# Patient Record
Sex: Male | Born: 1960 | Race: Black or African American | Hispanic: No | Marital: Single | Smoking: Never smoker
Health system: Southern US, Community
[De-identification: ages and names within clinical notes are randomized; demographics above are authoritative.]

## PROBLEM LIST (undated history)

## (undated) DIAGNOSIS — M86159 Other acute osteomyelitis, unspecified femur: Secondary | ICD-10-CM

## (undated) DIAGNOSIS — I38 Endocarditis, valve unspecified: Secondary | ICD-10-CM

## (undated) DIAGNOSIS — E119 Type 2 diabetes mellitus without complications: Secondary | ICD-10-CM

## (undated) DIAGNOSIS — I639 Cerebral infarction, unspecified: Secondary | ICD-10-CM

## (undated) DIAGNOSIS — I10 Essential (primary) hypertension: Secondary | ICD-10-CM

## (undated) DIAGNOSIS — T8459XA Infection and inflammatory reaction due to other internal joint prosthesis, initial encounter: Secondary | ICD-10-CM

## (undated) DIAGNOSIS — Z96649 Presence of unspecified artificial hip joint: Secondary | ICD-10-CM

## (undated) HISTORY — DX: Other acute osteomyelitis, unspecified femur: M86.159

## (undated) HISTORY — DX: Presence of unspecified artificial hip joint: Z96.649

## (undated) HISTORY — PX: NO PAST SURGERIES: SHX2092

## (undated) HISTORY — DX: Cerebral infarction, unspecified: I63.9

## (undated) HISTORY — DX: Infection and inflammatory reaction due to other internal joint prosthesis, initial encounter: T84.59XA

---

## 1998-04-02 ENCOUNTER — Emergency Department (HOSPITAL_COMMUNITY): Admission: EM | Admit: 1998-04-02 | Discharge: 1998-04-02 | Payer: Self-pay | Admitting: Emergency Medicine

## 1999-10-20 ENCOUNTER — Encounter: Payer: Self-pay | Admitting: Emergency Medicine

## 1999-10-20 ENCOUNTER — Emergency Department (HOSPITAL_COMMUNITY): Admission: EM | Admit: 1999-10-20 | Discharge: 1999-10-20 | Payer: Self-pay | Admitting: Emergency Medicine

## 2004-08-31 ENCOUNTER — Emergency Department (HOSPITAL_COMMUNITY): Admission: EM | Admit: 2004-08-31 | Discharge: 2004-08-31 | Payer: Self-pay | Admitting: Emergency Medicine

## 2005-09-20 ENCOUNTER — Encounter: Payer: Self-pay | Admitting: Emergency Medicine

## 2011-01-22 ENCOUNTER — Emergency Department (HOSPITAL_COMMUNITY)
Admission: EM | Admit: 2011-01-22 | Discharge: 2011-01-22 | Discharge status: Against Medical Advice | Payer: Self-pay | Attending: Emergency Medicine | Admitting: Emergency Medicine

## 2011-01-23 ENCOUNTER — Emergency Department (HOSPITAL_COMMUNITY)
Admission: EM | Admit: 2011-01-23 | Discharge: 2011-01-23 | Disposition: A | Discharge status: Home / Self Care | Payer: Self-pay | Attending: Emergency Medicine | Admitting: Emergency Medicine

## 2011-01-23 DIAGNOSIS — N471 Phimosis: Secondary | ICD-10-CM | POA: Insufficient documentation

## 2011-01-23 DIAGNOSIS — N509 Disorder of male genital organs, unspecified: Secondary | ICD-10-CM | POA: Insufficient documentation

## 2011-01-23 DIAGNOSIS — L293 Anogenital pruritus, unspecified: Secondary | ICD-10-CM | POA: Insufficient documentation

## 2013-04-07 ENCOUNTER — Emergency Department (HOSPITAL_COMMUNITY)
Admission: EM | Admit: 2013-04-07 | Discharge: 2013-04-08 | Discharge status: Against Medical Advice | Payer: Self-pay | Attending: Emergency Medicine | Admitting: Emergency Medicine

## 2013-04-07 DIAGNOSIS — Z049 Encounter for examination and observation for unspecified reason: Secondary | ICD-10-CM | POA: Insufficient documentation

## 2013-04-07 NOTE — ED Notes (Signed)
Called x 2 for triage, no answer 

## 2013-04-07 NOTE — ED Notes (Signed)
Called x1 for triage, no answer.

## 2014-02-22 ENCOUNTER — Encounter (HOSPITAL_COMMUNITY): Payer: Self-pay | Admitting: Emergency Medicine

## 2014-02-22 DIAGNOSIS — E119 Type 2 diabetes mellitus without complications: Secondary | ICD-10-CM | POA: Insufficient documentation

## 2014-02-22 DIAGNOSIS — Z79899 Other long term (current) drug therapy: Secondary | ICD-10-CM | POA: Insufficient documentation

## 2014-02-22 DIAGNOSIS — I10 Essential (primary) hypertension: Secondary | ICD-10-CM | POA: Insufficient documentation

## 2014-02-22 LAB — CBG MONITORING, ED: Glucose-Capillary: 175 mg/dL — ABNORMAL HIGH (ref 70–99)

## 2014-02-22 NOTE — ED Notes (Signed)
Pt reports he has been out of medications for 2 weeks. Pt c/o headache and sts he thinks his blood sugar is elevated.

## 2014-02-23 ENCOUNTER — Emergency Department (HOSPITAL_COMMUNITY)
Admission: EM | Admit: 2014-02-23 | Discharge: 2014-02-23 | Disposition: A | Discharge status: Home / Self Care | Payer: Self-pay | Attending: Emergency Medicine | Admitting: Emergency Medicine

## 2014-02-23 DIAGNOSIS — I10 Essential (primary) hypertension: Secondary | ICD-10-CM

## 2014-02-23 DIAGNOSIS — E119 Type 2 diabetes mellitus without complications: Secondary | ICD-10-CM

## 2014-02-23 HISTORY — DX: Type 2 diabetes mellitus without complications: E11.9

## 2014-02-23 HISTORY — DX: Essential (primary) hypertension: I10

## 2014-02-23 LAB — BASIC METABOLIC PANEL
Anion gap: 13 (ref 5–15)
BUN: 23 mg/dL (ref 6–23)
CO2: 25 mEq/L (ref 19–32)
Calcium: 8.7 mg/dL (ref 8.4–10.5)
Chloride: 101 mEq/L (ref 96–112)
Creatinine, Ser: 1.02 mg/dL (ref 0.50–1.35)
GFR calc Af Amer: >90 mL/min (ref >90)
GFR calc non Af Amer: 83 mL/min — ABNORMAL LOW (ref >90)
Glucose, Bld: 232 mg/dL — ABNORMAL HIGH (ref 70–99)
Potassium: 3.8 mEq/L (ref 3.7–5.3)
Sodium: 139 mEq/L (ref 137–147)

## 2014-02-23 LAB — CBC WITH DIFFERENTIAL/PLATELET
Basophils Absolute: 0 10*3/uL (ref 0.0–0.1)
Basophils Relative: 1 % (ref 0–1)
Eosinophils Absolute: 0.1 10*3/uL (ref 0.0–0.7)
Eosinophils Relative: 1 % (ref 0–5)
HCT: 38.5 % — ABNORMAL LOW (ref 39.0–52.0)
Hemoglobin: 12.6 g/dL — ABNORMAL LOW (ref 13.0–17.0)
Lymphocytes Relative: 20 % (ref 12–46)
Lymphs Abs: 0.9 10*3/uL (ref 0.7–4.0)
MCH: 27 pg (ref 26.0–34.0)
MCHC: 32.7 g/dL (ref 30.0–36.0)
MCV: 82.6 fL (ref 78.0–100.0)
Monocytes Absolute: 0.9 10*3/uL (ref 0.1–1.0)
Monocytes Relative: 19 % — ABNORMAL HIGH (ref 3–12)
Neutro Abs: 2.7 10*3/uL (ref 1.7–7.7)
Neutrophils Relative %: 59 % (ref 43–77)
Platelets: 179 10*3/uL (ref 150–400)
RBC: 4.66 MIL/uL (ref 4.22–5.81)
RDW: 13.8 % (ref 11.5–15.5)
WBC: 4.6 10*3/uL (ref 4.0–10.5)

## 2014-02-23 MED ORDER — METFORMIN HCL 500 MG PO TABS: 500.0000 mg | ORAL_TABLET | Freq: Two times a day (BID) | ORAL | Status: DC

## 2014-02-23 MED ORDER — LISINOPRIL 5 MG PO TABS: 20.0000 mg | ORAL_TABLET | Freq: Every day | ORAL | Status: DC

## 2014-02-23 MED ORDER — HYDROCHLOROTHIAZIDE 25 MG PO TABS: 25.0000 mg | ORAL_TABLET | Freq: Every day | ORAL | Status: DC

## 2014-02-23 NOTE — Discharge Instructions (Signed)
°Emergency Department Resource Guide °1) Find a Doctor and Pay Out of Pocket °Although you won't have to find out who is covered by your insurance plan, it is a good idea to ask around and get recommendations. You will then need to call the office and see if the doctor you have chosen will accept you as a new patient and what types of options they offer for patients who are self-pay. Some doctors offer discounts or will set up payment plans for their patients who do not have insurance, but you will need to ask so you aren't surprised when you get to your appointment. ° °2) Contact Your Local Health Department °Not all health departments have doctors that can see patients for sick visits, but many do, so it is worth a call to see if yours does. If you don't know where your local health department is, you can check in your phone book. The CDC also has a tool to help you locate your state's health department, and many state websites also have listings of all of their local health departments. ° °3) Find a Walk-in Clinic °If your illness is not likely to be very severe or complicated, you may want to try a walk in clinic. These are popping up all over the country in pharmacies, drugstores, and shopping centers. They're usually staffed by nurse practitioners or physician assistants that have been trained to treat common illnesses and complaints. They're usually fairly quick and inexpensive. However, if you have serious medical issues or chronic medical problems, these are probably not your best option. ° °No Primary Care Doctor: °- Call [facility] at  832-8000 - they can help you locate a primary care doctor that  accepts your insurance, provides certain services, etc. °- Physician Referral Service- 1-800-533-3463 ° °Chronic Pain Problems: °Organization         Address  Phone   Notes  °[facility]  (336) 297-2271 Patients need to be referred by their primary care doctor.  ° °Medication  Assistance: °Organization         Address  Phone   Notes  °[place] [facility] [address] °[place], [place] [address] (336) 641-8030 --Must be a resident of [place] °-- Must have NO insurance coverage whatsoever (no Medicaid/ Medicare, etc.) °-- The pt. MUST have a primary care doctor that directs their care regularly and follows them in the community °  °[facility]  (866) 331-1348   °[employer]  (888) 892-1162   ° °Agencies that provide inexpensive medical care: °Organization         Address  Phone   Notes  °[facility]  (336) 832-8035   °[facility]    (336) 832-7272   °[facility] [address] °[place], [place] [address] (336) 832-4777   °[facility] [address], °[place] (336) 271-4999   °[facility]    (336) 373-0678   °[facility]    (336) 272-1050   °[facility] ° [address], [place] Phone:  (336) 832-4444, Fax:  (336) 832-4440 Hours of Operation:  9 am - 6 pm, M-F.  Also accepts Medicaid/Medicare and self-pay.  °[facility] ° [address], [place] Phone: (336) 832-3150, Fax: (336) 832-3151. Hours of Operation:  8:30 am - 5:30 pm, M-F.  Also accepts Medicaid and self-pay.  °[facility] [place] [address], [place] Phone: (336) 878-6027   °[facility] [address], [place], [place] (336)723-1848, Ext. 123 Mondays & Thursdays: 7-9 AM.  First 15 patients are seen on a first come, first serve basis. °  ° °Medicaid-accepting [place] Providers: ° °Organization         Address  Phone   Notes  °[facility] [address], [place] (336) 641-2100 Also accepts self-pay patients.  °[facility] [address], [place] ° (336) 856-9996   °[facility] [address], [place]  (336) 288-8857   °[facility] [address] [place] [address], [place] (336) 299-7000   °Veita Bland [address], [place]  ° (336) 373-1557 Only accepts [place] Access Medicaid patients after they have their name applied to their card.  ° °Self-Pay (no insurance) in [place]: ° °Organization         Address  Phone   Notes  °Sickle Cell Patients, [facility] [address], [place] (336) 832-1970   °[facility] [address], [place] (336) 832-4400   °[facility] ° [address], [place] (336) 992-4800   °[facility]/Dr. Osei-Bonsu ° [address] [place] [address], [place] or [address], [place] (336) 841-8500 Phone number for both [place] and [place] locations is the same.  °[facility] [address], [place] (336) 299-0000   °[facility] [address] [place] [address], [place] or [address] (336) 852-7530 °(336) 878-2260   °[facility] [address], [place] (336) 350-1642, phone; (336) 294-5005, fax Sees patients 1st and 3rd Saturday of every month.  Must not qualify for public or private insurance (i.e. Medicaid, Medicare, [place] Health Choice, Veterans' Benefits) • Household income should be no more than 200% of the poverty level •The clinic cannot treat you if you are pregnant or think you are pregnant • Sexually transmitted diseases are not treated at the clinic.  ° ° °Dental Care: °Organization         Address  Phone  Notes  °[place] [facility] [address], [place] (336) 641-6152 Accepts children up to age 21 who are enrolled in Medicaid or [place] Health Choice; pregnant women with a Medicaid card; and children who have applied for Medicaid or [place] Health Choice, but were declined, whose parents can pay a reduced fee at time of service.  °[place]  [facility] [place]  [address], [place] (336) 641-7733 Accepts children up to age 21 who are enrolled in Medicaid or [place] Health Choice; pregnant women with a Medicaid card; and children who have applied for Medicaid or [place] Health Choice, but were declined, whose parents can pay a reduced fee at time of service.  °[facility] ° [address], [place] (336) 641-4533 Patients are seen by appointment only. Walk-ins are not accepted. [facility] will see patients 18 years of age and older. °Monday - Tuesday (8am-5pm) °Most Wednesdays (8:30-5pm) °$30 per visit, cash only  °[facility] ° [address], [place] (336) 641-4533 Patients are seen by appointment only. Walk-ins are not accepted. [facility] will see patients 18 years of age and older. °One   Wednesday Evening (Monthly: Volunteer Based).  $30 per visit, cash only  °[facility]  (919) 537-3737 for adults; Children under age 4, call Graduate Pediatric Dentistry at ([phone]) [phone]. Children aged 4-14, please call ([phone]) [phone] to request a pediatric application. ° Dental services are provided in all areas of dental care including fillings, crowns and bridges, complete and partial dentures, implants, gum treatment, root canals, and extractions. Preventive care is also provided. Treatment is provided to both adults and children. °Patients are selected via a lottery and there is often a waiting list. °  °[facility] [address], °[place] ° (336) 763-8833 [email or web address] °  °[facility] [address], [place], [place] (336)723-1848, Ext. 123 Second and Fourth Thursday of each month, opens at 6:30 AM; Clinic ends at 9 AM.  Patients are seen on a first-come first-served basis, and a limited number are seen during each clinic.  ° °[facility] ° [address], [place], [place] (336) 723-7904    Eligibility Requirements °You must have lived in [place], [place], or [place] counties for at least the last three months. °  You cannot be eligible for state or federal sponsored healthcare insurance, including Veterans Administration, Medicaid, or Medicare. °  You generally cannot be eligible for healthcare insurance through your employer.  °  How to apply: °Eligibility screenings are held every Tuesday and Wednesday afternoon from 1:00 pm until 4:00 pm. You do not need an appointment for the interview!  °[facility] [address], [place], [place] 336-631-2330   °[facility]  336-342-8273   °[facility]  336-703-3100   °[facility]  336-570-6415   ° °Behavioral Health Resources in the Community: °Intensive Outpatient Programs °Organization         Address  Phone  Notes  °[facility] [address], [place], [place] 336-878-6098   °[facility] [address], [place], [place] 336-832-9800   °[facility] [address], [place], [place] ° 336-882-2125   °[facility] [address],  °[place], [place] 1-800-853-5163 or 336-641-4981   °Substance Abuse Resources °Organization         Address  Phone  Notes  °[facility]  336-882-2125   °[facility]  336-784-9470   °[facility]  336-285-9073   °[facility]  336-845-3988   °Residential & Outpatient Substance Abuse Program  1-800-659-3381   °Psychological Services °Organization         Address  Phone  Notes  °[facility]  336- 832-9600   °[facility]  336- 378-7881   °[facility] [address], [place] 1-800-853-5163 or 336-641-4981   ° °Mobile Crisis Teams °Organization         Address  Phone  Notes  °[facility], Mobile Crisis Care Unit  1-877-626-1772   °Assertive °[facility] ° [address]  [place], [place] 336-834-9664   °Sharon DeEsch [address] °[place] 336-554-5454   ° °Self-Help/Support Groups °Organization         Address  Phone             Notes  °[facility] of  - variety of support groups  336- 373-1402 Call for more information  °Narcotics Anonymous (NA), [facility] [address], °[place]  2 meetings at this location  ° °  Residential Treatment Programs Organization         Address  Phone  Notes  [facility] [address],    [place]  [phone]   [facility]  [address], [place] [id number], [place], [place] [phone]   [facility] [address], [place] [phone] Admissions: 8am-3pm M-F  [facility] [address]    [place], [place] [phone]   [facility] [address], [place], [place] [phone]   [facility] [address]  [place], [place] [phone]   [facility] Intensive Outpatient [address], [place], [place] [phone]   [facility] ([facility] [address]  [place], [place] [phone] or [phone]   [facility] [address] [place], [place] [phone] Accepts Medicaid  [facility] [address]  [place] [phone]) [phone] Substance Abuse/Addiction Treatment   [facility] Organization         Address  Phone  Notes  [facility]  [phone](888) 862-320-1889   Angie FavaJulie Brannon, PhD [address], [address] [place], [place]   ([phone](336) [phone] or [phone](336) ([phone]   [facility]   [address] [place], [place] [phone](336) [phone]   [facility] [address], [place], [place] (718)[phone](336) [phone] Insurance/Medicaid/sponsorship through [facility] [address]                                    [place], [place] [phone](336) [phone] Therapy/tele-psych/case    [facility] [address]   [place], [place] [phone](336) [phone]    Dr. Lolly MustacheArfeen  [phone]) [phone]   [facility] [place] [facility]  [employer] [facility] 1) [address], [place] 2) [address], [place] 3)  [address], [place] [phone](336) ([phone](336) ([phone]  [phone](336) [phone]   [facility] Child Abuse Hotline [phone](336) [phone] or [phone]) [phone] (After Hours)       DASH Eating Plan DASH stands for "Dietary Approaches to Stop Hypertension." The DASH eating plan is a healthy eating plan that has been shown to reduce high blood pressure (hypertension). Additional health benefits may include reducing the risk of type 2 diabetes mellitus, heart disease, and stroke. The DASH eating plan may also help with weight loss. WHAT DO I NEED TO KNOW ABOUT THE DASH EATING PLAN? For the DASH eating plan, you will follow these general guidelines:  Choose foods with a percent daily value for sodium of less than 5% (as listed on the food label).  Use salt-free seasonings or herbs instead of table salt or sea salt.  Check with your health care provider or pharmacist before using salt substitutes.  Eat lower-sodium products, often labeled as "lower sodium" or "no salt added."  Eat fresh foods.  Eat more vegetables, fruits, and low-fat dairy products.  Choose whole grains. Look for the word "whole" as the first word in the ingredient list.  Choose fish and skinless chicken or [place] more often than red meat. Limit fish, poultry, and meat to 6 oz (170 g) each day.  Limit sweets, desserts, sugars, and sugary drinks.  Choose heart-healthy fats.  Limit cheese to 1 oz (28 g) per day.  Eat more home-cooked food and less restaurant, buffet, and fast food.  Limit fried foods.  Cook foods using methods other than frying.  Limit canned vegetables. If you do use them, rinse them well to decrease the sodium.  When eating at a restaurant, ask that your food be prepared with  less salt, or no salt if possible. WHAT FOODS CAN I EAT? Seek help from a dietitian for individual calorie needs. Grains Whole grain or whole wheat bread. Brown rice. Whole grain or whole wheat pasta. Quinoa, bulgur, and whole grain cereals. Low-sodium cereals. Corn or whole wheat flour tortillas. Whole grain cornbread. Whole grain crackers. Low-sodium crackers. Vegetables Fresh or frozen vegetables (raw, steamed, roasted, or grilled). Low-sodium or reduced-sodium tomato and vegetable juices. Low-sodium or reduced-sodium tomato sauce and paste. Low-sodium or reduced-sodium canned vegetables.  Fruits All fresh, canned (in natural juice), or frozen fruits. Meat and Other Protein Products Ground beef (85% or leaner), grass-fed beef, or beef trimmed of fat. Skinless chicken or [place]. Ground chicken or [place]. Pork trimmed of fat. All fish and seafood. Eggs. Dried beans, peas, or lentils. Unsalted nuts and seeds. Unsalted canned beans. Dairy Low-fat dairy products, such as skim or 1% milk, 2% or reduced-fat cheeses, low-fat ricotta or cottage cheese, or plain low-fat yogurt. Low-sodium or reduced-sodium cheeses. Fats and Oils Tub margarines without trans fats. Light or reduced-fat mayonnaise and salad dressings (reduced sodium). Avocado. Safflower, olive, or canola oils. Natural peanut or almond butter. Other Unsalted popcorn and pretzels. The items listed above may not be a complete list of recommended foods or beverages. Contact your dietitian for more options. WHAT FOODS ARE NOT RECOMMENDED? Grains White bread. White pasta. White rice. Refined cornbread. Bagels and croissants. Crackers that contain trans fat. Vegetables Creamed or fried vegetables. Vegetables in a cheese sauce. Regular canned vegetables. Regular canned tomato sauce and paste. Regular tomato and vegetable juices. Fruits Dried fruits. Canned fruit in light or heavy syrup. Fruit juice. Meat and Other Protein Products Fatty cuts  of meat. Ribs, chicken wings, bacon, sausage, bologna, salami, chitterlings, fatback, hot dogs, bratwurst, and packaged luncheon meats. Salted nuts and seeds. Canned beans with salt. Dairy Whole or 2% milk, cream, half-and-half, and cream cheese. Whole-fat or sweetened yogurt. Full-fat cheeses or blue cheese. Nondairy creamers and whipped toppings. Processed cheese, cheese spreads, or cheese curds. Condiments Onion and garlic salt, seasoned salt, table salt, and sea salt. Canned and packaged gravies. Worcestershire sauce. Tartar sauce. Barbecue sauce. Teriyaki sauce. Soy sauce, including reduced sodium. Steak sauce. Fish sauce. Oyster sauce. Cocktail sauce. Horseradish. Ketchup and mustard. Meat flavorings and tenderizers. Bouillon cubes. Hot sauce. Tabasco sauce. Marinades. Taco seasonings. Relishes. Fats and Oils Butter, stick margarine, lard, shortening, ghee, and bacon fat. Coconut, palm kernel, or palm oils. Regular salad dressings. Other Pickles and olives. Salted popcorn and pretzels. The items listed above may not be a complete list of foods and beverages to avoid. Contact your dietitian for more information. WHERE CAN I FIND MORE INFORMATION? National Heart, Lung, and Blood Institute: [employer] Document Released: 04/27/2011 Document Revised: 09/22/2013 Document Reviewed: 03/12/2013 [facility] Patient Information 2015 [place], [place]. This information is not intended to replace advice given to you by your health care provider. Make sure you discuss any questions you have with your health care provider.

## 2014-02-23 NOTE — ED Notes (Signed)
Pt states that he is unable to urinate.   

## 2014-02-23 NOTE — ED Notes (Signed)
The pt has had a headache for one week.  He has  Nor had any bp or diabetic meds for one month

## 2014-03-04 NOTE — ED Provider Notes (Signed)
CSN: [id number]     Arrival date & time 02/22/14  2316 History   First MD Initiated Contact with Patient 02/23/14 0044     Chief Complaint  Patient presents with  . Headache     (Consider location/radiation/quality/duration/timing/severity/associated sxs/prior Treatment) Patient is a 53 y.o. male presenting with headaches. The history is provided by the patient.  Headache Pain location:  Generalized Quality:  Dull Radiates to:  Does not radiate Onset quality:  Unable to specify Duration:  2 weeks Timing:  Intermittent Progression:  Waxing and waning Context: not activity, not exposure to bright light, not coughing and not loud noise   Associated symptoms: no blurred vision, no dizziness, no pain, no fever, no myalgias, no nausea, no neck pain, no neck stiffness, no numbness, no paresthesias, no photophobia, no seizures, no tingling and no vomiting   Risk factors: no anger     53yM with Ha. Gradual onset 2 weeks ago. No neuro complaints otherwise. Feels may be related to being out of bp and dm meds fro several weeks.   Past Medical History  Diagnosis Date  . Diabetes mellitus without complication   . Hypertension    History reviewed. No pertinent past surgical history. No family history on file. History  Substance Use Topics  . Smoking status: Former Games developermoker  . Smokeless tobacco: Not on file  . Alcohol Use: Yes    Review of Systems  Constitutional: Negative for fever.  Eyes: Negative for blurred vision, photophobia and pain.  Gastrointestinal: Negative for nausea and vomiting.  Musculoskeletal: Negative for myalgias, neck pain and neck stiffness.  Neurological: Positive for headaches. Negative for dizziness, seizures, numbness and paresthesias.    All systems reviewed and negative, other than as noted in HPI.   Allergies  Review of patient's allergies indicates no known allergies.  Home Medications   Prior to Admission medications   Medication Sig Start Date End  Date Taking? Authorizing Provider  hydrochlorothiazide (HYDRODIURIL) 25 MG tablet Take 1 tablet (25 mg total) by mouth daily. 02/23/14   Raeford RazorStephen Novie Maggio, MD  lisinopril (PRINIVIL,ZESTRIL) 5 MG tablet Take 4 tablets (20 mg total) by mouth daily. 02/23/14   Raeford RazorStephen Paitynn Mikus, MD  metFORMIN (GLUCOPHAGE) 500 MG tablet Take 1 tablet (500 mg total) by mouth 2 (two) times daily with a meal. 02/23/14   Raeford RazorStephen Joevanni Roddey, MD   BP 146/80  Pulse 97  Temp(Src) 98.3 F (36.8 C) (Oral)  Resp 20  SpO2 99% Physical Exam  Nursing note and vitals reviewed. Constitutional: He is oriented to person, place, and time. He appears well-developed and well-nourished. No distress.  HENT:  Head: Normocephalic and atraumatic.  Eyes: Conjunctivae are normal. Right eye exhibits no discharge. Left eye exhibits no discharge.  Neck: Neck supple.  No nuchal rigidty  Cardiovascular: Normal rate, regular rhythm and normal heart sounds.  Exam reveals no gallop and no friction rub.   No murmur heard. Pulmonary/Chest: Effort normal and breath sounds normal. No respiratory distress.  Abdominal: Soft. He exhibits no distension. There is no tenderness.  Musculoskeletal: He exhibits no edema and no tenderness.  Neurological: He is alert and oriented to person, place, and time. No cranial nerve deficit. He exhibits normal muscle tone. Coordination normal.  normal appearing gait  Skin: Skin is warm and dry.  Psychiatric: He has a normal mood and affect. His behavior is normal. Thought content normal.    ED Course  Procedures (including critical care time) Labs Review Labs Reviewed  BASIC METABOLIC PANEL -  Abnormal; Notable for the following:    Glucose, Bld 232 (*)    GFR calc non Af Amer 83 (*)    All other components within normal limits  CBC WITH DIFFERENTIAL - Abnormal; Notable for the following:    Hemoglobin 12.6 (*)    HCT 38.5 (*)    Monocytes Relative 19 (*)    All other components within normal limits  CBG MONITORING, ED  - Abnormal; Notable for the following:    Glucose-Capillary 175 (*)    All other components within normal limits    Imaging Review No results found.   EKG Interpretation   Date/Time:  Monday February 23 2014 02:15:31 EDT Ventricular Rate:  109 PR Interval:  150 QRS Duration: 78 QT Interval:  314 QTC Calculation: 423 R Axis:   82 Text Interpretation:  Sinus tachycardia Baseline wander in lead(s) V3 ED  PHYSICIAN INTERPRETATION AVAILABLE IN [facility] Confirmed by TEST,  Record ([id number]) on 02/25/2014 6:54:17 AM      MDM   Final diagnoses:  Essential hypertension  Type 2 diabetes mellitus without complication    53yM with mild HA. Suspect primary HA. Consider emergent secondary causes such as bleed, infectious or mass but doubt. There is no history of trauma. Pt has a nonfocal neurological exam. Afebrile and neck supple. No use of blood thinning medication. Consider ocular etiology such as acute angle closure glaucoma but doubt. Pt denies acute change in visual acuity and eye exam unremarkable. Doubt temporal arteritis given age, no temporal tenderness and temporal artery pulsations palpable. Doubt CO poisoning. No contacts with similar symptoms. Doubt venous thrombosis. Doubt carotid or vertebral arteries dissection. Feel that can be safely discharged, but strict return precautions discussed as well as stressing importance of continued routine medical care. Prescribed BP meds.  Outpt fu.     Raeford RazorStephen Reco Shonk, MD 03/04/14 [phone]

## 2014-03-05 ENCOUNTER — Encounter (HOSPITAL_COMMUNITY): Payer: Self-pay | Admitting: Emergency Medicine

## 2014-03-05 DIAGNOSIS — R000 Tachycardia, unspecified: Secondary | ICD-10-CM | POA: Insufficient documentation

## 2014-03-05 DIAGNOSIS — R569 Unspecified convulsions: Secondary | ICD-10-CM | POA: Insufficient documentation

## 2014-03-05 DIAGNOSIS — I10 Essential (primary) hypertension: Secondary | ICD-10-CM | POA: Insufficient documentation

## 2014-03-05 DIAGNOSIS — Z87891 Personal history of nicotine dependence: Secondary | ICD-10-CM | POA: Insufficient documentation

## 2014-03-05 DIAGNOSIS — I639 Cerebral infarction, unspecified: Secondary | ICD-10-CM | POA: Insufficient documentation

## 2014-03-05 DIAGNOSIS — E119 Type 2 diabetes mellitus without complications: Secondary | ICD-10-CM | POA: Insufficient documentation

## 2014-03-05 NOTE — ED Notes (Signed)
Pt. reports headache with blurred vision and mild dizziness onset today . Alert and oriented/ respirations unlabored /ambulatory.

## 2014-03-06 ENCOUNTER — Emergency Department (HOSPITAL_COMMUNITY): Payer: Self-pay

## 2014-03-06 ENCOUNTER — Encounter (HOSPITAL_COMMUNITY): Payer: Self-pay | Admitting: Radiology

## 2014-03-06 ENCOUNTER — Emergency Department (HOSPITAL_COMMUNITY)
Admission: EM | Admit: 2014-03-06 | Discharge: 2014-03-06 | Discharge status: Against Medical Advice | Payer: Self-pay | Attending: Emergency Medicine | Admitting: Emergency Medicine

## 2014-03-06 DIAGNOSIS — R253 Fasciculation: Secondary | ICD-10-CM

## 2014-03-06 DIAGNOSIS — H538 Other visual disturbances: Secondary | ICD-10-CM

## 2014-03-06 DIAGNOSIS — I639 Cerebral infarction, unspecified: Secondary | ICD-10-CM

## 2014-03-06 DIAGNOSIS — R569 Unspecified convulsions: Secondary | ICD-10-CM

## 2014-03-06 DIAGNOSIS — R51 Headache: Secondary | ICD-10-CM

## 2014-03-06 DIAGNOSIS — I10 Essential (primary) hypertension: Secondary | ICD-10-CM

## 2014-03-06 LAB — RAPID URINE DRUG SCREEN, HOSP PERFORMED
Amphetamines: NOT DETECTED
Barbiturates: NOT DETECTED
Benzodiazepines: NOT DETECTED
Cocaine: NOT DETECTED
Opiates: NOT DETECTED
TETRAHYDROCANNABINOL: NOT DETECTED

## 2014-03-06 LAB — BASIC METABOLIC PANEL
ANION GAP: 11 (ref 5–15)
BUN: 21 mg/dL (ref 6–23)
CHLORIDE: 99 meq/L (ref 96–112)
CO2: 28 meq/L (ref 19–32)
Calcium: 9.4 mg/dL (ref 8.4–10.5)
Creatinine, Ser: 1.1 mg/dL (ref 0.50–1.35)
GFR calc Af Amer: 88 mL/min — ABNORMAL LOW (ref >90)
GFR calc non Af Amer: 76 mL/min — ABNORMAL LOW (ref >90)
Glucose, Bld: 227 mg/dL — ABNORMAL HIGH (ref 70–99)
Potassium: 3.9 mEq/L (ref 3.7–5.3)
Sodium: 138 mEq/L (ref 137–147)

## 2014-03-06 LAB — CBC WITH DIFFERENTIAL/PLATELET
Basophils Absolute: 0.2 10*3/uL — ABNORMAL HIGH (ref 0.0–0.1)
Basophils Relative: 2 % — ABNORMAL HIGH (ref 0–1)
EOS PCT: 2 % (ref 0–5)
Eosinophils Absolute: 0.2 10*3/uL (ref 0.0–0.7)
HCT: 35.8 % — ABNORMAL LOW (ref 39.0–52.0)
HEMOGLOBIN: 11.8 g/dL — AB (ref 13.0–17.0)
Lymphocytes Relative: 23 % (ref 12–46)
Lymphs Abs: 2.6 10*3/uL (ref 0.7–4.0)
MCH: 27.8 pg (ref 26.0–34.0)
MCHC: 33 g/dL (ref 30.0–36.0)
MCV: 84.2 fL (ref 78.0–100.0)
MONO ABS: 1.3 10*3/uL — AB (ref 0.1–1.0)
Monocytes Relative: 12 % (ref 3–12)
NEUTROS PCT: 61 % (ref 43–77)
Neutro Abs: 6.9 10*3/uL (ref 1.7–7.7)
Platelets: 296 10*3/uL (ref 150–400)
RBC: 4.25 MIL/uL (ref 4.22–5.81)
RDW: 13.9 % (ref 11.5–15.5)
WBC: 11.2 10*3/uL — ABNORMAL HIGH (ref 4.0–10.5)

## 2014-03-06 LAB — URINALYSIS, ROUTINE W REFLEX MICROSCOPIC
Bilirubin Urine: NEGATIVE
Glucose, UA: >1000 mg/dL — AB
Ketones, ur: NEGATIVE mg/dL
LEUKOCYTES UA: NEGATIVE
NITRITE: NEGATIVE
PH: 5.5 (ref 5.0–8.0)
Protein, ur: NEGATIVE mg/dL
SPECIFIC GRAVITY, URINE: 1.027 (ref 1.005–1.030)
Urobilinogen, UA: 1 mg/dL (ref 0.0–1.0)

## 2014-03-06 LAB — URINE MICROSCOPIC-ADD ON

## 2014-03-06 LAB — ETHANOL: Alcohol, Ethyl (B): <11 mg/dL (ref 0–11)

## 2014-03-06 MED ORDER — ASPIRIN 81 MG PO CHEW: 324.0000 mg | CHEWABLE_TABLET | Freq: Every day | ORAL | Status: DC

## 2014-03-06 MED ORDER — LORAZEPAM 2 MG/ML IJ SOLN
INTRAMUSCULAR | Status: DC
Start: 2014-03-06 — End: 2014-03-06
  Filled 2014-03-06: qty 1

## 2014-03-06 MED ORDER — SODIUM CHLORIDE 0.9 % IV BOLUS (SEPSIS)
1000.0000 mL | Freq: Once | INTRAVENOUS | Status: AC
  Administered 2014-03-06: 1000 mL via INTRAVENOUS

## 2014-03-06 MED ORDER — LEVETIRACETAM 500 MG PO TABS: 500.0000 mg | ORAL_TABLET | Freq: Two times a day (BID) | ORAL | Status: DC

## 2014-03-06 MED ORDER — LABETALOL HCL 5 MG/ML IV SOLN
10.0000 mg | Freq: Once | INTRAVENOUS | Status: AC
  Administered 2014-03-06: 10 mg via INTRAVENOUS
  Filled 2014-03-06: qty 4

## 2014-03-06 MED ORDER — LEVETIRACETAM IN NACL 1000 MG/100ML IV SOLN
1000.0000 mg | Freq: Once | INTRAVENOUS | Status: AC
  Administered 2014-03-06: 1000 mg via INTRAVENOUS
  Filled 2014-03-06: qty 100

## 2014-03-06 MED ORDER — GADOBENATE DIMEGLUMINE 529 MG/ML IV SOLN
20.0000 mL | Freq: Once | INTRAVENOUS | Status: AC
  Administered 2014-03-06: 18 mL via INTRAVENOUS

## 2014-03-06 MED ORDER — LORAZEPAM 2 MG/ML IJ SOLN
1.0000 mg | Freq: Once | INTRAMUSCULAR | Status: AC
  Administered 2014-03-06: 1 mg via INTRAVENOUS

## 2014-03-06 NOTE — ED Notes (Signed)
MD at bedside. 

## 2014-03-06 NOTE — Discharge Instructions (Signed)
Take your BP meds as prescribed.   Take ASA 325 mg daily.   Take keppra as prescribed.   [facility].   Return to ER if you have seizures, worse headaches, weakness.

## 2014-03-06 NOTE — Progress Notes (Signed)
  CARE MANAGEMENT ED NOTE 03/06/2014  Patient:  Marc [name],Marc [name]   Account Number:  [id number]  Date Initiated:  03/06/2014  Documentation initiated by:  Ferdinand CavaSCHETTINO,Aleka Twitty  Subjective/Objective Assessment:   53 yo male presenting to the ED with c/o headache     Subjective/Objective Assessment Detail:     Action/Plan:   Patient [facility] and prescription refills if unable to access the system today he will contacte this CM [facility]   Action/Plan Detail:   Anticipated DC Date:       Status Recommendation to Physician:   Result of Recommendation:  Agreed    DC Planning Services  CM consult  Medication Assistance  PCP issues    Choice offered to / List presented to:            Status of service:  Completed, signed off  ED Comments:   ED Comments Detail:  CM consulted to assist with medications. This CM spoke with the patient regarding ED visit. He stated he was having headaches and then while he was in the ED they informed him that he had a seizure and are going to start him on medications. The patient stated he was released from prison in July and has not had medical care since. The patient confirmed his homeless status, no income, and no current medical provider. The patient is [facility]. This CM spoke with the patient [facility] and discussed receiving medical care there and then [facility]. This CM also [facility] with the patient. The patient stated that when he gets discharged he will [facility] to bee seen by the NP and get his medications filled for free. This CM provided contact information that if the patient [facility] today he is to call and then he will [facility] to [facility] his initial medications and he states that he has the $9 copay. The patient discussed his alcohol use with this CM  and we discussed treatment options and this CM provided the patient with a resource guide for treatment centers. The patient verbalized understanding and had no other questions or concerns and appreciative of assistance. ED staff RN and EDP aware of plan and information provided.

## 2014-03-06 NOTE — ED Provider Notes (Signed)
CSN: [id number]     Arrival date & time 03/05/14  2314 History   First MD Initiated Contact with Patient 03/06/14 0002     Chief Complaint  Patient presents with  . Headache     (Consider location/radiation/quality/duration/timing/severity/associated sxs/prior Treatment) Patient is a 53 y.o. male presenting with headaches.  Headache Pain location:  Generalized Quality:  Dull Radiates to:  Does not radiate Onset quality:  Gradual Duration:  1 day Timing:  Constant Progression:  Unchanged Chronicity:  Recurrent Similar to prior headaches: yes   Context comment:  With L eye blurring Relieved by:  Nothing Worsened by:  Nothing tried Ineffective treatments:  None tried Associated symptoms: no abdominal pain, no loss of balance, no numbness, no sinus pressure and no weakness     Past Medical History  Diagnosis Date  . Diabetes mellitus without complication   . Hypertension    History reviewed. No pertinent past surgical history. No family history on file. History  Substance Use Topics  . Smoking status: Former Games developermoker  . Smokeless tobacco: Not on file  . Alcohol Use: Yes    Review of Systems  HENT: Negative for sinus pressure.   Gastrointestinal: Negative for abdominal pain.  Neurological: Positive for headaches. Negative for numbness and loss of balance.  All other systems reviewed and are negative.     Allergies  Review of patient's allergies indicates no known allergies.  Home Medications   Prior to Admission medications   Medication Sig Start Date End Date Taking? Authorizing Provider  hydrochlorothiazide (HYDRODIURIL) 25 MG tablet Take 1 tablet (25 mg total) by mouth daily. 02/23/14   Raeford RazorStephen Kohut, MD  lisinopril (PRINIVIL,ZESTRIL) 5 MG tablet Take 4 tablets (20 mg total) by mouth daily. 02/23/14   Raeford RazorStephen Kohut, MD  metFORMIN (GLUCOPHAGE) 500 MG tablet Take 1 tablet (500 mg total) by mouth 2 (two) times daily with a meal. 02/23/14   Raeford RazorStephen Kohut, MD   BP  104/60  Pulse 97  Temp(Src) 99.6 F (37.6 C) (Oral)  Resp 14  Ht 5\' 10"  (1.778 m)  Wt 190 lb (86.183 kg)  BMI 27.26 kg/m2  SpO2 85% Physical Exam  Vitals reviewed. Constitutional: He is oriented to person, place, and time. He appears well-developed and well-nourished.  HENT:  Head: Normocephalic and atraumatic.  Eyes: Conjunctivae and EOM are normal.  Neck: Normal range of motion. Neck supple.  Cardiovascular: Regular rhythm and normal heart sounds.  Tachycardia present.   Pulmonary/Chest: Effort normal and breath sounds normal. No respiratory distress.  Abdominal: He exhibits no distension. There is no tenderness. There is no rebound and no guarding.  Musculoskeletal: Normal range of motion.  Neurological: He is alert and oriented to person, place, and time. He has normal strength and normal reflexes. No cranial nerve deficit or sensory deficit. Gait normal. GCS eye subscore is 4. GCS verbal subscore is 5. GCS motor subscore is 6.  Skin: Skin is warm and dry.    ED Course  Procedures (including critical care time) Labs Review Labs Reviewed  CBC WITH DIFFERENTIAL - Abnormal; Notable for the following:    WBC 11.2 (*)    Hemoglobin 11.8 (*)    HCT 35.8 (*)    Basophils Relative 2 (*)    Monocytes Absolute 1.3 (*)    Basophils Absolute 0.2 (*)    All other components within normal limits  BASIC METABOLIC PANEL - Abnormal; Notable for the following:    Glucose, Bld 227 (*)    GFR calc  non Af Amer 76 (*)    GFR calc Af Amer 88 (*)    All other components within normal limits  URINALYSIS, ROUTINE W REFLEX MICROSCOPIC - Abnormal; Notable for the following:    Glucose, UA >1000 (*)    Hgb urine dipstick MODERATE (*)    All other components within normal limits  URINE MICROSCOPIC-ADD ON  ETHANOL  URINE RAPID DRUG SCREEN (HOSP PERFORMED)    Imaging Review Dg Chest 2 View  03/06/2014   CLINICAL DATA:  Chest pain and elevated blood pressure.  EXAM: CHEST  2 VIEW  COMPARISON:   None.  FINDINGS: Normal heart size and mediastinal contours. No acute infiltrate or edema. No effusion or pneumothorax. No acute osseous findings.  IMPRESSION: No active cardiopulmonary disease.   Electronically Signed   By: Tiburcio PeaJonathan  Watts M.D.   On: 03/06/2014 01:43   Ct Head Wo Contrast  03/06/2014   CLINICAL DATA:  Elevated blood pressure with headache. Initial encounter.  EXAM: CT HEAD WITHOUT CONTRAST  TECHNIQUE: Contiguous axial images were obtained from the base of the skull through the vertex without intravenous contrast.  COMPARISON:  None.  FINDINGS: The examination is degraded secondary to patient motion necessitating the acquisition of additional images.  Bilateral basal ganglial calcifications. Gray-white differentiation is otherwise well maintained given limitations of the examination. No CT evidence of acute large territory infarct. No intraparenchymal or extra-axial mass or hemorrhage. Normal size and configuration of the ventricles and basilar cisterns. No midline shift.  Mucosal thickening involving the bilateral frontal, anterior and posterior ethmoidal air cells. Polypoid mucosal thickening of the right maxillary. Normal aeration of the bilateral mastoid air cells. No air-fluid levels. No definite displaced calvarial fracture. Regional soft tissues appear normal.  IMPRESSION: 1. Degraded examination without definite acute intracranial process. 2. Sinus disease as above.  No air-fluid levels.   Electronically Signed   By: Simonne ComeJohn  Watts M.D.   On: 03/06/2014 02:01     EKG Interpretation   Date/Time:  Friday March 06 2014 00:40:45 EDT Ventricular Rate:  109 PR Interval:  155 QRS Duration: 81 QT Interval:  322 QTC Calculation: 434 R Axis:   56 Text Interpretation:  Sinus tachycardia No significant change was found  Confirmed by Mirian MoGentry, Matthew [phone]([id number]) on 03/06/2014 1:10:50 AM      MDM   Final diagnoses:  None    53 y.o. male with pertinent PMH of DM, HTN presents with  recurrent ha and new onset L eye blurring earlier this morning lasting approximately 30 minutes which is now resolved.  He presents with a complaint of request for blood pressure medications, despite having a prescription for same.  On arrival vital signs and physical exam as above, significant for hypertension.  No focal neurodeficits on initial exam.  Pt asymptomatic at time of exam.    Pt went to CT scan, there had a brief (<1 min) episode of unresponsiveness and L sided shaking.  L sided tremor persisted after resolution of alertness and throughout the rest of his visit.  He also had L sided weakness.  Consulted neurology and pt was to be admitted, however he refused.  He was to obtain MRI per neurology recs.  Keppra 1 gm given with plan to dc home with 500mg  keppra bid.  Pt care to Dr. Silverio LayYao pending MRI  No diagnosis found.      Mirian MoMatthew Gentry, MD 03/06/14 [phone]

## 2014-03-06 NOTE — ED Provider Notes (Signed)
  Physical Exam  BP 143/91  Pulse 97  Temp(Src) 99.6 F (37.6 C) (Oral)  Resp 14  Ht 5\' 10"  (1.778 m)  Wt 190 lb (86.183 kg)  BMI 27.26 kg/m2  SpO2 100%  Physical Exam  ED Course  Procedures  Care assumed at sign out from Dr. Littie DeedsGentry. Patient has been having headache and blurry vision and possible seizure activity. Loaded with keppra. Neuro was consulted, wanted MRI. MRI showed possible acute stroke. Dr. Amada JupiterKirkpatrick feels that its likely to be post seizure changes but still wants to start ASA. He also wants to admit for seizure and stroke workup. However, patient adamant that he wants to go home. He understands the risks including disability and death. Will AMA patient. Will start keppra, ASA, BP meds.       Richardean Canalavid H Debbrah Sampedro, MD 03/06/14 [phone]

## 2014-03-06 NOTE — Progress Notes (Signed)
This CM received phone message form the patient that [facility] medication assistance after the ED discharge. He stated that they were able to fill both of the prescriptions for him for $4.00. The patient does not have a return contact number so unable for further follow up. Ferdinand CavaAndrea Schettino, RN, BSN, Case Manager [phone] 03/06/2014 1:49 PM

## 2014-03-06 NOTE — Consult Note (Signed)
Consult Reason for Consult: concern for new onset seizure Referring Physician: Dr Littie Deeds  CC: Left sided twitching  HPI: Marc [name] is an 53 y.o. male presenting for evaluation of recurrent headache with transient ( ) episode of blurring vision in his left eye. Headache described as frontal pressure type pain. No associated nausea or emesis. No recent fevers or illnesses. No neck stiffness. Symptoms had resolved and he was on his way to CT when he became unresponsive for around 10 seconds and had around 1 minute of left sided twitching. Slightly confused post event. Upon arrival the ED physician noted mild left hand weakness and intermittent twitching of his LUE. Patient given 1mg  ativan and symptoms resolved.   No prior seizure history. No prior CVA or TIA. + EtOH usage, reports having 2 beers yesterday, will occasionally drink a 6 pack. Denies drug use.    Past Medical History  Diagnosis Date  . Diabetes mellitus without complication   . Hypertension     History reviewed. No pertinent past surgical history.  No family history on file.  Social History:  reports that he has quit smoking. He does not have any smokeless tobacco history on file. He reports that he drinks alcohol. He reports that he does not use illicit drugs.  No Known Allergies  Medications: Prior to Admission:  (Not in a hospital admission)  ROS: Out of a complete 14 system review, the patient complains of only the following symptoms, and all other reviewed systems are negative.  Physical Examination: Blood pressure 158/82, pulse 108, temperature 99.6 F (37.6 C), temperature source Oral, resp. rate 22, height 5\' 10"  (1.778 m), weight 86.183 kg (190 lb), SpO2 95.00%.  Neurologic Examination Mental Status: Alert, oriented, thought content appropriate.  Speech fluent without evidence of aphasia.  Able to follow 3 step commands without difficulty. Cranial Nerves: II: funduscopic exam wnl bilaterally,  visual fields grossly normal, pupils equal, round, reactive to light and accommodation III,IV, VI: ptosis not present, extra-ocular motions intact bilaterally V,VII: smile symmetric, facial light touch sensation normal bilaterally VIII: hearing normal bilaterally IX,X: gag reflex present XI: trapezius strength/neck flexion strength normal bilaterally XII: tongue strength normal  Motor: Right : Upper extremity    Left:     Upper extremity 5/5 deltoid       5/5 deltoid 5/5 biceps      5/5 biceps  5/5 triceps      5/5 triceps 5/5wrist flexion     5/5 wrist flexion 5/5 wrist extension     5/5 wrist extension 5/5 hand grip      5/5 hand grip  Lower extremity     Lower extremity 5/5 hip flexor      5-/5 hip flexor 5/5 quadricep      5-/5 quadriceps  5/5 hamstrings     5/5 hamstrings 5/5 plantar flexion       5/5 plantar flexion 5/5 plantar extension     5/5 plantar extension *brief intermittent twitching episodes of LUE, lasting few seconds with no alteration or loss of consciousness Tone and bulk:normal tone throughout; no atrophy noted Sensory: Pinprick and light touch intact throughout, bilaterally Deep Tendon Reflexes: 2+ and symmetric throughout Plantars: Right: downgoing   Left: downgoing Cerebellar: normal finger-to-nose, normal rapid alternating movements and normal heel-to-shin test Gait: normal gait and station  Laboratory Studies:   Basic Metabolic Panel:  Recent Labs Lab 03/06/14 0028  NA 138  K 3.9  CL 99  CO2 28  GLUCOSE 227*  BUN  21  CREATININE 1.10  CALCIUM 9.4    Liver Function Tests: No results found for this basename: AST, ALT, ALKPHOS, BILITOT, PROT, ALBUMIN,  in the last 168 hours No results found for this basename: LIPASE, AMYLASE,  in the last 168 hours No results found for this basename: AMMONIA,  in the last 168 hours  CBC:  Recent Labs Lab 03/06/14 0028  WBC 11.2*  NEUTROABS 6.9  HGB 11.8*  HCT 35.8*  MCV 84.2  PLT 296    Cardiac  Enzymes: No results found for this basename: CKTOTAL, CKMB, CKMBINDEX, TROPONINI,  in the last 168 hours  BNP: No components found with this basename: POCBNP,   CBG: No results found for this basename: GLUCAP,  in the last 168 hours  Microbiology: No results found for this or any previous visit.  Coagulation Studies: No results found for this basename: LABPROT, INR,  in the last 72 hours  Urinalysis: No results found for this basename: COLORURINE, APPERANCEUR, LABSPEC, PHURINE, GLUCOSEU, HGBUR, BILIRUBINUR, KETONESUR, PROTEINUR, UROBILINOGEN, NITRITE, LEUKOCYTESUR,  in the last 168 hours  Lipid Panel:  No results found for this basename: chol, trig, hdl, cholhdl, vldl, ldlcalc    HgbA1C:  No results found for this basename: HGBA1C    Urine Drug Screen:   No results found for this basename: labopia, cocainscrnur, labbenz, amphetmu, thcu, labbarb    Alcohol Level: No results found for this basename: ETH,  in the last 168 hours  Other results:  Imaging: Dg Chest 2 View  03/06/2014   CLINICAL DATA:  Chest pain and elevated blood pressure.  EXAM: CHEST  2 VIEW  COMPARISON:  None.  FINDINGS: Normal heart size and mediastinal contours. No acute infiltrate or edema. No effusion or pneumothorax. No acute osseous findings.  IMPRESSION: No active cardiopulmonary disease.   Electronically Signed   By: Tiburcio PeaJonathan  Watts M.D.   On: 03/06/2014 01:43   Ct Head Wo Contrast  03/06/2014   CLINICAL DATA:  Elevated blood pressure with headache. Initial encounter.  EXAM: CT HEAD WITHOUT CONTRAST  TECHNIQUE: Contiguous axial images were obtained from the base of the skull through the vertex without intravenous contrast.  COMPARISON:  None.  FINDINGS: The examination is degraded secondary to patient motion necessitating the acquisition of additional images.  Bilateral basal ganglial calcifications. Gray-white differentiation is otherwise well maintained given limitations of the examination. No CT  evidence of acute large territory infarct. No intraparenchymal or extra-axial mass or hemorrhage. Normal size and configuration of the ventricles and basilar cisterns. No midline shift.  Mucosal thickening involving the bilateral frontal, anterior and posterior ethmoidal air cells. Polypoid mucosal thickening of the right maxillary. Normal aeration of the bilateral mastoid air cells. No air-fluid levels. No definite displaced calvarial fracture. Regional soft tissues appear normal.  IMPRESSION: 1. Degraded examination without definite acute intracranial process. 2. Sinus disease as above.  No air-fluid levels.   Electronically Signed   By: Simonne ComeJohn  Watts M.D.   On: 03/06/2014 02:01     Assessment/Plan:  53y/o gentleman presenting for evaluation of headache with transient blurred vision in his left eye. Symptoms resolved, while on way to CT had LOC with left sided twitching for <1 minute. Had further brief episodes of left sided twitching with no LOC. Noted to have mild left sided weakness after event which slowly resolved.   1)Load with keppra 1000mg  for continued twitching episodes 2)Check MRI brain with and without contrast  3)EEG 4)Check U tox and EtOH level 5)Seizure precautions  *  discussed plan with patient who reports he is not willing to be admitted. Counseled patient on importance of further evaluation but he does not wish to be admitted. Discussed with ED physician. Will have MRI brain completed in ED. Due to continued episodes will send out on keppra 500mg  BID. Follow up with outpatient neurology.   Elspeth Choeter Milly Goggins, DO [facility] [phone]  If 7pm- 7am, please page neurology on call as listed in AMION. 03/06/2014, 3:07 AM

## 2014-03-06 NOTE — ED Notes (Signed)
Patient transported to X-ray 

## 2014-03-06 NOTE — ED Notes (Signed)
Dr. Yao at the bedside. 

## 2014-03-06 NOTE — ED Notes (Signed)
Received call from the radiologist technician and she reported that the patient lost consciousness for a few seconds followed by left arm and leg twitching that lasted approximately one minute. Upon this RNs arrival pt was talking and could tell me todays date with the wrong year. Pt alert to self, situation and place. Pt had mild left hand grip weakness. See complete NIH. MD made aware and instructed to give 1mg  of ativan.

## 2014-03-22 ENCOUNTER — Emergency Department (HOSPITAL_COMMUNITY)
Admission: EM | Admit: 2014-03-22 | Discharge: 2014-03-22 | Discharge status: Against Medical Advice | Payer: Self-pay | Attending: Emergency Medicine | Admitting: Emergency Medicine

## 2014-03-22 DIAGNOSIS — Z5329 Procedure and treatment not carried out because of patient's decision for other reasons: Secondary | ICD-10-CM | POA: Insufficient documentation

## 2014-03-22 DIAGNOSIS — E119 Type 2 diabetes mellitus without complications: Secondary | ICD-10-CM | POA: Insufficient documentation

## 2014-03-22 DIAGNOSIS — Z87891 Personal history of nicotine dependence: Secondary | ICD-10-CM | POA: Insufficient documentation

## 2014-03-22 DIAGNOSIS — I10 Essential (primary) hypertension: Secondary | ICD-10-CM | POA: Insufficient documentation

## 2014-04-09 ENCOUNTER — Encounter (HOSPITAL_COMMUNITY): Payer: Self-pay | Admitting: Emergency Medicine

## 2014-04-09 ENCOUNTER — Emergency Department (HOSPITAL_COMMUNITY)
Admission: EM | Admit: 2014-04-09 | Discharge: 2014-04-09 | Discharge status: Against Medical Advice | Payer: Self-pay | Attending: Emergency Medicine | Admitting: Emergency Medicine

## 2014-04-09 DIAGNOSIS — I10 Essential (primary) hypertension: Secondary | ICD-10-CM | POA: Insufficient documentation

## 2014-04-09 DIAGNOSIS — E119 Type 2 diabetes mellitus without complications: Secondary | ICD-10-CM | POA: Insufficient documentation

## 2014-04-09 NOTE — ED Notes (Signed)
Pt. reports elevated blood pressure for several days .

## 2014-10-05 ENCOUNTER — Emergency Department (HOSPITAL_COMMUNITY)
Admission: EM | Admit: 2014-10-05 | Discharge: 2014-10-05 | Disposition: A | Discharge status: Home / Self Care | Payer: Self-pay | Attending: Emergency Medicine | Admitting: Emergency Medicine

## 2014-10-05 ENCOUNTER — Encounter (HOSPITAL_COMMUNITY): Payer: Self-pay | Admitting: Nurse Practitioner

## 2014-10-05 DIAGNOSIS — E119 Type 2 diabetes mellitus without complications: Secondary | ICD-10-CM | POA: Insufficient documentation

## 2014-10-05 DIAGNOSIS — Y998 Other external cause status: Secondary | ICD-10-CM | POA: Insufficient documentation

## 2014-10-05 DIAGNOSIS — Z87891 Personal history of nicotine dependence: Secondary | ICD-10-CM | POA: Insufficient documentation

## 2014-10-05 DIAGNOSIS — Y9289 Other specified places as the place of occurrence of the external cause: Secondary | ICD-10-CM | POA: Insufficient documentation

## 2014-10-05 DIAGNOSIS — I10 Essential (primary) hypertension: Secondary | ICD-10-CM | POA: Insufficient documentation

## 2014-10-05 DIAGNOSIS — Z79899 Other long term (current) drug therapy: Secondary | ICD-10-CM | POA: Insufficient documentation

## 2014-10-05 DIAGNOSIS — Z7982 Long term (current) use of aspirin: Secondary | ICD-10-CM | POA: Insufficient documentation

## 2014-10-05 DIAGNOSIS — Y9389 Activity, other specified: Secondary | ICD-10-CM | POA: Insufficient documentation

## 2014-10-05 DIAGNOSIS — S60551A Superficial foreign body of right hand, initial encounter: Secondary | ICD-10-CM | POA: Insufficient documentation

## 2014-10-05 DIAGNOSIS — L089 Local infection of the skin and subcutaneous tissue, unspecified: Secondary | ICD-10-CM | POA: Insufficient documentation

## 2014-10-05 DIAGNOSIS — X58XXXA Exposure to other specified factors, initial encounter: Secondary | ICD-10-CM | POA: Insufficient documentation

## 2014-10-05 MED ORDER — LIDOCAINE HCL (PF) 1 % IJ SOLN
5.0000 mL | Freq: Once | INTRAMUSCULAR | Status: AC
  Administered 2014-10-05: 5 mL via INTRADERMAL
  Filled 2014-10-05: qty 5

## 2014-10-05 MED ORDER — CEPHALEXIN 500 MG PO CAPS: 500.0000 mg | ORAL_CAPSULE | Freq: Four times a day (QID) | ORAL | Status: DC

## 2014-10-05 MED ORDER — TRAMADOL HCL 50 MG PO TABS: 50.0000 mg | ORAL_TABLET | Freq: Four times a day (QID) | ORAL | Status: DC | PRN

## 2014-10-05 MED ORDER — CEPHALEXIN 250 MG PO CAPS
500.0000 mg | ORAL_CAPSULE | Freq: Once | ORAL | Status: AC
  Administered 2014-10-05: 500 mg via ORAL
  Filled 2014-10-05: qty 2

## 2014-10-05 NOTE — ED Notes (Signed)
Declined W/C at D/C and was escorted to lobby by RN. 

## 2014-10-05 NOTE — Discharge Instructions (Signed)
Take the prescribed medication as directed.  Make sure to keep hand clean and dry. Follow-up with Dr. Janee Mornhompson. Return to the ED for new or worsening symptoms-- increased swelling, redness, drainage, high fever, etc.

## 2014-10-05 NOTE — ED Provider Notes (Signed)
CSN: [id number]     Arrival date & time 10/05/14  1544 History  This chart was scribed for non-physician provider Sharilyn SitesLisa Sanders, PA-C, working with Mancel BaleElliott Wentz, MD by Phillis HaggisGabriella Gaje, ED Scribe. This patient was seen in room TR10C/TR10C and patient care was started at 4:09 PM.   Chief Complaint  Patient presents with  . Hand Pain   The history is provided by the patient. No language interpreter was used.    HPI Comments: Marc [name] is a 54 y.o. male who presents to the Emergency Department complaining of right hand pain onset one day ago. 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  States hand has become increasingly painful and is starting to swell.  He states that his last tdap was last year. He denies numbness, tingling, or fever. No drainage from hand.  No other wounds noted.  Past Medical History  Diagnosis Date  . Diabetes mellitus without complication   . Hypertension    History reviewed. No pertinent past surgical history. History reviewed. No pertinent family history. History  Substance Use Topics  . Smoking status: Former Games developermoker  . Smokeless tobacco: Not on file  . Alcohol Use: Yes    Review of Systems  Constitutional: Negative for fever.  Skin: Positive for wound.  Neurological: Negative for weakness and numbness.  All other systems reviewed and are negative.  Allergies  Review of patient's allergies indicates no known allergies.  Home Medications   Prior to Admission medications   Medication Sig Start Date End Date Taking? Authorizing Provider  aspirin 81 MG chewable tablet Chew 4 tablets (324 mg total) by mouth daily. 03/06/14   Richardean Canalavid H Yao, MD  hydrochlorothiazide (HYDRODIURIL) 25 MG tablet Take 1 tablet (25 mg total) by mouth daily. 02/23/14   Raeford RazorStephen Kohut, MD  levETIRAcetam (KEPPRA) 500 MG tablet Take 1 tablet (500 mg total) by mouth 2 (two) times daily. 03/06/14   Richardean Canalavid H Yao, MD   lisinopril (PRINIVIL,ZESTRIL) 5 MG tablet Take 4 tablets (20 mg total) by mouth daily. 02/23/14   Raeford RazorStephen Kohut, MD  metFORMIN (GLUCOPHAGE) 500 MG tablet Take 1 tablet (500 mg total) by mouth 2 (two) times daily with a meal. 02/23/14   Raeford RazorStephen Kohut, MD   BP 176/97 mmHg  Pulse 113  Temp(Src) 98.4 F (36.9 C) (Oral)  Resp 16  Ht 5\' 11"  (1.803 m)  Wt 196 lb 8 oz (89.132 kg)  BMI 27.42 kg/m2  SpO2 98%  Physical Exam  Constitutional: He is oriented to person, place, and time. He appears well-developed and well-nourished.  HENT:  Head: Normocephalic and atraumatic.  Mouth/Throat: Oropharynx is clear and moist.  Eyes: Conjunctivae and EOM are normal. Pupils are equal, round, and reactive to light.  Neck: Normal range of motion.  Cardiovascular: Normal rate, regular rhythm and normal heart sounds.   Pulmonary/Chest: Effort normal and breath sounds normal. No respiratory distress. He has no wheezes.  Abdominal: Soft. Bowel sounds are normal.  Musculoskeletal: Normal range of motion.       Right hand: He exhibits tenderness and swelling. He exhibits normal range of motion, no bony tenderness, normal two-point discrimination, normal capillary refill and no deformity. Normal sensation noted.       Hands: Evidence of FB, swelling, and localized erythema along ulnar aspect of right palm; no evidence of cellulitis or lymphangitis; full flexion/extension of all fingers maintained without difficulty; strong radial pulse  and cap refill; normal sensation  Neurological: He is alert and oriented to person, place, and time.  Skin: Skin is warm and dry.  Psychiatric: He has a normal mood and affect.  Nursing note and vitals reviewed.   ED Course  FOREIGN BODY REMOVAL Date/Time: 10/05/2014 5:59 PM Performed by: Garlon HatchetSANDERS, LISA M Authorized by: Garlon HatchetSANDERS, LISA M Consent: Verbal consent obtained. Risks and benefits: risks, benefits and alternatives were discussed Consent given by: patient Patient  understanding: patient states understanding of the procedure being performed Required items: required blood products, implants, devices, and special equipment available Patient identity confirmed: verbally with patient Body area: skin General location: upper extremity Location details: right hand Anesthesia: local infiltration Local anesthetic: lidocaine 1% without epinephrine Anesthetic total: 3 ml Patient sedated: no Patient restrained: no Patient cooperative: yes Localization method: visualized Removal mechanism: forceps and scalpel Dressing: antibiotic ointment and dressing applied Tendon involvement: none Depth: subcutaneous Complexity: simple 1 objects recovered. Objects recovered: splinter Post-procedure assessment: foreign body removed Patient tolerance: Patient tolerated the procedure well with no immediate complications   (including critical care time) DIAGNOSTIC STUDIES: Oxygen Saturation is 98% on room air, normal by my interpretation.    COORDINATION OF CARE: 4:13 PM-Discussed treatment plan which includes removal of splinter with pt at bedside and pt agreed to plan.   Labs Review Labs Reviewed - No data to display  Imaging Review No results found.   EKG Interpretation None      MDM   Final diagnoses:  Foreign body of hand with infection, right, initial encounter   54 y.o. M with right hand pain.  Triage notes states left but is actually right hand.  Thinks he got a splinter in it 2-3 days ago while landscaping.  There is evidence of FB and swelling noted to ulnar aspect of right palm.  Localized erythema without cellulitis, lymphangitis, or fluctuance.  Attending physician, Dr. Effie ShyWentz, also evaluated-- recommends to proceed with I&D and foreign body removal.  Procedure as above, patient tolerated well.  Some purulent drainage expelled, will start on abx.  His tetanus is UTD.  Patient instructed on home wound care.  He was given hand surgery follow-up.   Discussed plan with patient, he/she acknowledged understanding and agreed with plan of care.  Return precautions given for new or worsening symptoms.  I personally performed the services described in this documentation, which was scribed in my presence. The recorded information has been reviewed and is accurate.  Garlon HatchetLisa M Sanders, PA-C 10/05/14 1801  Mancel BaleElliott Wentz, MD 10/07/14 [phone]

## 2014-10-05 NOTE — ED Notes (Signed)
He c/o splinter in palm of L hand that he can not remove x 2 days.

## 2014-10-09 ENCOUNTER — Emergency Department (HOSPITAL_COMMUNITY): Payer: Self-pay

## 2014-10-09 ENCOUNTER — Emergency Department (HOSPITAL_COMMUNITY)
Admission: EM | Admit: 2014-10-09 | Discharge: 2014-10-09 | Disposition: A | Discharge status: Home / Self Care | Payer: Self-pay | Attending: Emergency Medicine | Admitting: Emergency Medicine

## 2014-10-09 ENCOUNTER — Encounter (HOSPITAL_COMMUNITY): Payer: Self-pay | Admitting: *Deleted

## 2014-10-09 DIAGNOSIS — I10 Essential (primary) hypertension: Secondary | ICD-10-CM | POA: Insufficient documentation

## 2014-10-09 DIAGNOSIS — W458XXD Other foreign body or object entering through skin, subsequent encounter: Secondary | ICD-10-CM | POA: Insufficient documentation

## 2014-10-09 DIAGNOSIS — Z79899 Other long term (current) drug therapy: Secondary | ICD-10-CM | POA: Insufficient documentation

## 2014-10-09 DIAGNOSIS — E119 Type 2 diabetes mellitus without complications: Secondary | ICD-10-CM | POA: Insufficient documentation

## 2014-10-09 DIAGNOSIS — M795 Residual foreign body in soft tissue: Secondary | ICD-10-CM

## 2014-10-09 DIAGNOSIS — R112 Nausea with vomiting, unspecified: Secondary | ICD-10-CM | POA: Insufficient documentation

## 2014-10-09 DIAGNOSIS — L02511 Cutaneous abscess of right hand: Secondary | ICD-10-CM | POA: Insufficient documentation

## 2014-10-09 DIAGNOSIS — Z7982 Long term (current) use of aspirin: Secondary | ICD-10-CM | POA: Insufficient documentation

## 2014-10-09 DIAGNOSIS — Z87891 Personal history of nicotine dependence: Secondary | ICD-10-CM | POA: Insufficient documentation

## 2014-10-09 DIAGNOSIS — S60551D Superficial foreign body of right hand, subsequent encounter: Secondary | ICD-10-CM | POA: Insufficient documentation

## 2014-10-09 MED ORDER — HYDROCODONE-ACETAMINOPHEN 5-325 MG PO TABS
2.0000 | ORAL_TABLET | Freq: Once | ORAL | Status: AC
  Administered 2014-10-09: 2 via ORAL
  Filled 2014-10-09: qty 2

## 2014-10-09 MED ORDER — SULFAMETHOXAZOLE-TRIMETHOPRIM 800-160 MG PO TABS
1.0000 | ORAL_TABLET | Freq: Once | ORAL | Status: AC
  Administered 2014-10-09: 1 via ORAL
  Filled 2014-10-09: qty 1

## 2014-10-09 MED ORDER — SULFAMETHOXAZOLE-TRIMETHOPRIM 800-160 MG PO TABS: 1.0000 | ORAL_TABLET | Freq: Two times a day (BID) | ORAL | Status: DC

## 2014-10-09 MED ORDER — CEPHALEXIN 500 MG PO CAPS: 500.0000 mg | ORAL_CAPSULE | Freq: Four times a day (QID) | ORAL | Status: DC

## 2014-10-09 MED ORDER — LIDOCAINE HCL (PF) 1 % IJ SOLN
30.0000 mL | Freq: Once | INTRAMUSCULAR | Status: AC
  Administered 2014-10-09: 30 mL
  Filled 2014-10-09: qty 30

## 2014-10-09 MED ORDER — CEPHALEXIN 250 MG PO CAPS
500.0000 mg | ORAL_CAPSULE | Freq: Once | ORAL | Status: AC
  Administered 2014-10-09: 500 mg via ORAL
  Filled 2014-10-09: qty 2

## 2014-10-09 NOTE — ED Provider Notes (Signed)
CSN: [id number]     Arrival date & time 10/09/14  1338 History   First MD Initiated Contact with Patient 10/09/14 1522     Chief Complaint  Patient presents with  . Hand Problem  . Wound Check     (Consider location/radiation/quality/duration/timing/severity/associated sxs/prior Treatment) HPI Marc [name] is a 54 year old male with past medical history of hypertension, diabetes who presents to the ER plenty of right hand swelling and purulent discharge. Patient reports approximately one week ago he retained splinter in his hand. He states this past Monday, 5 days ago he presented to the emergency room for same. Patient removed splinters removed from hand, patient states taking antibiotics for approximate 48 hours, then losing his antibiotics. He states he has not been on them for approximately 24 hours due to losing them. Since Monday. Patient reports increase in erythema, swelling, the past 24 hours his nose purulent discharge. Patient denies nausea, vomiting, fever, numbness, weakness, loss of sensation or function.  Past Medical History  Diagnosis Date  . Diabetes mellitus without complication   . Hypertension    History reviewed. No pertinent past surgical history. History reviewed. No pertinent family history. History  Substance Use Topics  . Smoking status: Former Games developermoker  . Smokeless tobacco: Not on file  . Alcohol Use: Yes    Review of Systems  Constitutional: Negative for fever.  HENT: Negative for trouble swallowing.   Eyes: Negative for visual disturbance.  Respiratory: Negative for shortness of breath.   Cardiovascular: Negative for chest pain.  Gastrointestinal: Positive for nausea and vomiting.  Genitourinary: Negative for dysuria.  Musculoskeletal:       Swelling in hand   Skin: Negative for rash.  Neurological: Negative for dizziness, weakness and numbness.  Psychiatric/Behavioral: Negative.       Allergies  Review of patient's allergies indicates no  known allergies.  Home Medications   Prior to Admission medications   Medication Sig Start Date End Date Taking? Authorizing Provider  acetaminophen (TYLENOL) 500 MG tablet Take 500 mg by mouth every 6 (six) hours as needed for mild pain.   Yes Historical Provider, MD  hydrochlorothiazide (HYDRODIURIL) 25 MG tablet Take 1 tablet (25 mg total) by mouth daily. 02/23/14  Yes Raeford RazorStephen Kohut, MD  hydrochlorothiazide (HYDRODIURIL) 25 MG tablet Take 25 mg by mouth daily.   Yes Historical Provider, MD  metFORMIN (GLUCOPHAGE) 500 MG tablet Take 1 tablet (500 mg total) by mouth 2 (two) times daily with a meal. 02/23/14  Yes Raeford RazorStephen Kohut, MD  traMADol (ULTRAM) 50 MG tablet Take 1 tablet (50 mg total) by mouth every 6 (six) hours as needed. Patient taking differently: Take 50 mg by mouth every 6 (six) hours as needed for moderate pain.  10/05/14  Yes Garlon HatchetLisa M Sanders, PA-C  aspirin 81 MG chewable tablet Chew 4 tablets (324 mg total) by mouth daily. Patient not taking: Reported on 10/09/2014 03/06/14   Richardean Canalavid H Yao, MD  cephALEXin (KEFLEX) 500 MG capsule Take 1 capsule (500 mg total) by mouth 4 (four) times daily. 10/09/14   Ladona MowJoe Olivette Beckmann, PA-C  levETIRAcetam (KEPPRA) 500 MG tablet Take 1 tablet (500 mg total) by mouth 2 (two) times daily. Patient not taking: Reported on 10/09/2014 03/06/14   Richardean Canalavid H Yao, MD  lisinopril (PRINIVIL,ZESTRIL) 5 MG tablet Take 4 tablets (20 mg total) by mouth daily. Patient not taking: Reported on 10/09/2014 02/23/14   Raeford RazorStephen Kohut, MD  sulfamethoxazole-trimethoprim (BACTRIM DS,SEPTRA DS) 800-160 MG per tablet Take 1 tablet by mouth 2 (  two) times daily. 10/09/14   Ladona MowJoe Namrata Dangler, PA-C   BP 153/70 mmHg  Pulse 89  Temp(Src) 98.9 F (37.2 C) (Oral)  Resp 18  Ht 5\' 9"  (1.753 m)  SpO2 99% Physical Exam  Constitutional: He is oriented to person, place, and time. He appears well-developed and well-nourished. No distress.  HENT:  Head: Normocephalic and atraumatic.  Eyes: Right eye exhibits no  discharge. Left eye exhibits no discharge. No scleral icterus.  Neck: Normal range of motion.  Pulmonary/Chest: Effort normal. No respiratory distress.  Musculoskeletal: Normal range of motion.  Moderate amount of swelling, erythema with small opening purulent discharge noted to ulnar aspect of the palm. Associated surrounding erythema and mild induration. Patient is full range of motion of fingers, hand, wrist. Radial pulse 2+. Distal sensation intact. Capillary refill less than 2 seconds distally.  Neurological: He is alert and oriented to person, place, and time.  Skin: Skin is warm and dry. He is not diaphoretic.  Psychiatric: He has a normal mood and affect.  Nursing note and vitals reviewed.   ED Course  FOREIGN BODY REMOVAL Date/Time: 10/10/2014 7:09 AM Performed by: Ladona MowMINTZ, Lakie Mclouth Authorized by: Ladona MowMINTZ, Yexalen Deike Consent: Verbal consent obtained. Risks and benefits: risks, benefits and alternatives were discussed Consent given by: patient Patient identity confirmed: verbally with patient Body area: skin General location: upper extremity Location details: right hand Anesthesia: local infiltration Local anesthetic: lidocaine 1% without epinephrine Anesthetic total: 5 ml Patient sedated: no Patient cooperative: yes Removal mechanism: irrigation Dressing: dressing applied Tendon involvement: none Complexity: simple 1 objects recovered. Objects recovered: 1 Post-procedure assessment: foreign body removed Patient tolerance: Patient tolerated the procedure well with no immediate complications   (including critical care time) Labs Review Labs Reviewed - No data to display  Imaging Review Dg Hand 2 View Right  10/09/2014   CLINICAL DATA:  54 year old male with left hand pain, swelling and abscess. History of splint her anterior to the fourth and fifth digits  EXAM: RIGHT HAND - 2 VIEW  COMPARISON:  None.  FINDINGS: Extensive soft tissue swelling about the hand, particularly at the  hypothenar eminence. There is a 3 mm linear radiopaque foreign body imbedded in the soft tissues lateral to the mid fifth metacarpal. On the frontal view, the foreign body is approximately 4- 5 mm deep to the skin surface. On the lateral view, the foreign body is approximately 8 mm palmar to the metacarpals.  Otherwise, the visualized osseous structures are intact and unremarkable.  IMPRESSION: Small 3 mm linear foreign body in the soft tissues palmar and lateral to the fifth metacarpal.  There is extensive associated soft tissue swelling.   Electronically Signed   By: Malachy MoanHeath  McCullough M.D.   On: 10/09/2014 16:56     EKG Interpretation None            MDM   Final diagnoses:  Abscess of right hand  Foreign body (FB) in soft tissue    Patient with skin abscess amenable to incision and drainage.  Abscess included 3mm foreign body noted on radiographs of hand. 3 mm foreign body removed.  Strongly encouraged wound recheck in 2 days here along with follow-up with hand surgery. Encouraged home warm soaks and flushing.  Mild signs of cellulitis is surrounding skin.  Will d/c to home.  Patient placed on Bactrim and Keflex. Strict return precautions discussed, pt verbalized understanding and agreement of this plan.    BP 153/70 mmHg  Pulse 89  Temp(Src) 98.9 F (37.2 C) (  Oral)  Resp 18  Ht  (1.753 m)  SpO2 99%  Signed,  Ladona Mow, PA-C 7:16 AM  Patient seen and discussed with Dr. Gray Bernhardt, MD     Ladona Mow, PA-C 10/10/14 1610  Mancel Bale, MD 10/10/14 [phone]

## 2014-10-09 NOTE — ED Notes (Signed)
Pt reports being seen here on 5/16 for splinter to right hand, had it removed and taking antibiotics as prescribed. Now having swelling and drainage from wound.

## 2014-10-09 NOTE — ED Notes (Signed)
Pt is going to xray.

## 2014-10-09 NOTE — ED Notes (Signed)
MD at bedside. 

## 2014-10-09 NOTE — ED Notes (Signed)
Suture Cart at the bedside.  ?

## 2014-10-09 NOTE — Discharge Instructions (Signed)
Abscess An abscess is an infected area that contains a collection of pus and debris.It can occur in almost any part of the body. An abscess is also known as a furuncle or boil. CAUSES  An abscess occurs when tissue gets infected. This can occur from blockage of oil or sweat glands, infection of hair follicles, or a minor injury to the skin. As the body tries to fight the infection, pus collects in the area and creates pressure under the skin. This pressure causes pain. People with weakened immune systems have difficulty fighting infections and get certain abscesses more often.  SYMPTOMS Usually an abscess develops on the skin and becomes a painful mass that is red, warm, and tender. If the abscess forms under the skin, you may feel a moveable soft area under the skin. Some abscesses break open (rupture) on their own, but most will continue to get worse without care. The infection can spread deeper into the body and eventually into the bloodstream, causing you to feel ill.  DIAGNOSIS  Your caregiver will take your medical history and perform a physical exam. A sample of fluid may also be taken from the abscess to determine what is causing your infection. TREATMENT  Your caregiver may prescribe antibiotic medicines to fight the infection. However, taking antibiotics alone usually does not cure an abscess. Your caregiver may need to make a small cut (incision) in the abscess to drain the pus. In some cases, gauze is packed into the abscess to reduce pain and to continue draining the area. HOME CARE INSTRUCTIONS   Only take over-the-counter or prescription medicines for pain, discomfort, or fever as directed by your caregiver.  If you were prescribed antibiotics, take them as directed. Finish them even if you start to feel better.  If gauze is used, follow your caregiver's directions for changing the gauze.  To avoid spreading the infection:  Keep your draining abscess covered with a  bandage.  Wash your hands well.  Do not share personal care items, towels, or whirlpools with others.  Avoid skin contact with others.  Keep your skin and clothes clean around the abscess.  Keep all follow-up appointments as directed by your caregiver. SEEK MEDICAL CARE IF:   You have increased pain, swelling, redness, fluid drainage, or bleeding.  You have muscle aches, chills, or a general ill feeling.  You have a fever. MAKE SURE YOU:   Understand these instructions.  Will watch your condition.  Will get help right away if you are not doing well or get worse. Document Released: 02/15/2005 Document Revised: 11/07/2011 Document Reviewed: 07/21/2011 [facility] Patient Information 2015 [place], [place]. This information is not intended to replace advice given to you by your health care provider. Make sure you discuss any questions you have with your health care provider.  Wood Splinters Wood splinters need to be removed because they can cause skin irritation and infection. If they are close to the surface, splinters can usually be removed easily. Deep splinters may be hard to locate and need treatment by a surgeon. SPLINTER REMOVAL Removal of splinters by your caregiver is considered a surgical procedure.   The area is carefully cleaned. You may require a small amount of anesthesia (medicine injected near the splinter to numb the tissue and lessen pain). After the splinter is removed, the area will be cleaned again. A bandage is applied.  If your splinter is under a fingernail or toenail, then a small section of the nail may need to be removed.  As long as the splinter did not extend to the base of the nail, the nail usually grows back normally.  A splinter that is deeper, more contaminated, or that gets near a structure such as a bone, nerve or blood vessel may need to be removed by a Careers adviser.  You may need special X-rays or scans if the splinter is hard to locate.  Every attempt  is made to remove the entire splinter. However, small particles may remain. Tell your caregiver if you feel that a part of the splinter was left behind. HOME CARE INSTRUCTIONS   Keep the injured area high up (elevated).  Use the injured area as little as possible.  Keep the injured area clean and dry. Follow any directions from your caregiver.  Keep any follow-up or wound check appointments. You might need a tetanus shot now if:  You have no idea when you had the last one.  You have never had a tetanus shot before.  The injured area had dirt in it. Even if you have already removed the splinter, call your caregiver to get a tetanus shot if you need one.  If you need a tetanus shot, and you decide not to get one, there is a rare chance of getting tetanus. Sickness from tetanus can be serious. If you did get a tetanus shot, your arm may swell, get red and warm to the touch at the shot site. This is common and not a problem. SEEK MEDICAL CARE IF:   A splinter has been removed, but you are not better in a day or two.  You develop a temperature.  Signs of infection develop such as:  Redness, swelling or pus around the wound.  Red streaks spreading back from your wound towards your body. Document Released: 06/15/2004 Document Revised: 09/22/2013 Document Reviewed: 05/18/2008 [facility] Patient Information 2015 [place], [place]. This information is not intended to replace advice given to you by your health care provider. Make sure you discuss any questions you have with your health care provider.   Emergency Department Resource Guide 1) Find a Doctor and Pay Out of Pocket Although you won't have to find out who is covered by your insurance plan, it is a good idea to ask around and get recommendations. You will then need to call the office and see if the doctor you have chosen will accept you as a new patient and what types of options they offer for patients who are self-pay. Some doctors  offer discounts or will set up payment plans for their patients who do not have insurance, but you will need to ask so you aren't surprised when you get to your appointment.  2) Contact Your Local Health Department Not all health departments have doctors that can see patients for sick visits, but many do, so it is worth a call to see if yours does. If you don't know where your local health department is, you can check in your phone book. The CDC also has a tool to help you locate your state's health department, and many state websites also have listings of all of their local health departments.  3) Find a Walk-in Clinic If your illness is not likely to be very severe or complicated, you may want to try a walk in clinic. These are popping up all over the country in pharmacies, drugstores, and shopping centers. They're usually staffed by nurse practitioners or physician assistants that have been trained to treat common illnesses and complaints. They're usually fairly  quick and inexpensive. However, if you have serious medical issues or chronic medical problems, these are probably not your best option.  No Primary Care Doctor: - Call Health Connect at  [phone] - they can help you locate a primary care doctor that  accepts your insurance, provides certain services, etc. - Physician Referral Service- [phone]  Chronic Pain Problems: Organization         Address  Phone   Notes  [facility]  [phone] Patients need to be referred by their primary care doctor.   Medication Assistance: Organization         Address  Phone   Notes  [facility] Medication [facility] [address] [place], [place] [address] [phone] --Must be a resident of [facility] -- Must have NO insurance coverage whatsoever (no Medicaid/ Medicare, etc.) -- The pt. MUST have a primary care doctor that directs their care regularly and follows them in the community    MedAssist  [phone]   [employer]  [phone]    Agencies that provide inexpensive medical care: Organization         Address  Phone   Notes  [facility]  [phone]   [facility]    [phone]   [facility] [address] [place], [place] [address] [phone]   [facility] [address] [place] [place] [phone]   Planned Parenthood    [phone]   [facility]    [phone]   Community Health and [facility]  [address], [place] Phone:  [phone], Fax:  [phone] Hours of Operation:  9 am - 6 pm, M-F.  Also accepts Medicaid/Medicare and self-pay.  [facility]  [address], [place] Phone: [phone], Fax: [phone]. Hours of Operation:  8:30 am - 5:30 pm, M-F.  Also accepts Medicaid and self-pay.  [facility] [place] [address], [place] Phone: [phone]   [facility] [address] Natasha Bence [place], [place] [phone], Ext. [phone] Mondays & Thursdays: 7-9 AM.  First 15 patients are seen on a first come, first serve basis.    Medicaid-accepting [facility] Providers:  Organization         Address  Phone   Notes  [facility] [address], [place] [phone] Also accepts self-pay patients.  [facility] [address] Laurell Josephs [place], [place]  [phone]   [facility] [address], [place] [phone]   [facility] [address], [place] [phone]   Renaye Rakers [address], [place]   [phone] Only accepts [place] patients after they have their name applied to their card.   Self-Pay (no insurance) in [facility]:  Organization         Address  Phone   Notes  Sickle Cell Patients, [facility] Internal  Medicine [address] [place], [place] [phone]   [facility] [address] [place], [place] [phone]   [facility]  [address], [place] [phone]   [facility]/Dr. Osei-Bonsu  [address], [place] or [address], [place] [phone] Phone number  for both [place] and [place] locations is the same.  [facility]gent Medical and [facility] [address], [place] [phone]   [facility] [place] [address] [place] [address], [place] or [address] [phone]   [facility] [address], [place] [phone], phone; [phone], fax Sees patients 1st and 3rd Saturday of every month.  Must not qualify for public or private insurance (i.e. Medicaid, Medicare, [place] Health Choice, Veterans' Benefits)  Household income should be no more than 200% of the poverty level The clinic cannot treat you if you are pregnant or think you are pregnant  Sexually transmitted diseases are not treated at the clinic.    Dental Care: Organization         Address  Phone  Notes  [facility] [address], [place] [phone] Accepts children up to age 52 who are enrolled in [place] or [place] Health Choice; pregnant women with a Medicaid card; and children who have applied for Medicaid or [place] Health Choice, but were declined, whose parents can pay a reduced fee at time of service.  [facility] [place]  [address], [place] [phone] Accepts children up to age 52 who are enrolled in [place] or [place] Health Choice; pregnant women with a Medicaid card; and children who have applied for Medicaid or [place] Health Choice, but were declined, whose parents can pay a reduced fee at time of service.  [facility]  [address], [place] [phone] Patients are seen by appointment only. Walk-ins are not accepted. [facility] will see patients 52 years of age and older. Monday - Tuesday (8am-5pm) Most Wednesdays (8:30-5pm) $30 per visit, cash only  [facility]  [address], [place] [facility] [phone] Patients are seen by appointment only. Walk-ins are not accepted. [facility] will see patients 52 years of age and older. One Wednesday Evening (Monthly: Volunteer Based).  $30 per visit, cash only  [employer] [facility]  [phone] for adults; Children under age 244, call Graduate Pediatric Dentistry at [phone]. Children aged 184-14, please call [phone] to request a pediatric application.  Dental services are provided in all areas of dental care including fillings, crowns and bridges, complete and partial dentures, implants, gum treatment, root canals, and extractions. Preventive care is also provided. Treatment is provided to both adults and children. Patients are selected via a lottery and there is often a waiting list.   [facility] [address], [place]  [phone] [email or web address]   [facility] [address], [place], [place] [phone], Ext. [phone] Second and Fourth Thursday of each month, opens at 6:30 AM; Clinic ends at 9 AM.  Patients are seen on a first-come first-served basis, and a limited number are seen during each clinic.   [facility]  [address], [place], [place] [phone]   Eligibility Requirements You must have lived in [place], [place], or [place] counties for at least the last three months.   You cannot be eligible for state or federal sponsored National Cityhealthcare insurance, including CIGNAVeterans Administration, [place]icaid, or [employer]icare.   You generally cannot be eligible for healthcare insurance through your employer.    How to apply: Eligibility screenings are held every Tuesday and  Wednesday afternoon from 1:00 pm until  4:00 pm. You do not need an appointment for the interview!  [facility] [address], [place], [place] [phone]   [facility]  [phone]   [facility]  [phone]   [facility]  [phone]    Behavioral Health Resources in the Community: Intensive Outpatient Programs Organization         Address  Phone  Notes  [facility] [address], [place], [place] [phone]   [facility] [address], [place], [place] [phone]   [facility] [address], [place], [place]  [phone]   [facility] [address],  [place], [place] [phone] or [phone]   Substance Abuse Resources Organization         Address  Phone  Notes  [facility]  [phone]   [facility]  [phone]   [facility]  [phone]   [facility]  [phone]   Residential & Outpatient Substance Abuse Program  [phone]   Psychological Services Organization         Address  Phone  Notes  [facility]  [phone]- [phone]   [facility]  [phone]- [phone]   [facility] [address], [place] [phone] or [phone]    Mobile Crisis Teams Organization         Address  Phone  Notes  [facility], Mobile Crisis Care Unit  [phone]   [facility]  [address] [place], [place] [phone]   Doristine LocksSharon DeEsch [address] [place] [phone]    Self-Help/Support Groups Organization         Address  Phone             Notes  [facility] [place] - variety of support groups  [phone] Call for more information  Narcotics Anonymous (NA), [facility] [address], [place]  2 meetings at this location   Risk manageresidential  Treatment Programs Organization         Address  Phone  Notes  [facility] [address],    [place]  [phone]   [facility]  [address], [place] [phone], [place], [place] [phone]   [facility] [address], [place] [phone] Admissions: 8am-3pm M-F  [facility] [address]    [place], [place] [phone]   [facility] [address], [place], [place] [phone]   [facility] [address]  [place], [place] [phone]   [facility] - Intensive Outpatient [address], [place], [place] [phone]   [facility] ([facility] [address]  [place], [place] [phone] or [phone]   [facility] ([facility]) [address] [place], [place] [phone] Accepts Medicaid  [facility] [address]  [place] [phone] Substance Abuse/Addiction Treatment   [facility] Resources Organization         Address  Phone  Notes  [facility]  [phone]   Angie FavaJulie Brannon, PhD [address], [address] [place], [place]   [phone] or [phone](336) [phone]   [facility]   [address] [place], [place] [phone]   [facility] [address], [place], [place] [phone] Insurance/Medicaid/sponsorship through [facility]  [facility] Families [address]  [place], [place] [phone] Therapy/tele-psych/case  [facility] [address]   [place], [place] [phone]    Dr. Lolly Mustache  [phone]   [facility]  [employer] [facility] 1) [address] [place] 2) [address] [place] 3)  [address], [place] [phone]  [phone]   [facility] Child Abuse Hotline 5716688696 or [phone] (After Hours)

## 2014-10-09 NOTE — ED Notes (Signed)
PA finished procedure.

## 2014-10-09 NOTE — ED Provider Notes (Signed)
  Face-to-face evaluation   History: Patient here for follow-up of hand injury, by splinter. Here in the ED, 10/05/2014 with I&D for removal of wood splinter. Wound was left opened. He was treated with antibiotics but has lost his prescription. He has not been soaking the wound.   Physical exam: Alert, calm, cooperative, in mild pain. Right hyperthenar eminence, wound, with swelling, wound is closed and nondraining.  Medical screening examination/treatment/procedure(s) were conducted as a shared visit with non-physician practitioner(s) and myself.  I personally evaluated the patient during the encounter  Mancel BaleElliott Nikitas Davtyan, MD 10/10/14 [id number]

## 2014-11-16 ENCOUNTER — Encounter (HOSPITAL_COMMUNITY): Payer: Self-pay | Admitting: Emergency Medicine

## 2014-11-16 ENCOUNTER — Inpatient Hospital Stay (HOSPITAL_COMMUNITY)
Admission: EM | Admit: 2014-11-16 | Discharge: 2014-11-22 | DRG: 871 | Disposition: A | Discharge status: Home / Self Care | Payer: Medicaid Other | Attending: Internal Medicine | Admitting: Internal Medicine

## 2014-11-16 DIAGNOSIS — E860 Dehydration: Secondary | ICD-10-CM | POA: Diagnosis present

## 2014-11-16 DIAGNOSIS — I129 Hypertensive chronic kidney disease with stage 1 through stage 4 chronic kidney disease, or unspecified chronic kidney disease: Secondary | ICD-10-CM | POA: Diagnosis present

## 2014-11-16 DIAGNOSIS — E1310 Other specified diabetes mellitus with ketoacidosis without coma: Secondary | ICD-10-CM | POA: Diagnosis present

## 2014-11-16 DIAGNOSIS — N136 Pyonephrosis: Secondary | ICD-10-CM | POA: Diagnosis present

## 2014-11-16 DIAGNOSIS — E1100 Type 2 diabetes mellitus with hyperosmolarity without nonketotic hyperglycemic-hyperosmolar coma (NKHHC): Secondary | ICD-10-CM

## 2014-11-16 DIAGNOSIS — K219 Gastro-esophageal reflux disease without esophagitis: Secondary | ICD-10-CM | POA: Diagnosis present

## 2014-11-16 DIAGNOSIS — E1110 Type 2 diabetes mellitus with ketoacidosis without coma: Secondary | ICD-10-CM | POA: Diagnosis present

## 2014-11-16 DIAGNOSIS — Z833 Family history of diabetes mellitus: Secondary | ICD-10-CM

## 2014-11-16 DIAGNOSIS — Z8249 Family history of ischemic heart disease and other diseases of the circulatory system: Secondary | ICD-10-CM

## 2014-11-16 DIAGNOSIS — E1165 Type 2 diabetes mellitus with hyperglycemia: Secondary | ICD-10-CM | POA: Diagnosis present

## 2014-11-16 DIAGNOSIS — D638 Anemia in other chronic diseases classified elsewhere: Secondary | ICD-10-CM | POA: Diagnosis present

## 2014-11-16 DIAGNOSIS — Z7982 Long term (current) use of aspirin: Secondary | ICD-10-CM

## 2014-11-16 DIAGNOSIS — E871 Hypo-osmolality and hyponatremia: Secondary | ICD-10-CM | POA: Diagnosis present

## 2014-11-16 DIAGNOSIS — A419 Sepsis, unspecified organism: Secondary | ICD-10-CM | POA: Diagnosis present

## 2014-11-16 DIAGNOSIS — N189 Chronic kidney disease, unspecified: Secondary | ICD-10-CM | POA: Diagnosis present

## 2014-11-16 DIAGNOSIS — J189 Pneumonia, unspecified organism: Secondary | ICD-10-CM | POA: Diagnosis present

## 2014-11-16 DIAGNOSIS — Z87891 Personal history of nicotine dependence: Secondary | ICD-10-CM | POA: Diagnosis not present

## 2014-11-16 DIAGNOSIS — Z79899 Other long term (current) drug therapy: Secondary | ICD-10-CM | POA: Diagnosis not present

## 2014-11-16 DIAGNOSIS — I10 Essential (primary) hypertension: Secondary | ICD-10-CM | POA: Diagnosis present

## 2014-11-16 DIAGNOSIS — E1121 Type 2 diabetes mellitus with diabetic nephropathy: Secondary | ICD-10-CM | POA: Diagnosis present

## 2014-11-16 DIAGNOSIS — N170 Acute kidney failure with tubular necrosis: Secondary | ICD-10-CM | POA: Diagnosis present

## 2014-11-16 DIAGNOSIS — N320 Bladder-neck obstruction: Secondary | ICD-10-CM | POA: Diagnosis present

## 2014-11-16 DIAGNOSIS — R739 Hyperglycemia, unspecified: Secondary | ICD-10-CM | POA: Diagnosis present

## 2014-11-16 DIAGNOSIS — F1010 Alcohol abuse, uncomplicated: Secondary | ICD-10-CM | POA: Diagnosis present

## 2014-11-16 DIAGNOSIS — E876 Hypokalemia: Secondary | ICD-10-CM | POA: Diagnosis present

## 2014-11-16 DIAGNOSIS — N139 Obstructive and reflux uropathy, unspecified: Secondary | ICD-10-CM | POA: Diagnosis present

## 2014-11-16 DIAGNOSIS — D72829 Elevated white blood cell count, unspecified: Secondary | ICD-10-CM | POA: Insufficient documentation

## 2014-11-16 DIAGNOSIS — N3941 Urge incontinence: Secondary | ICD-10-CM | POA: Diagnosis present

## 2014-11-16 DIAGNOSIS — N400 Enlarged prostate without lower urinary tract symptoms: Secondary | ICD-10-CM | POA: Diagnosis present

## 2014-11-16 DIAGNOSIS — Z9114 Patient's other noncompliance with medication regimen: Secondary | ICD-10-CM | POA: Diagnosis present

## 2014-11-16 DIAGNOSIS — Z87440 Personal history of urinary (tract) infections: Secondary | ICD-10-CM

## 2014-11-16 DIAGNOSIS — N390 Urinary tract infection, site not specified: Secondary | ICD-10-CM

## 2014-11-16 DIAGNOSIS — N179 Acute kidney failure, unspecified: Secondary | ICD-10-CM

## 2014-11-16 LAB — BASIC METABOLIC PANEL
ANION GAP: 15 (ref 5–15)
Anion gap: 16 — ABNORMAL HIGH (ref 5–15)
BUN: 56 mg/dL — ABNORMAL HIGH (ref 6–20)
BUN: 56 mg/dL — ABNORMAL HIGH (ref 6–20)
CALCIUM: 8.1 mg/dL — AB (ref 8.9–10.3)
CO2: 23 mmol/L (ref 22–32)
CO2: 23 mmol/L (ref 22–32)
Calcium: 7.9 mg/dL — ABNORMAL LOW (ref 8.9–10.3)
Chloride: 87 mmol/L — ABNORMAL LOW (ref 101–111)
Chloride: 92 mmol/L — ABNORMAL LOW (ref 101–111)
Creatinine, Ser: 1.56 mg/dL — ABNORMAL HIGH (ref 0.61–1.24)
Creatinine, Ser: 1.74 mg/dL — ABNORMAL HIGH (ref 0.61–1.24)
GFR calc Af Amer: 57 mL/min — ABNORMAL LOW (ref >60)
GFR calc Af Amer: 50 mL/min — ABNORMAL LOW (ref >60)
GFR calc non Af Amer: 49 mL/min — ABNORMAL LOW (ref >60)
GFR calc non Af Amer: 43 mL/min — ABNORMAL LOW (ref >60)
Glucose, Bld: 523 mg/dL — ABNORMAL HIGH (ref 65–99)
Glucose, Bld: 250 mg/dL — ABNORMAL HIGH (ref 65–99)
POTASSIUM: 3.5 mmol/L (ref 3.5–5.1)
Potassium: 4.1 mmol/L (ref 3.5–5.1)
SODIUM: 130 mmol/L — AB (ref 135–145)
Sodium: 126 mmol/L — ABNORMAL LOW (ref 135–145)

## 2014-11-16 LAB — URINE MICROSCOPIC-ADD ON

## 2014-11-16 LAB — URINALYSIS, ROUTINE W REFLEX MICROSCOPIC
Bilirubin Urine: NEGATIVE
Glucose, UA: >1000 mg/dL — AB
Ketones, ur: 15 mg/dL — AB
Nitrite: NEGATIVE
Protein, ur: 30 mg/dL — AB
Specific Gravity, Urine: 1.019 (ref 1.005–1.030)
Urobilinogen, UA: 1 mg/dL (ref 0.0–1.0)
pH: 5.5 (ref 5.0–8.0)

## 2014-11-16 LAB — CBG MONITORING, ED
Glucose-Capillary: 530 mg/dL — ABNORMAL HIGH (ref 65–99)
Glucose-Capillary: 437 mg/dL — ABNORMAL HIGH (ref 65–99)

## 2014-11-16 LAB — BLOOD GAS, VENOUS
Acid-base deficit: 1.8 mmol/L (ref 0.0–2.0)
Bicarbonate: 22.6 mEq/L (ref 20.0–24.0)
FIO2: 0.21 %
O2 Saturation: 81.9 %
Patient temperature: 98.6
TCO2: 20.7 mmol/L (ref 0–100)
pCO2, Ven: 39.3 mmHg — ABNORMAL LOW (ref 45.0–50.0)
pH, Ven: 7.378 — ABNORMAL HIGH (ref 7.250–7.300)
pO2, Ven: 51.6 mmHg — ABNORMAL HIGH (ref 30.0–45.0)

## 2014-11-16 LAB — CBC WITH DIFFERENTIAL/PLATELET
Band Neutrophils: 4 % (ref 0–10)
Basophils Absolute: 0 10*3/uL (ref 0.0–0.1)
Basophils Relative: 0 % (ref 0–1)
Blasts: 0 %
Eosinophils Absolute: 0 10*3/uL (ref 0.0–0.7)
Eosinophils Relative: 0 % (ref 0–5)
HCT: 34.3 % — ABNORMAL LOW (ref 39.0–52.0)
Hemoglobin: 11.5 g/dL — ABNORMAL LOW (ref 13.0–17.0)
Lymphocytes Relative: 2 % — ABNORMAL LOW (ref 12–46)
Lymphs Abs: 0.6 10*3/uL — ABNORMAL LOW (ref 0.7–4.0)
MCH: 27.1 pg (ref 26.0–34.0)
MCHC: 33.5 g/dL (ref 30.0–36.0)
MCV: 80.7 fL (ref 78.0–100.0)
Metamyelocytes Relative: 0 %
Monocytes Absolute: 1.9 10*3/uL — ABNORMAL HIGH (ref 0.1–1.0)
Monocytes Relative: 6 % (ref 3–12)
Myelocytes: 4 %
Neutro Abs: 28.8 10*3/uL — ABNORMAL HIGH (ref 1.7–7.7)
Neutrophils Relative %: 84 % — ABNORMAL HIGH (ref 43–77)
Other: 0 %
Platelets: 234 10*3/uL (ref 150–400)
Promyelocytes Absolute: 0 %
RBC: 4.25 MIL/uL (ref 4.22–5.81)
RDW: 13.9 % (ref 11.5–15.5)
WBC: 31.3 10*3/uL — ABNORMAL HIGH (ref 4.0–10.5)
nRBC: 0 /100 WBC

## 2014-11-16 LAB — RAPID URINE DRUG SCREEN, HOSP PERFORMED
AMPHETAMINES: NOT DETECTED
BARBITURATES: NOT DETECTED
Benzodiazepines: NOT DETECTED
Cocaine: NOT DETECTED
Opiates: POSITIVE — AB
Tetrahydrocannabinol: NOT DETECTED

## 2014-11-16 LAB — HEPATIC FUNCTION PANEL
ALT: 62 U/L (ref 17–63)
AST: 37 U/L (ref 15–41)
Albumin: 2.7 g/dL — ABNORMAL LOW (ref 3.5–5.0)
Alkaline Phosphatase: 161 U/L — ABNORMAL HIGH (ref 38–126)
BILIRUBIN DIRECT: 0.6 mg/dL — AB (ref 0.1–0.5)
BILIRUBIN TOTAL: 0.9 mg/dL (ref 0.3–1.2)
Indirect Bilirubin: 0.3 mg/dL (ref 0.3–0.9)
Total Protein: 7.4 g/dL (ref 6.5–8.1)

## 2014-11-16 LAB — LIPASE, BLOOD: Lipase: 34 U/L (ref 22–51)

## 2014-11-16 LAB — PROCALCITONIN: Procalcitonin: 36.84 ng/mL

## 2014-11-16 LAB — GLUCOSE, CAPILLARY
Glucose-Capillary: 477 mg/dL — ABNORMAL HIGH (ref 65–99)
Glucose-Capillary: 350 mg/dL — ABNORMAL HIGH (ref 65–99)

## 2014-11-16 MED ORDER — SODIUM CHLORIDE 0.9 % IV SOLN: INTRAVENOUS | Status: DC

## 2014-11-16 MED ORDER — FOLIC ACID 1 MG PO TABS
1.0000 mg | ORAL_TABLET | Freq: Every day | ORAL | Status: DC
  Administered 2014-11-16 – 2014-11-22 (×7): 1 mg via ORAL
  Filled 2014-11-16 (×7): qty 1

## 2014-11-16 MED ORDER — DEXTROSE-NACL 5-0.45 % IV SOLN
INTRAVENOUS | Status: DC
  Administered 2014-11-16: 20:00:00 via INTRAVENOUS

## 2014-11-16 MED ORDER — SODIUM CHLORIDE 0.9 % IV BOLUS (SEPSIS)
1000.0000 mL | Freq: Once | INTRAVENOUS | Status: AC
  Administered 2014-11-16: 1000 mL via INTRAVENOUS

## 2014-11-16 MED ORDER — MORPHINE SULFATE 4 MG/ML IJ SOLN
4.0000 mg | Freq: Once | INTRAMUSCULAR | Status: AC
  Administered 2014-11-16: 4 mg via INTRAVENOUS
  Filled 2014-11-16: qty 1

## 2014-11-16 MED ORDER — SODIUM CHLORIDE 0.9 % IV SOLN
INTRAVENOUS | Status: DC
  Administered 2014-11-16: 900 [IU]/h via INTRAVENOUS
  Administered 2014-11-16: 3.8 [IU]/h via INTRAVENOUS
  Administered 2014-11-16: 5.8 [IU]/h via INTRAVENOUS
  Filled 2014-11-16: qty 2.5

## 2014-11-16 MED ORDER — THIAMINE HCL 100 MG/ML IJ SOLN: 100.0000 mg | Freq: Every day | INTRAMUSCULAR | Status: DC

## 2014-11-16 MED ORDER — CEFTRIAXONE SODIUM IN DEXTROSE 20 MG/ML IV SOLN
1.0000 g | INTRAVENOUS | Status: DC
Start: 2014-11-17 — End: 2014-11-21
  Administered 2014-11-17 – 2014-11-21 (×5): 1 g via INTRAVENOUS
  Filled 2014-11-16 (×5): qty 50

## 2014-11-16 MED ORDER — VITAMIN B-1 100 MG PO TABS
100.0000 mg | ORAL_TABLET | Freq: Every day | ORAL | Status: DC
  Administered 2014-11-16 – 2014-11-22 (×7): 100 mg via ORAL
  Filled 2014-11-16 (×8): qty 1

## 2014-11-16 MED ORDER — LORAZEPAM 2 MG/ML IJ SOLN
1.0000 mg | Freq: Four times a day (QID) | INTRAMUSCULAR | Status: AC | PRN
  Administered 2014-11-19: 1 mg via INTRAVENOUS
  Filled 2014-11-16: qty 1

## 2014-11-16 MED ORDER — SODIUM CHLORIDE 0.9 % IV SOLN
1000.0000 mL | INTRAVENOUS | Status: DC
  Administered 2014-11-16: 1000 mL via INTRAVENOUS

## 2014-11-16 MED ORDER — DEXTROSE 5 % IV SOLN
1.0000 g | Freq: Once | INTRAVENOUS | Status: AC
  Administered 2014-11-16: 1 g via INTRAVENOUS
  Filled 2014-11-16: qty 10

## 2014-11-16 MED ORDER — ADULT MULTIVITAMIN W/MINERALS CH
1.0000 | ORAL_TABLET | Freq: Every day | ORAL | Status: DC
  Administered 2014-11-16 – 2014-11-22 (×7): 1 via ORAL
  Filled 2014-11-16 (×7): qty 1

## 2014-11-16 MED ORDER — ENOXAPARIN SODIUM 40 MG/0.4ML ~~LOC~~ SOLN
40.0000 mg | SUBCUTANEOUS | Status: DC
  Administered 2014-11-16 – 2014-11-17 (×2): 40 mg via SUBCUTANEOUS
  Filled 2014-11-16 (×2): qty 0.4

## 2014-11-16 MED ORDER — ONDANSETRON HCL 4 MG/2ML IJ SOLN
4.0000 mg | Freq: Once | INTRAMUSCULAR | Status: AC
  Administered 2014-11-16: 4 mg via INTRAVENOUS
  Filled 2014-11-16: qty 2

## 2014-11-16 MED ORDER — SODIUM CHLORIDE 0.9 % IV BOLUS (SEPSIS)
2000.0000 mL | Freq: Once | INTRAVENOUS | Status: AC
  Administered 2014-11-16: 2000 mL via INTRAVENOUS

## 2014-11-16 MED ORDER — SODIUM CHLORIDE 0.9 % IV SOLN
INTRAVENOUS | Status: DC
  Administered 2014-11-16: 18:00:00 via INTRAVENOUS

## 2014-11-16 MED ORDER — LORAZEPAM 1 MG PO TABS: 1.0000 mg | ORAL_TABLET | Freq: Four times a day (QID) | ORAL | Status: AC | PRN

## 2014-11-16 NOTE — ED Notes (Signed)
Awake. Verbally responsive. A/O x4. Resp even and unlabored. No audible adventitious breath sounds noted. ABC's intact. IV continues to infuse NS without difficulty.

## 2014-11-16 NOTE — ED Provider Notes (Signed)
CSN: [id number]     Arrival date & time 11/16/14  1211 History   First MD Initiated Contact with Patient 11/16/14 1322     Chief Complaint  Patient presents with  . Abdominal Pain     (Consider location/radiation/quality/duration/timing/severity/associated sxs/prior Treatment) HPI  54 year old male with abdominal pain and dysuria. Patient is not sure exactly onset of symptoms but at least been ongoing for the past 2 days. Feels nauseated, no vomiting. No fever. Mild lower abdominal pain. Not compliant with medications. Pt is not a good historian. Not sure if this is his baseline or if he may be somewhat encephalopathic. I initially spoke with him before I had reviewed his medication list and asked him if he took insulin. He said he did but hasn't had any in the last five days. No insulin listed on his medication list though. Metformin is listed and when asked about it he had the same reply. He seems either confused or I suspect more likely that simply noncompliant.    Past Medical History  Diagnosis Date  . Diabetes mellitus without complication   . Hypertension    History reviewed. No pertinent past surgical history. Family History  Problem Relation Age of Onset  . Hypertension Mother   . Diabetes Sister   . Diabetes Brother    History  Substance Use Topics  . Smoking status: Former Games developer  . Smokeless tobacco: Not on file  . Alcohol Use: Yes    Review of Systems  All systems reviewed and negative, other than as noted in HPI.   Allergies  Review of patient's allergies indicates no known allergies.  Home Medications   Prior to Admission medications   Medication Sig Start Date End Date Taking? Authorizing Provider  acetaminophen (TYLENOL) 500 MG tablet Take 500 mg by mouth every 6 (six) hours as needed for mild pain.   Yes Historical Provider, MD  hydrochlorothiazide (HYDRODIURIL) 25 MG tablet Take 1 tablet (25 mg total) by mouth daily. 02/23/14  Yes Raeford Razor, MD   metFORMIN (GLUCOPHAGE) 500 MG tablet Take 1 tablet (500 mg total) by mouth 2 (two) times daily with a meal. 02/23/14  Yes Raeford Razor, MD  aspirin 81 MG chewable tablet Chew 4 tablets (324 mg total) by mouth daily. Patient not taking: Reported on 10/09/2014 03/06/14   Richardean Canal, MD  cephALEXin (KEFLEX) 500 MG capsule Take 1 capsule (500 mg total) by mouth 4 (four) times daily. Patient not taking: Reported on 11/16/2014 10/09/14   Ladona Mow, PA-C  levETIRAcetam (KEPPRA) 500 MG tablet Take 1 tablet (500 mg total) by mouth 2 (two) times daily. Patient not taking: Reported on 10/09/2014 03/06/14   Richardean Canal, MD  lisinopril (PRINIVIL,ZESTRIL) 5 MG tablet Take 4 tablets (20 mg total) by mouth daily. Patient not taking: Reported on 10/09/2014 02/23/14   Raeford Razor, MD  sulfamethoxazole-trimethoprim (BACTRIM DS,SEPTRA DS) 800-160 MG per tablet Take 1 tablet by mouth 2 (two) times daily. Patient not taking: Reported on 11/16/2014 10/09/14   Ladona Mow, PA-C  traMADol (ULTRAM) 50 MG tablet Take 1 tablet (50 mg total) by mouth every 6 (six) hours as needed. Patient not taking: Reported on 11/16/2014 10/05/14   Garlon Hatchet, PA-C   BP 152/77 mmHg  Pulse 103  Temp(Src) 98.3 F (36.8 C) (Oral)  Resp 18  SpO2 97% Physical Exam  Constitutional: He appears well-developed and well-nourished. No distress.  HENT:  Head: Normocephalic and atraumatic.  Eyes: Conjunctivae are normal. Right eye exhibits  no discharge. Left eye exhibits no discharge.  Neck: Neck supple.  Cardiovascular: Regular rhythm and normal heart sounds.  Exam reveals no gallop and no friction rub.   No murmur heard. tachycardic  Pulmonary/Chest: Effort normal and breath sounds normal. No respiratory distress.  Abdominal: Soft. He exhibits no distension. There is tenderness. There is no rebound and no guarding.  mild diffuse abdominal tenderness.   Musculoskeletal: He exhibits no edema or tenderness.  Neurological: He is alert.  Skin:  Skin is warm and dry.  Psychiatric: He has a normal mood and affect. His behavior is normal. Thought content normal.  Nursing note and vitals reviewed.   ED Course  Procedures (including critical care time)  CRITICAL CARE Performed by: Raeford RazorKOHUT, Colletta Spillers Total critical care time: 35 minutes Critical care time was exclusive of separately billable procedures and treating other patients. Critical care was necessary to treat or prevent imminent or life-threatening deterioration. Critical care was time spent personally by me on the following activities: development of treatment plan with patient and/or surrogate as well as nursing, discussions with consultants, evaluation of patient's response to treatment, examination of patient, obtaining history from patient or surrogate, ordering and performing treatments and interventions, ordering and review of laboratory studies, ordering and review of radiographic studies, pulse oximetry and re-evaluation of patient's condition.  Labs Review Labs Reviewed  URINALYSIS, ROUTINE W REFLEX MICROSCOPIC (NOT AT [facility]) - Abnormal; Notable for the following:    APPearance CLOUDY (*)    Glucose, UA >1000 (*)    Hgb urine dipstick MODERATE (*)    Ketones, ur 15 (*)    Protein, ur 30 (*)    Leukocytes, UA MODERATE (*)    All other components within normal limits  URINE MICROSCOPIC-ADD ON - Abnormal; Notable for the following:    Bacteria, UA MANY (*)    Casts HYALINE CASTS (*)    All other components within normal limits  BASIC METABOLIC PANEL - Abnormal; Notable for the following:    Sodium 126 (*)    Chloride 87 (*)    Glucose, Bld 523 (*)    BUN 56 (*)    Creatinine, Ser 1.56 (*)    Calcium 7.9 (*)    GFR calc non Af Amer 49 (*)    GFR calc Af Amer 57 (*)    Anion gap 16 (*)    All other components within normal limits  CBC WITH DIFFERENTIAL/PLATELET - Abnormal; Notable for the following:    WBC 31.3 (*)    Hemoglobin 11.5 (*)    HCT 34.3 (*)     Neutrophils Relative % 84 (*)    Lymphocytes Relative 2 (*)    Neutro Abs 28.8 (*)    Lymphs Abs 0.6 (*)    Monocytes Absolute 1.9 (*)    All other components within normal limits  BLOOD GAS, VENOUS - Abnormal; Notable for the following:    pH, Ven 7.378 (*)    pCO2, Ven 39.3 (*)    pO2, Ven 51.6 (*)    All other components within normal limits  CBG MONITORING, ED - Abnormal; Notable for the following:    Glucose-Capillary 530 (*)    All other components within normal limits  URINE CULTURE    Imaging Review No results found.   EKG Interpretation None      MDM   Final diagnoses:  Diabetes mellitus with hyperosmolarity  AKI (acute kidney injury)  Dehydration  UTI (lower urinary tract infection)    54yM  with hyperglycemia. Labs drawn after he had already had almost a liter of IVF. Small anion gap and some ketones on UA. Normal serum bicarb. History of diabetes and suspect noncompliant. UTI likely cause of acutely elevated glucose. Abx. IVF. Insulin gtt. Admission.     Raeford Razor, MD 11/18/14 [phone]

## 2014-11-16 NOTE — ED Notes (Signed)
Awake. Verbally responsive. A/O x4. Resp even and unlabored. No audible adventitious breath sounds noted. ABC's intact. IV infusing NS at 999ml/hr without difficulty. 

## 2014-11-16 NOTE — ED Notes (Signed)
Awake. Verbally responsive. A/O x4. Resp even and unlabored. No audible adventitious breath sounds noted. ABC's intact. IV patent and intact infusing NS without difficulty.

## 2014-11-16 NOTE — Progress Notes (Signed)
54 yr old uninsured [place] pt c/o abdominal pain 11/16/14  Pt [facility] by [facility] CM. Pt was released from prison in July 2015 Pt was homeless in 2015, no income, and no current medical provider.  He is knowledgeable of the [facility]. He was given a MATCH letter 03/06/14  Pt with 3 ED visits in the last 6 months   CM discussed and provided written information for self pay pcps, discussed the importance of pcp vs EDP services for f/u care, www.needymeds.org, www.goodrx.com, discounted pharmacies and other [place] resources such as [facility] , [facility], affordable care act,  [place] med assist, financial assistance, self pay dental services, [place] med assist, DSS and  health department  Reviewed resources for [place] self pay pcps like [facility], family medicine at [address], [facility], [facility], local urgent care centers, [facility], [facility] family practice, general medical clinics, family services of the [place], [facility] urgent care plus others, medication resources, [facility] out patient pharmacies and housing Pt voiced understanding and appreciation of resources provided   Provided [facility] contact information Pt agreed to a referral Cm completed referral Pt to be contact by [facility] clinical liason

## 2014-11-16 NOTE — H&P (Signed)
[facility] History and Physical  Marc [name] WUJ:[id number] DOB: 23-May-1960 DOA: 11/16/2014  Referring physician: EDP PCP: No PCP Per Patient   Chief Complaint: Came for abdominal pain   HPI: Marc Rosierrvin L Piatt is a 54 y.o. male with prior h/o hypertension, type 2 diabetes Mellitus, non compliant to medications, smoker and takes alcohol presents with abdominal pain, difficulty urination,  Burning urination and nausea. His labs revealed elevated cbg in 500's, mild anion gap, and normal bicarb. His UA appears dirty. He was referred to medical service for admission.    Review of Systems:  Constitutional:  Chills and fatigue HEENT:  No headaches, Difficulty swallowing,Tooth/dental problems,Sore throat,  No sneezing, itching, ear ache, nasal congestion, post nasal drip,  Cardio-vascular:  No chest pain, Orthopnea, PND, swelling in lower extremities, anasarca, dizziness, palpitations  GI:  Nausea, abdominal pain.  Resp:  No shortness of breath with exertion or at rest. No excess mucus, no productive cough, No non-productive cough, No coughing up of blood.No change in color of mucus.No wheezing.No chest wall deformity  Skin:  no rash or lesions.  GU:  Dysuria and difficulty urination.  Musculoskeletal:  No joint pain or swelling. No decreased range of motion. No back pain.    Past Medical History  Diagnosis Date  . Diabetes mellitus without complication   . Hypertension    History reviewed. No pertinent past surgical history. Social History:  reports that he has quit smoking. He does not have any smokeless tobacco history on file. He reports that he drinks alcohol. He reports that he does not use illicit drugs.  No Known Allergies  Family History  Problem Relation Age of Onset  . Hypertension Mother   . Diabetes Sister   . Diabetes Brother    do not leave blank  Prior to Admission medications   Medication Sig Start Date End Date Taking? Authorizing Provider    acetaminophen (TYLENOL) 500 MG tablet Take 500 mg by mouth every 6 (six) hours as needed for mild pain.   Yes Historical Provider, MD  hydrochlorothiazide (HYDRODIURIL) 25 MG tablet Take 1 tablet (25 mg total) by mouth daily. 02/23/14  Yes Raeford RazorStephen Kohut, MD  metFORMIN (GLUCOPHAGE) 500 MG tablet Take 1 tablet (500 mg total) by mouth 2 (two) times daily with a meal. 02/23/14  Yes Raeford RazorStephen Kohut, MD  aspirin 81 MG chewable tablet Chew 4 tablets (324 mg total) by mouth daily. Patient not taking: Reported on 10/09/2014 03/06/14   Richardean Canalavid H Yao, MD  cephALEXin (KEFLEX) 500 MG capsule Take 1 capsule (500 mg total) by mouth 4 (four) times daily. Patient not taking: Reported on 11/16/2014 10/09/14   Ladona MowJoe Mintz, PA-C  levETIRAcetam (KEPPRA) 500 MG tablet Take 1 tablet (500 mg total) by mouth 2 (two) times daily. Patient not taking: Reported on 10/09/2014 03/06/14   Richardean Canalavid H Yao, MD  lisinopril (PRINIVIL,ZESTRIL) 5 MG tablet Take 4 tablets (20 mg total) by mouth daily. Patient not taking: Reported on 10/09/2014 02/23/14   Raeford RazorStephen Kohut, MD  sulfamethoxazole-trimethoprim (BACTRIM DS,SEPTRA DS) 800-160 MG per tablet Take 1 tablet by mouth 2 (two) times daily. Patient not taking: Reported on 11/16/2014 10/09/14   Ladona MowJoe Mintz, PA-C  traMADol (ULTRAM) 50 MG tablet Take 1 tablet (50 mg total) by mouth every 6 (six) hours as needed. Patient not taking: Reported on 11/16/2014 10/05/14   Garlon HatchetLisa M Sanders, PA-C   Physical Exam: Filed Vitals:   11/16/14 1600 11/16/14 1615 11/16/14 1735 11/16/14 1744  BP: 131/54  144/65  Pulse:  115 114   Temp:   98.2 F (36.8 C)   TempSrc:   Oral   Resp:   18   Height:     (1.803 m)  Weight:    86.3 kg (190 lb 4.1 oz)  SpO2:  94% 94%     Wt Readings from Last 3 Encounters:  11/16/14 86.3 kg (190 lb 4.1 oz)  10/05/14 89.132 kg (196 lb 8 oz)  04/09/14 92.08 kg (203 lb)    General:  Appears calm and comfortable Eyes: PERRL, normal lids, irises & conjunctiva Neck: no LAD, masses or  thyromegaly Cardiovascular: tachycardia no m/r/g. No LE edema. Telemetry: SR, no arrhythmias  Respiratory: CTA bilaterally, no w/r/r. Normal respiratory effort. Abdomen: soft, ntnd Skin: blisters on his legs. Musculoskeletal: grossly normal tone BUE/BLE Neurologic: grossly non-focal.          Labs on Admission:  Basic Metabolic Panel:  Recent Labs Lab 11/16/14 1453  NA 126*  K 4.1  CL 87*  CO2 23  GLUCOSE 523*  BUN 56*  CREATININE 1.56*  CALCIUM 7.9*   Liver Function Tests: No results for input(s): AST, ALT, ALKPHOS, BILITOT, PROT, ALBUMIN in the last 168 hours. No results for input(s): LIPASE, AMYLASE in the last 168 hours. No results for input(s): AMMONIA in the last 168 hours. CBC:  Recent Labs Lab 11/16/14 1453  WBC 31.3*  NEUTROABS 28.8*  HGB 11.5*  HCT 34.3*  MCV 80.7  PLT 234   Cardiac Enzymes: No results for input(s): CKTOTAL, CKMB, CKMBINDEX, TROPONINI in the last 168 hours.  BNP (last 3 results) No results for input(s): BNP in the last 8760 hours.  ProBNP (last 3 results) No results for input(s): PROBNP in the last 8760 hours.  CBG:  Recent Labs Lab 11/16/14 1457 11/16/14 1628 11/16/14 1754  GLUCAP 530* 437* 477*    Radiological Exams on Admission: No results found.  EKG: pending.   Assessment/Plan Active Problems:   Hyperglycemia   Hyperglycemia due to type 2 diabetes mellitus   1. Mild DKA: Probably from UTI, and dehydration, and non compliant to medications.  Admitted to telemetry. Started on gluco stabilizer.  hgba1c ordered. IV fluids with NS ordered.  BMP every 6 hours .   2. Urinary tract infection: Urine cultures ordered and rocephin.   3. Hyponatremia: Probably from hyperglycemia and dehydration.   4. Acute renal failure: Get US renal, and start IV fluids and repeat in am.   5. Abdominal pain: - unclear etiology . Get liver panel, lipase and US abdomen.   6. Leukocytosis: Probably from UTI. Repeat in  am.       Code Status: full code.  DVT Prophylaxis: Family Communication: none at bedside.  Disposition Plan: admit to telemetry.  Time spent: 65 min  [facility] Pager [phone]

## 2014-11-16 NOTE — ED Notes (Signed)
CBG 530. RN made aware

## 2014-11-16 NOTE — ED Notes (Addendum)
Pt arrived via EMS with report of l;ower abd pain without n/v/d. Pt reported dysuria, lower back/abd pain, pain with void, foul odor and urinary frequency/urgency. LBM x1week ago. CBG with EMS 510. Pt was given IV NS and Zofran IV .

## 2014-11-16 NOTE — ED Notes (Signed)
Bed: GN56 Expected date:  Expected time:  Means of arrival:  Comments: Ems- 54 yo- abd pain- hyperglycemia- 1157

## 2014-11-17 ENCOUNTER — Inpatient Hospital Stay (HOSPITAL_COMMUNITY): Payer: Medicaid Other

## 2014-11-17 DIAGNOSIS — E871 Hypo-osmolality and hyponatremia: Secondary | ICD-10-CM | POA: Diagnosis present

## 2014-11-17 DIAGNOSIS — E876 Hypokalemia: Secondary | ICD-10-CM | POA: Diagnosis present

## 2014-11-17 DIAGNOSIS — I10 Essential (primary) hypertension: Secondary | ICD-10-CM | POA: Diagnosis present

## 2014-11-17 DIAGNOSIS — N179 Acute kidney failure, unspecified: Secondary | ICD-10-CM | POA: Diagnosis present

## 2014-11-17 LAB — BASIC METABOLIC PANEL
ANION GAP: 11 (ref 5–15)
ANION GAP: 14 (ref 5–15)
Anion gap: 14 (ref 5–15)
BUN: 63 mg/dL — ABNORMAL HIGH (ref 6–20)
BUN: 67 mg/dL — ABNORMAL HIGH (ref 6–20)
BUN: 78 mg/dL — ABNORMAL HIGH (ref 6–20)
CALCIUM: 7.8 mg/dL — AB (ref 8.9–10.3)
CALCIUM: 7.7 mg/dL — AB (ref 8.9–10.3)
CALCIUM: 8.1 mg/dL — AB (ref 8.9–10.3)
CHLORIDE: 94 mmol/L — AB (ref 101–111)
CHLORIDE: 91 mmol/L — AB (ref 101–111)
CO2: 24 mmol/L (ref 22–32)
CO2: 22 mmol/L (ref 22–32)
CO2: 22 mmol/L (ref 22–32)
CREATININE: 2.69 mg/dL — AB (ref 0.61–1.24)
Chloride: 91 mmol/L — ABNORMAL LOW (ref 101–111)
Creatinine, Ser: 2.02 mg/dL — ABNORMAL HIGH (ref 0.61–1.24)
Creatinine, Ser: 2.47 mg/dL — ABNORMAL HIGH (ref 0.61–1.24)
GFR calc Af Amer: 33 mL/min — ABNORMAL LOW (ref >60)
GFR calc non Af Amer: 28 mL/min — ABNORMAL LOW (ref >60)
GFR calc non Af Amer: 26 mL/min — ABNORMAL LOW (ref >60)
GFR, EST AFRICAN AMERICAN: 42 mL/min — AB (ref >60)
GFR, EST AFRICAN AMERICAN: 30 mL/min — AB (ref >60)
GFR, EST NON AFRICAN AMERICAN: 36 mL/min — AB (ref >60)
GLUCOSE: 175 mg/dL — AB (ref 65–99)
Glucose, Bld: 170 mg/dL — ABNORMAL HIGH (ref 65–99)
Glucose, Bld: 254 mg/dL — ABNORMAL HIGH (ref 65–99)
Potassium: 3.5 mmol/L (ref 3.5–5.1)
Potassium: 3.4 mmol/L — ABNORMAL LOW (ref 3.5–5.1)
Potassium: 3.4 mmol/L — ABNORMAL LOW (ref 3.5–5.1)
Sodium: 129 mmol/L — ABNORMAL LOW (ref 135–145)
Sodium: 127 mmol/L — ABNORMAL LOW (ref 135–145)
Sodium: 127 mmol/L — ABNORMAL LOW (ref 135–145)

## 2014-11-17 LAB — CBC
HCT: 32.9 % — ABNORMAL LOW (ref 39.0–52.0)
HEMOGLOBIN: 11 g/dL — AB (ref 13.0–17.0)
MCH: 26.6 pg (ref 26.0–34.0)
MCHC: 33.4 g/dL (ref 30.0–36.0)
MCV: 79.5 fL (ref 78.0–100.0)
Platelets: 211 10*3/uL (ref 150–400)
RBC: 4.14 MIL/uL — AB (ref 4.22–5.81)
RDW: 13.9 % (ref 11.5–15.5)
WBC: 27.5 10*3/uL — AB (ref 4.0–10.5)

## 2014-11-17 LAB — GLUCOSE, CAPILLARY
GLUCOSE-CAPILLARY: 239 mg/dL — AB (ref 65–99)
GLUCOSE-CAPILLARY: 170 mg/dL — AB (ref 65–99)
GLUCOSE-CAPILLARY: 138 mg/dL — AB (ref 65–99)
GLUCOSE-CAPILLARY: 217 mg/dL — AB (ref 65–99)
Glucose-Capillary: 154 mg/dL — ABNORMAL HIGH (ref 65–99)
Glucose-Capillary: 155 mg/dL — ABNORMAL HIGH (ref 65–99)
Glucose-Capillary: 156 mg/dL — ABNORMAL HIGH (ref 65–99)
Glucose-Capillary: 156 mg/dL — ABNORMAL HIGH (ref 65–99)
Glucose-Capillary: 161 mg/dL — ABNORMAL HIGH (ref 65–99)
Glucose-Capillary: 248 mg/dL — ABNORMAL HIGH (ref 65–99)
Glucose-Capillary: 250 mg/dL — ABNORMAL HIGH (ref 65–99)
Glucose-Capillary: 259 mg/dL — ABNORMAL HIGH (ref 65–99)
Glucose-Capillary: 263 mg/dL — ABNORMAL HIGH (ref 65–99)

## 2014-11-17 LAB — CREATININE, URINE, RANDOM: CREATININE, URINE: 72.95 mg/dL

## 2014-11-17 LAB — SODIUM, URINE, RANDOM: Sodium, Ur: 18 mmol/L

## 2014-11-17 LAB — HIV ANTIBODY (ROUTINE TESTING W REFLEX): HIV SCREEN 4TH GENERATION: NONREACTIVE

## 2014-11-17 MED ORDER — INSULIN ASPART 100 UNIT/ML ~~LOC~~ SOLN
2.0000 [IU] | Freq: Three times a day (TID) | SUBCUTANEOUS | Status: DC
  Administered 2014-11-18: 2 [IU] via SUBCUTANEOUS

## 2014-11-17 MED ORDER — SODIUM CHLORIDE 0.9 % IV SOLN
INTRAVENOUS | Status: DC
  Administered 2014-11-17 – 2014-11-21 (×9): via INTRAVENOUS

## 2014-11-17 MED ORDER — INSULIN ASPART 100 UNIT/ML ~~LOC~~ SOLN
0.0000 [IU] | Freq: Three times a day (TID) | SUBCUTANEOUS | Status: DC
Start: 2014-11-17 — End: 2014-11-22
  Administered 2014-11-17: 3 [IU] via SUBCUTANEOUS
  Administered 2014-11-17 (×2): 5 [IU] via SUBCUTANEOUS
  Administered 2014-11-17: 2 [IU] via SUBCUTANEOUS
  Administered 2014-11-18: 3 [IU] via SUBCUTANEOUS
  Administered 2014-11-18: 11 [IU] via SUBCUTANEOUS
  Administered 2014-11-18: 5 [IU] via SUBCUTANEOUS
  Administered 2014-11-19 (×2): 3 [IU] via SUBCUTANEOUS
  Administered 2014-11-19: 5 [IU] via SUBCUTANEOUS
  Administered 2014-11-20 (×2): 3 [IU] via SUBCUTANEOUS
  Administered 2014-11-21: 2 [IU] via SUBCUTANEOUS

## 2014-11-17 MED ORDER — INSULIN GLARGINE 100 UNIT/ML ~~LOC~~ SOLN
20.0000 [IU] | Freq: Every day | SUBCUTANEOUS | Status: DC
  Administered 2014-11-17 (×2): 20 [IU] via SUBCUTANEOUS
  Filled 2014-11-17 (×2): qty 0.2

## 2014-11-17 NOTE — Progress Notes (Addendum)
Pt transitioned off of Glucostabilzer at 0400. Pt had received 20 units of Lantus 2 hours prior to being taken of drip. CBG at time of removal was 170 and was covered with 3 units of Novolog. Pt reports no pain or nausea. Pt's diet was advanced to Carb Mod by K. Schorr and placed on a sliding scale for insulin coverage. Pt is resting. VS have been stable during this shift. U/S called to state that renal U/S will be completed during day shift.

## 2014-11-17 NOTE — Progress Notes (Signed)
[facility] PROGRESS NOTE  Marc [name] WUJ:[id number] DOB: 1960/06/11 DOA: 11/16/2014 PCP: No PCP Per Patient Interim summary: 54 year old male admitted for abdominal pain and DKA. Assessment/Plan: 1. DKA: Admitted to telemetry. Started on glucostabilizer, transitioned to sq lantus. On 20 units of lantus and ssi.  CBG (last 3)   Recent Labs  11/17/14 0718 11/17/14 1137 11/17/14 1652  GLUCAP 138* 217* 248*    hgba1c is pending.   2. Acute on CKD: US renal showed sig debris, no hydronephrosis. Mild bilateral renal collecting system dilatation.  If renal function does not improve by am, please call renal for further evaluation.    3. Pyelonephritis: Urine cultures ordered and on rocpehin.   4. Hypertension: controlled.   5. Hypokalemia: replete as needed.   6. Hyponatremia: Slight improvement.  Recheck in am. Tsh, urine and serum osmolarity ordered.     Code Status: full code.  Family Communication: none at bedside.  Disposition Plan: pending, remain inpatient.    Consultants:  none  Procedures:  US RENAL.  Antibiotics:  rocephin 6/27  HPI/Subjective:reports feeling better than yesterday.  Objective: Filed Vitals:   11/17/14 1340  BP: 129/69  Pulse: 97  Temp: 98.2 F (36.8 C)  Resp: 20    Intake/Output Summary (Last 24 hours) at 11/17/14 1953 Last data filed at 11/17/14 1748  Gross per 24 hour  Intake 870.62 ml  Output    300 ml  Net 570.62 ml   Filed Weights   11/16/14 1744  Weight: 86.3 kg (190 lb 4.1 oz)    Exam:   General:  Alert afebrile comfortable  Cardiovascular: s1s2  Respiratory: clear to auscultation, no wheezing or rhonchi  Abdomen: soft non tender non distended bowel sounds heard.   Musculoskeletal: no pedal edema  Skin: pus filled blisters on the knees.     Data Reviewed: Basic Metabolic Panel:  Recent Labs Lab 11/16/14 1453 11/16/14 2010 11/17/14 0121 11/17/14 0718 11/17/14 1315  NA 126*  130* 129* 127* 127*  K 4.1 3.5 3.5 3.4* 3.4*  CL 87* 92* 94* 91* 91*  CO2 GLUCOSE 523* 250* 170* 175* 254*  BUN 56* 56* 63* 67* 78*  CREATININE 1.56* 1.74* 2.02* 2.47* 2.69*  CALCIUM 7.9* 8.1* 7.8* 7.7* 8.1*   Liver Function Tests:  Recent Labs Lab 11/16/14 2010  AST 37  ALT 62  ALKPHOS 161*  BILITOT 0.9  PROT 7.4  ALBUMIN 2.7*    Recent Labs Lab 11/16/14 2048  LIPASE 34   No results for input(s): AMMONIA in the last 168 hours. CBC:  Recent Labs Lab 11/16/14 1453 11/17/14 0809  WBC 31.3* 27.5*  NEUTROABS 28.8*  --   HGB 11.5* 11.0*  HCT 34.3* 32.9*  MCV 80.7 79.5  PLT 234 211   Cardiac Enzymes: No results for input(s): CKTOTAL, CKMB, CKMBINDEX, TROPONINI in the last 168 hours. BNP (last 3 results) No results for input(s): BNP in the last 8760 hours.  ProBNP (last 3 results) No results for input(s): PROBNP in the last 8760 hours.  CBG:  Recent Labs Lab 11/17/14 0257 11/17/14 0350 11/17/14 0718 11/17/14 1137 11/17/14 1652  GLUCAP 155* 170* 138* 217* 248*    Recent Results (from the past 240 hour(s))  Urine culture     Status: None (Preliminary result)   Collection Time: 11/16/14 12:11 PM  Result Value Ref Range Status   Specimen Description URINE, CLEAN CATCH  Final   Special Requests NONE  Final  Culture   Final    CULTURE REINCUBATED FOR BETTER GROWTH [facility]    Report Status PENDING  Incomplete     Studies: [place] Renal  11/17/2014   CLINICAL DATA:  Acute renal failure, history hypertension, diabetes  EXAM: RENAL / URINARY TRACT ULTRASOUND COMPLETE  COMPARISON:  None  FINDINGS: Right Kidney:  Length: 11.4 cm. Upper normal cortical echogenicity. Normal cortical thickness. Mild collecting system dilatation. No renal mass or shadowing calcification.  Left Kidney:  Length: 13.0 cm. Normal cortical thickness with slight fetal lobulation noted. Slightly increased cortical echogenicity. Mild collecting system  dilatation. No mass or shadowing calcification.  Bladder:  Distended, containing significant debris.  No gross/discrete mass.  IMPRESSION: Distended urinary bladder containing significant debris.  Mild BILATERAL renal collecting system dilatation though uncertain if this could be related to bladder distention.   Electronically Signed   By: Ulyses SouthwardMark  Boles M.D.   On: 11/17/2014 10:11    Scheduled Meds: . cefTRIAXone (ROCEPHIN)  IV  1 g Intravenous Q24H  . enoxaparin (LOVENOX) injection  40 mg Subcutaneous Q24H  . folic acid  1 mg Oral Daily  . insulin aspart  0-15 Units Subcutaneous TID WC  . insulin glargine  20 Units Subcutaneous QHS  . multivitamin with minerals  1 tablet Oral Daily  . thiamine  100 mg Oral Daily   Or  . thiamine  100 mg Intravenous Daily   Continuous Infusions: . sodium chloride 100 mL/hr at 11/17/14 1500    Active Problems:   Hyperglycemia   Hyperglycemia due to type 2 diabetes mellitus    Time spent: 25 minutes.     [facility] Hospital[facility]ULA,Camaria Gerald  [facility] Pager [phone] If 7PM-7AM, please contact night-coverage at [email or web address], password [email or web address] 11/17/2014, 7:53 PM  LOS: 1 day

## 2014-11-18 ENCOUNTER — Inpatient Hospital Stay (HOSPITAL_COMMUNITY): Payer: Medicaid Other

## 2014-11-18 DIAGNOSIS — N139 Obstructive and reflux uropathy, unspecified: Secondary | ICD-10-CM

## 2014-11-18 DIAGNOSIS — E871 Hypo-osmolality and hyponatremia: Secondary | ICD-10-CM

## 2014-11-18 DIAGNOSIS — E1165 Type 2 diabetes mellitus with hyperglycemia: Secondary | ICD-10-CM

## 2014-11-18 LAB — TSH: TSH: 1.053 u[IU]/mL (ref 0.350–4.500)

## 2014-11-18 LAB — CBC
HCT: 31.8 % — ABNORMAL LOW (ref 39.0–52.0)
HEMOGLOBIN: 10.9 g/dL — AB (ref 13.0–17.0)
MCH: 27 pg (ref 26.0–34.0)
MCHC: 34.3 g/dL (ref 30.0–36.0)
MCV: 78.7 fL (ref 78.0–100.0)
PLATELETS: 187 10*3/uL (ref 150–400)
RBC: 4.04 MIL/uL — AB (ref 4.22–5.81)
RDW: 14.2 % (ref 11.5–15.5)
WBC: 24.1 10*3/uL — ABNORMAL HIGH (ref 4.0–10.5)

## 2014-11-18 LAB — OSMOLALITY, URINE: OSMOLALITY UR: 385 mosm/kg — AB (ref 390–1090)

## 2014-11-18 LAB — BASIC METABOLIC PANEL
ANION GAP: 13 (ref 5–15)
BUN: 91 mg/dL — ABNORMAL HIGH (ref 6–20)
CO2: 21 mmol/L — ABNORMAL LOW (ref 22–32)
Calcium: 7.7 mg/dL — ABNORMAL LOW (ref 8.9–10.3)
Chloride: 94 mmol/L — ABNORMAL LOW (ref 101–111)
Creatinine, Ser: 3.12 mg/dL — ABNORMAL HIGH (ref 0.61–1.24)
GFR, EST AFRICAN AMERICAN: 25 mL/min — AB (ref >60)
GFR, EST NON AFRICAN AMERICAN: 21 mL/min — AB (ref >60)
Glucose, Bld: 285 mg/dL — ABNORMAL HIGH (ref 65–99)
POTASSIUM: 3.5 mmol/L (ref 3.5–5.1)
Sodium: 128 mmol/L — ABNORMAL LOW (ref 135–145)

## 2014-11-18 LAB — GLUCOSE, CAPILLARY
GLUCOSE-CAPILLARY: 319 mg/dL — AB (ref 65–99)
GLUCOSE-CAPILLARY: 174 mg/dL — AB (ref 65–99)
Glucose-Capillary: 230 mg/dL — ABNORMAL HIGH (ref 65–99)
Glucose-Capillary: 192 mg/dL — ABNORMAL HIGH (ref 65–99)

## 2014-11-18 LAB — PROCALCITONIN: PROCALCITONIN: 29.34 ng/mL

## 2014-11-18 LAB — URINE CULTURE

## 2014-11-18 LAB — HEMOGLOBIN A1C
Hgb A1c MFr Bld: 12.1 % — ABNORMAL HIGH (ref 4.8–5.6)
Mean Plasma Glucose: 301 mg/dL

## 2014-11-18 LAB — SODIUM, URINE, RANDOM

## 2014-11-18 LAB — OSMOLALITY: OSMOLALITY: 309 mosm/kg — AB (ref 275–300)

## 2014-11-18 LAB — CORTISOL: CORTISOL PLASMA: 31.9 ug/dL

## 2014-11-18 LAB — CREATININE, URINE, RANDOM: CREATININE, URINE: 68.33 mg/dL

## 2014-11-18 MED ORDER — INSULIN ASPART 100 UNIT/ML ~~LOC~~ SOLN
5.0000 [IU] | Freq: Three times a day (TID) | SUBCUTANEOUS | Status: DC
  Administered 2014-11-18 – 2014-11-19 (×3): 5 [IU] via SUBCUTANEOUS

## 2014-11-18 MED ORDER — LABETALOL HCL 5 MG/ML IV SOLN
10.0000 mg | Freq: Four times a day (QID) | INTRAVENOUS | Status: DC | PRN
  Administered 2014-11-18 – 2014-11-22 (×4): 10 mg via INTRAVENOUS
  Filled 2014-11-18 (×4): qty 4

## 2014-11-18 MED ORDER — PANTOPRAZOLE SODIUM 40 MG PO TBEC
40.0000 mg | DELAYED_RELEASE_TABLET | Freq: Every day | ORAL | Status: DC
  Administered 2014-11-18 – 2014-11-22 (×5): 40 mg via ORAL
  Filled 2014-11-18 (×5): qty 1

## 2014-11-18 MED ORDER — TAMSULOSIN HCL 0.4 MG PO CAPS
0.4000 mg | ORAL_CAPSULE | Freq: Every day | ORAL | Status: DC
  Administered 2014-11-18 – 2014-11-21 (×4): 0.4 mg via ORAL
  Filled 2014-11-18 (×4): qty 1

## 2014-11-18 MED ORDER — ALUM & MAG HYDROXIDE-SIMETH 200-200-20 MG/5ML PO SUSP
30.0000 mL | Freq: Four times a day (QID) | ORAL | Status: DC | PRN
  Administered 2014-11-18 – 2014-11-19 (×2): 30 mL via ORAL
  Filled 2014-11-18 (×2): qty 30

## 2014-11-18 MED ORDER — AMLODIPINE BESYLATE 5 MG PO TABS
5.0000 mg | ORAL_TABLET | Freq: Every day | ORAL | Status: DC
  Administered 2014-11-18 – 2014-11-20 (×3): 5 mg via ORAL
  Filled 2014-11-18 (×3): qty 1

## 2014-11-18 MED ORDER — INSULIN GLARGINE 100 UNIT/ML ~~LOC~~ SOLN
28.0000 [IU] | Freq: Every day | SUBCUTANEOUS | Status: DC
  Administered 2014-11-18: 28 [IU] via SUBCUTANEOUS
  Filled 2014-11-18: qty 0.28

## 2014-11-18 MED ORDER — HEPARIN SODIUM (PORCINE) 5000 UNIT/ML IJ SOLN
5000.0000 [IU] | Freq: Three times a day (TID) | INTRAMUSCULAR | Status: DC
  Administered 2014-11-18 – 2014-11-22 (×12): 5000 [IU] via SUBCUTANEOUS
  Filled 2014-11-18 (×11): qty 1

## 2014-11-18 NOTE — Care Management Note (Signed)
Case Management Note  Patient Details  Name: Marc [name] MRN: [id number] Date of Birth: 1961-04-02  Subjective/Objective:Pt admitted with ABD pain                   Action/Plan: from home and plan to return   Expected Discharge Date:   (unknown)               Expected Discharge Plan:  Home/Self Care  In-House Referral:     Discharge planning Services  CM Consult  Post Acute Care Choice:    Choice offered to:     DME Arranged:    DME Agency:     HH Arranged:    HH Agency:     Status of Service:  In process, will continue to follow  Medicare Important Message Given:    Date Medicare IM Given:    Medicare IM give by:    Date Additional Medicare IM Given:    Additional Medicare Important Message give by:     If discussed at Long Length of Stay Meetings, dates discussed:    Additional CommentsGeni Bers:  Lisandra Mathisen, RN 11/18/2014, 3:58 PM

## 2014-11-18 NOTE — Progress Notes (Addendum)
[facility] PROGRESS NOTE  BRAXTEN MEMMER ONG:[id number] DOB: [birth year] DOA: 11/16/2014 PCP: No PCP Per Patient  brief narrative 54 year old male with history of hypertension, uncontrolled type 2 diabetes mellitus, tobacco and alcohol use noncompliant with medications and not following up as outpatient presented with abdominal pain and difficulty /burning urination with nausea. Patient was found to be in mild DKA with CVG 500, anion gap. Uterus is history of UTI with concern for pyelonephritis. Patient  septic with possible UTI/pyelonephritis.    Assessment/Plan: Sepsis secondary to UTI/pyelonephritis  On empiric Rocephin. Pending culture and sensitivity. Continue IV hydration with normal saline. Denies abdominal pain but has significant distention. Ultrasound abdomen shows mild bilateral hydronephrosis and distended bladder with significant debris. Patient afebrile but has significant leukocytosis.  Acute tubular necrosis Both prerenal and obstructive uropathy. Has underlying diabetic nephropathy as well. Patient reports symptoms of BPH ( urgency, hesitancy and urge incontinence). Ultrasound abdomen with distended urinary bladder containing significant debris and bilateral mild hydronephrosis. Bladder scan done this morning showing 900 mL of urine. Foley placed in with significant diuresis. Monitor renal function closely.  FeNa of 0.5 .  IV hydration with normal saline. Add Flomax   Uncontrolled type 2 diabetes mellitus with DKA DKA now resolved but fingersticks still elevated.. A1c of 12.1. Increase Lantus and pre-meal aspart. His counseling on medication adherence and outpatient follow-up. Does not have a PCP. We'll request case manager to assist with establishing care at the wellness center.  Uncontrolled hypertension Place on prn labetalol and add amlodipine  Hyponatremia Likely secondary to dehydration and alcohol abuse. Monitor  with IV hydration. Check  CXR.    Alcohol abuse No signs of withdrawal. Monitor on CIWA. Continue thiamine, folate and multivitamin   Tobacco abuse Counseled on cessation   GERD  . Added Maalox and PPI.   Diet: Diabetic DVT prophylaxis: Subcutaneous heparin    Code Status: Full code Family Communication: none at Bedside  Disposition Plan: Currently inpatient.  Consultants: None   Procedures:  US abdomen  Antibiotics: Rocephin since 6/27   HPI/Subjective: Patient seen and examined. Denies any pain. Peripheral to difficulty urinating. Requesting to go home. Some confusion.   Objective: Filed Vitals:   11/18/14 1352  BP: 196/98  Pulse: 123  Temp: 98 F (36.7 C)  Resp: 25    Intake/Output Summary (Last 24 hours) at 11/18/14 1358 Last data filed at 11/18/14 1354  Gross per 24 hour  Intake   1950 ml  Output   3425 ml  Net  -1475 ml   Filed Weights   11/16/14 1744  Weight: 86.3 kg (190 lb 4.1 oz)    Exam:   General:  We did aged male in no acute distress  HEENT: No pallor, moist oral mucosa, supple neck  Chest: Clear to auscultation bilaterally, no added sounds  CVS: tachycardic, no murmurs rub or gallop  GI: Distended abdomen more pronounced in the suprapubic area, nontender, bowel sounds present, no CVA tenderness  Musculoskeletal: Warm, no edema  CNS: Alert and oriented but with episodic confusion   Data Reviewed: Basic Metabolic Panel:  Recent Labs Lab 11/16/14 2010 11/17/14 0121 11/17/14 0718 11/17/14 1315 11/18/14 0512  NA 130* 129* 127* 127* 128*  K 3.5 3.5 3.4* 3.4* 3.5  CL 92* 94* 91* 91* 94*  CO2 21*  GLUCOSE 250* 170* 175* 254* 285*  BUN 56* 63* 67* 78* 91*  CREATININE 1.74* 2.02* 2.47* 2.69* 3.12*  CALCIUM 8.1* 7.8* 7.7* 8.1* 7.7*  Liver Function Tests:  Recent Labs Lab 11/16/14 2010  AST 37  ALT 62  ALKPHOS 161*  BILITOT 0.9  PROT 7.4  ALBUMIN 2.7*    Recent Labs Lab 11/16/14 2048  LIPASE 34   No results for  input(s): AMMONIA in the last 168 hours. CBC:  Recent Labs Lab 11/16/14 1453 11/17/14 0809 11/18/14 0512  WBC 31.3* 27.5* 24.1*  NEUTROABS 28.8*  --   --   HGB 11.5* 11.0* 10.9*  HCT 34.3* 32.9* 31.8*  MCV 80.7 79.5 78.7  PLT 234 211 187   Cardiac Enzymes: No results for input(s): CKTOTAL, CKMB, CKMBINDEX, TROPONINI in the last 168 hours. BNP (last 3 results) No results for input(s): BNP in the last 8760 hours.  ProBNP (last 3 results) No results for input(s): PROBNP in the last 8760 hours.  CBG:  Recent Labs Lab 11/17/14 1137 11/17/14 1652 11/17/14 2157 11/18/14 0649 11/18/14 1143  GLUCAP 217* 248* 259* 319* 230*    Recent Results (from the past 240 hour(s))  Urine culture     Status: None   Collection Time: 11/16/14 12:11 PM  Result Value Ref Range Status   Specimen Description URINE, CLEAN CATCH  Final   Special Requests NONE  Final   Culture   Final    MULTIPLE SPECIES PRESENT, SUGGEST RECOLLECTION IF CLINICALLY INDICATED [facility]    Report Status 11/18/2014 FINAL  Final     Studies: [place] Renal  11/17/2014   CLINICAL DATA:  Acute renal failure, history hypertension, diabetes  EXAM: RENAL / URINARY TRACT ULTRASOUND COMPLETE  COMPARISON:  None  FINDINGS: Right Kidney:  Length: 11.4 cm. Upper normal cortical echogenicity. Normal cortical thickness. Mild collecting system dilatation. No renal mass or shadowing calcification.  Left Kidney:  Length: 13.0 cm. Normal cortical thickness with slight fetal lobulation noted. Slightly increased cortical echogenicity. Mild collecting system dilatation. No mass or shadowing calcification.  Bladder:  Distended, containing significant debris.  No gross/discrete mass.  IMPRESSION: Distended urinary bladder containing significant debris.  Mild BILATERAL renal collecting system dilatation though uncertain if this could be related to bladder distention.   Electronically Signed   By: Ulyses SouthwardMark  Boles M.D.   On:  11/17/2014 10:11    Scheduled Meds: . cefTRIAXone (ROCEPHIN)  IV  1 g Intravenous Q24H  . enoxaparin (LOVENOX) injection  40 mg Subcutaneous Q24H  . folic acid  1 mg Oral Daily  . insulin aspart  0-15 Units Subcutaneous TID WC  . insulin aspart  5 Units Subcutaneous TID WC  . insulin glargine  28 Units Subcutaneous QHS  . multivitamin with minerals  1 tablet Oral Daily  . pantoprazole  40 mg Oral Daily  . tamsulosin  0.4 mg Oral QPC supper  . thiamine  100 mg Oral Daily   Or  . thiamine  100 mg Intravenous Daily   Continuous Infusions: . sodium chloride 100 mL/hr at 11/17/14 1500       Time spent:35 minutes    Shanequa Whitenight  [facility] Pager [phone] If 7PM-7AM, please contact night-coverage at [email or web address], password [email or web address] 11/18/2014, 1:58 PM  LOS: 2 days

## 2014-11-19 LAB — BASIC METABOLIC PANEL
Anion gap: 8 (ref 5–15)
BUN: 56 mg/dL — ABNORMAL HIGH (ref 6–20)
CHLORIDE: 103 mmol/L (ref 101–111)
CO2: 26 mmol/L (ref 22–32)
Calcium: 7.6 mg/dL — ABNORMAL LOW (ref 8.9–10.3)
Creatinine, Ser: 1.5 mg/dL — ABNORMAL HIGH (ref 0.61–1.24)
GFR calc Af Amer: >60 mL/min (ref >60)
GFR, EST NON AFRICAN AMERICAN: 52 mL/min — AB (ref >60)
Glucose, Bld: 224 mg/dL — ABNORMAL HIGH (ref 65–99)
Potassium: 3.3 mmol/L — ABNORMAL LOW (ref 3.5–5.1)
SODIUM: 137 mmol/L (ref 135–145)

## 2014-11-19 LAB — CBC
HCT: 29.7 % — ABNORMAL LOW (ref 39.0–52.0)
Hemoglobin: 10.3 g/dL — ABNORMAL LOW (ref 13.0–17.0)
MCH: 27 pg (ref 26.0–34.0)
MCHC: 34.7 g/dL (ref 30.0–36.0)
MCV: 77.7 fL — ABNORMAL LOW (ref 78.0–100.0)
PLATELETS: 150 10*3/uL (ref 150–400)
RBC: 3.82 MIL/uL — ABNORMAL LOW (ref 4.22–5.81)
RDW: 14.2 % (ref 11.5–15.5)
WBC: 24.3 10*3/uL — ABNORMAL HIGH (ref 4.0–10.5)

## 2014-11-19 LAB — GLUCOSE, CAPILLARY
GLUCOSE-CAPILLARY: 191 mg/dL — AB (ref 65–99)
Glucose-Capillary: 215 mg/dL — ABNORMAL HIGH (ref 65–99)
Glucose-Capillary: 154 mg/dL — ABNORMAL HIGH (ref 65–99)
Glucose-Capillary: 166 mg/dL — ABNORMAL HIGH (ref 65–99)

## 2014-11-19 MED ORDER — POTASSIUM CHLORIDE CRYS ER 20 MEQ PO TBCR
40.0000 meq | EXTENDED_RELEASE_TABLET | Freq: Once | ORAL | Status: AC
  Administered 2014-11-19: 40 meq via ORAL
  Filled 2014-11-19: qty 2

## 2014-11-19 MED ORDER — INSULIN GLARGINE 100 UNIT/ML ~~LOC~~ SOLN
35.0000 [IU] | Freq: Every day | SUBCUTANEOUS | Status: DC
  Administered 2014-11-19 – 2014-11-21 (×3): 35 [IU] via SUBCUTANEOUS
  Filled 2014-11-19 (×3): qty 0.35

## 2014-11-19 MED ORDER — INSULIN ASPART 100 UNIT/ML ~~LOC~~ SOLN
7.0000 [IU] | Freq: Three times a day (TID) | SUBCUTANEOUS | Status: DC
Start: 2014-11-19 — End: 2014-11-22
  Administered 2014-11-19 – 2014-11-21 (×5): 7 [IU] via SUBCUTANEOUS

## 2014-11-19 MED ORDER — LIVING WELL WITH DIABETES BOOK
Freq: Once | Status: AC
  Administered 2014-11-19: 19:00:00
  Filled 2014-11-19: qty 1

## 2014-11-19 MED ORDER — INSULIN STARTER KIT- SYRINGES (ENGLISH)
1.0000 | Freq: Once | Status: AC
  Administered 2014-11-19: 1
  Filled 2014-11-19: qty 1

## 2014-11-19 MED ORDER — GLIMEPIRIDE 2 MG PO TABS
2.0000 mg | ORAL_TABLET | Freq: Every day | ORAL | Status: DC
  Administered 2014-11-20 – 2014-11-22 (×3): 2 mg via ORAL
  Filled 2014-11-19 (×5): qty 1

## 2014-11-19 NOTE — Progress Notes (Addendum)
Unable to make appointment for pt. Pt will need to call [facility] for appointment. Information given to pt. Also information given to pt for [facility] [place].

## 2014-11-19 NOTE — Progress Notes (Signed)
Inpatient Diabetes Program Recommendations  AACE/ADA: New Consensus Statement on Inpatient Glycemic Control (2013)  Target Ranges:  Prepandial:   less than 140 mg/dL      Peak postprandial:   less than 180 mg/dL (1-2 hours)      Critically ill patients:  140 - 180 mg/dL   54 y.o. male with prior h/o hypertension, type 2 diabetes Mellitus, non compliant to medications, smoker and takes alcohol presents with abdominal pain, difficulty urination, Burning urination and nausea.  Results for Marc, [name] (MRN [id number]) as of 11/19/2014 17:42  Ref. Range 11/19/2014 03:40  Sodium Latest Ref Range: 135-145 mmol/L 137  Potassium Latest Ref Range: 3.5-5.1 mmol/L 3.3 (L)  Chloride Latest Ref Range: 101-111 mmol/L 103  CO2 Latest Ref Range: 22-32 mmol/L 26  BUN Latest Ref Range: 6-20 mg/dL 56 (H)  Creatinine Latest Ref Range: 0.61-1.24 mg/dL 1.50 (H)  Calcium Latest Ref Range: 8.9-10.3 mg/dL 7.6 (L)  EGFR (Non-African Amer.) Latest Ref Range: >60 mL/min 52 (L)  EGFR (African American) Latest Ref Range: >60 mL/min >60  Glucose Latest Ref Range: 65-99 mg/dL 224 (H)  Anion gap Latest Ref Range: 5-15  8  Results for Marc, [name] (MRN [id number]) as of 11/19/2014 17:42  Ref. Range 11/16/2014 20:44  Hemoglobin A1C Latest Ref Range: 4.8-5.6 % 12.1 (H)   Uncontrolled DM. Will need to be discharged on insulin. Recommend affordable insulin with no insurance coverage. Will order insulin starter kit and RN to begin teaching insulin administration.  Inpatient Diabetes Program Recommendations Insulin - Basal: 70/30 25 units bid HgbA1C: 12.1  Note: Pt to call [facility] for appt to obtain PCP for management of blood sugars.  Will see in am for questions. Thank you. Lorenda Peck, RD, LDN, CDE Inpatient Diabetes Coordinator [phone]

## 2014-11-19 NOTE — Progress Notes (Signed)
[facility] PROGRESS NOTE  Marc [name]:[id number] DOB: 1960-11-03 DOA: 11/16/2014 PCP: No PCP Per Patient  brief narrative 54 year old male with history of hypertension, uncontrolled type 2 diabetes mellitus, tobacco and alcohol use noncompliant with medications and not following up as outpatient presented with abdominal pain and difficulty /burning urination with nausea. Patient was found to be in mild DKA with CVG 500, anion gap. Uterus is history of UTI with concern for pyelonephritis. Patient  septic with possible UTI/pyelonephritis.    Assessment/Plan: Sepsis secondary to UTI/pyelonephritis  On empiric Rocephin. This is growing mixed species. Continue IV hydration with normal saline.  Ultrasound abdomen shows mild bilateral hydronephrosis and distended bladder with significant debris. Patient afebrile but still has significant leukocytosis.  Acute tubular necrosis Both prerenal and obstructive uropathy. Has underlying diabetic nephropathy as well. Patient reports BPH symptoms. (Urgency, hesitancy and urgency incontinence) . Ultrasound abdomen with distended urinary bladder containing significant debris and bilateral mild hydronephrosis. Bladder scan done this morning showing 900 mL of urine. Foley placed in with significant diuresis and improvement in creatinine this morning.. Added Flomax. Voiding trial in am.   Uncontrolled type 2 diabetes mellitus with DKA DKA now resolved.A1c of 12.1. Increase Lantus and pre-meal aspart. Counseled on medication adherence and outpatient follow-up. Does not have a PCP and buying insulin as outpatient.[name] give contact information for the wellness Center to arrange outpatient follow-up.  I will give him prescription for his insulin, glucometer and diabetic supplies upon discharge  Uncontrolled hypertension Drug amlodipine and increased dose.  Hyponatremia Likely secondary to dehydration and alcohol abuse. Resolved with IV  hydration.  Hypokalemia Replenish   Alcohol abuse No signs of withdrawal. Monitor on CIWA. Continue thiamine, folate and multivitamin . Counseled on cessation   Tobacco abuse Counseled on cessation   GERD  . Added Maalox and PPI.  Anemia Possibly of chronic disease. Check iron panel  Diet: Diabetic DVT prophylaxis: Subcutaneous heparin    Code Status: Full code Family Communication: none at Bedside  Disposition Plan: Currently inpatient. Home possibly on 7/1or 7/2  if continues to improve  Consultants: None   Procedures:  US abdomen  Antibiotics: Rocephin since 6/27   HPI/Subjective: Patient seen and examined. Abdominal distention resolved after Foley placed in. No further pain or difficulty urinating.  Objective: Filed Vitals:   11/19/14 1300  BP: 135/67  Pulse: 107  Temp: 99 F (37.2 C)  Resp: 20    Intake/Output Summary (Last 24 hours) at 11/19/14 1447 Last data filed at 11/19/14 0900  Gross per 24 hour  Intake 1069.58 ml  Output   5200 ml  Net -4130.42 ml   Filed Weights   11/16/14 1744  Weight: 86.3 kg (190 lb 4.1 oz)    Exam:   General:  Middle aged  male in no acute distress  HEENT: No pallor, moist oral mucosa, supple neck  Chest: Clear to auscultation bilaterally, no added sounds  CVS: tachycardic, no murmurs rub or gallop  GI: Abdominal distention resolved,  nontender, bowel sounds present,   Musculoskeletal: Warm, no edema  CNS: Alert and oriented   Data Reviewed: Basic Metabolic Panel:  Recent Labs Lab 11/17/14 0121 11/17/14 0718 11/17/14 1315 11/18/14 0512 11/19/14 0340  NA 129* 127* 127* 128* 137  K 3.5 3.4* 3.4* 3.5 3.3*  CL 94* 91* 91* 94* 103  CO2 21* 26  GLUCOSE 170* 175* 254* 285* 224*  BUN 63* 67* 78* 91* 56*  CREATININE 2.02* 2.47* 2.69* 3.12*  1.50*  CALCIUM 7.8* 7.7* 8.1* 7.7* 7.6*   Liver Function Tests:  Recent Labs Lab 11/16/14 2010  AST 37  ALT 62  ALKPHOS 161*  BILITOT 0.9   PROT 7.4  ALBUMIN 2.7*    Recent Labs Lab 11/16/14 2048  LIPASE 34   No results for input(s): AMMONIA in the last 168 hours. CBC:  Recent Labs Lab 11/16/14 1453 11/17/14 0809 11/18/14 0512 11/19/14 0720  WBC 31.3* 27.5* 24.1* 24.3*  NEUTROABS 28.8*  --   --   --   HGB 11.5* 11.0* 10.9* 10.3*  HCT 34.3* 32.9* 31.8* 29.7*  MCV 80.7 79.5 78.7 77.7*  PLT 234 211 187 150   Cardiac Enzymes: No results for input(s): CKTOTAL, CKMB, CKMBINDEX, TROPONINI in the last 168 hours. BNP (last 3 results) No results for input(s): BNP in the last 8760 hours.  ProBNP (last 3 results) No results for input(s): PROBNP in the last 8760 hours.  CBG:  Recent Labs Lab 11/18/14 1143 11/18/14 1626 11/18/14 2225 11/19/14 0721 11/19/14 1114  GLUCAP 230* 192* 174* 191* 215*    Recent Results (from the past 240 hour(s))  Urine culture     Status: None   Collection Time: 11/16/14 12:11 PM  Result Value Ref Range Status   Specimen Description URINE, CLEAN CATCH  Final   Special Requests NONE  Final   Culture   Final    MULTIPLE SPECIES PRESENT, SUGGEST RECOLLECTION IF CLINICALLY INDICATED Performed at [name]    Report Status 11/18/2014 FINAL  Final     Studies: Dg Chest Port 1 View  11/18/2014   CLINICAL DATA:  Sepsis and weakness.  EXAM: PORTABLE CHEST - 1 VIEW  COMPARISON:  03/06/2014  FINDINGS: Subtle patchy densities at the right lung base. Heart size is normal. The trachea is midline. There is fullness in the mediastinum which could represent vascular congestion. Negative for a pneumothorax.  IMPRESSION: Patchy densities in the right lower lung with possible vascular congestion. Differential includes asymmetric pulmonary edema versus right lower lobe pneumonia/airspace disease. Recommend follow-up evaluation.   Electronically Signed   By: Richarda OverlieAdam  Henn M.D.   On: 11/18/2014 15:26    Scheduled Meds: . amLODipine  5 mg Oral Daily  . cefTRIAXone (ROCEPHIN)  IV  1 g  Intravenous Q24H  . folic acid  1 mg Oral Daily  . heparin subcutaneous  5,000 Units Subcutaneous 3 times per day  . insulin aspart  0-15 Units Subcutaneous TID WC  . insulin aspart  7 Units Subcutaneous TID WC  . insulin glargine  35 Units Subcutaneous QHS  . multivitamin with minerals  1 tablet Oral Daily  . pantoprazole  40 mg Oral Daily  . tamsulosin  0.4 mg Oral QPC supper  . thiamine  100 mg Oral Daily   Continuous Infusions: . sodium chloride 125 mL/hr at 11/19/14 0809       Time spent:25 minutes    Ambrie Carte  [facility] Pager [phone] If 7PM-7AM, please contact night-coverage at [email or web address], password [email or web address] 11/19/2014, 2:47 PM  LOS: 3 days

## 2014-11-20 DIAGNOSIS — N320 Bladder-neck obstruction: Secondary | ICD-10-CM | POA: Diagnosis present

## 2014-11-20 DIAGNOSIS — N390 Urinary tract infection, site not specified: Secondary | ICD-10-CM

## 2014-11-20 DIAGNOSIS — E1110 Type 2 diabetes mellitus with ketoacidosis without coma: Secondary | ICD-10-CM | POA: Diagnosis present

## 2014-11-20 DIAGNOSIS — A419 Sepsis, unspecified organism: Secondary | ICD-10-CM | POA: Diagnosis present

## 2014-11-20 DIAGNOSIS — E876 Hypokalemia: Secondary | ICD-10-CM

## 2014-11-20 DIAGNOSIS — N139 Obstructive and reflux uropathy, unspecified: Secondary | ICD-10-CM | POA: Diagnosis present

## 2014-11-20 LAB — CBC
HCT: 29.5 % — ABNORMAL LOW (ref 39.0–52.0)
Hemoglobin: 9.9 g/dL — ABNORMAL LOW (ref 13.0–17.0)
MCH: 26.5 pg (ref 26.0–34.0)
MCHC: 33.6 g/dL (ref 30.0–36.0)
MCV: 79.1 fL (ref 78.0–100.0)
PLATELETS: 155 10*3/uL (ref 150–400)
RBC: 3.73 MIL/uL — ABNORMAL LOW (ref 4.22–5.81)
RDW: 14.7 % (ref 11.5–15.5)
WBC: 26.1 10*3/uL — ABNORMAL HIGH (ref 4.0–10.5)

## 2014-11-20 LAB — PROCALCITONIN: PROCALCITONIN: 5.27 ng/mL

## 2014-11-20 LAB — BASIC METABOLIC PANEL
Anion gap: 9 (ref 5–15)
BUN: 25 mg/dL — ABNORMAL HIGH (ref 6–20)
CO2: 23 mmol/L (ref 22–32)
CREATININE: 1.05 mg/dL (ref 0.61–1.24)
Calcium: 7.3 mg/dL — ABNORMAL LOW (ref 8.9–10.3)
Chloride: 104 mmol/L (ref 101–111)
GFR calc Af Amer: >60 mL/min (ref >60)
GFR calc non Af Amer: >60 mL/min (ref >60)
Glucose, Bld: 188 mg/dL — ABNORMAL HIGH (ref 65–99)
Potassium: 3.3 mmol/L — ABNORMAL LOW (ref 3.5–5.1)
SODIUM: 136 mmol/L (ref 135–145)

## 2014-11-20 LAB — GLUCOSE, CAPILLARY
Glucose-Capillary: 165 mg/dL — ABNORMAL HIGH (ref 65–99)
Glucose-Capillary: 84 mg/dL (ref 65–99)
Glucose-Capillary: 163 mg/dL — ABNORMAL HIGH (ref 65–99)
Glucose-Capillary: 104 mg/dL — ABNORMAL HIGH (ref 65–99)

## 2014-11-20 LAB — IRON AND TIBC
Iron: 19 ug/dL — ABNORMAL LOW (ref 45–182)
Saturation Ratios: 13 % — ABNORMAL LOW (ref 17.9–39.5)
TIBC: 146 ug/dL — ABNORMAL LOW (ref 250–450)
UIBC: 127 ug/dL

## 2014-11-20 LAB — LACTIC ACID, PLASMA: Lactic Acid, Venous: 1.4 mmol/L (ref 0.5–2.0)

## 2014-11-20 MED ORDER — POTASSIUM CHLORIDE CRYS ER 20 MEQ PO TBCR
40.0000 meq | EXTENDED_RELEASE_TABLET | Freq: Once | ORAL | Status: AC
  Administered 2014-11-20: 40 meq via ORAL
  Filled 2014-11-20: qty 2

## 2014-11-20 MED ORDER — AMLODIPINE BESYLATE 10 MG PO TABS
10.0000 mg | ORAL_TABLET | Freq: Every day | ORAL | Status: DC
  Administered 2014-11-21 – 2014-11-22 (×2): 10 mg via ORAL
  Filled 2014-11-20 (×2): qty 1

## 2014-11-20 MED ORDER — LIVING WELL WITH DIABETES BOOK
Freq: Once | Status: AC
  Filled 2014-11-20: qty 1

## 2014-11-20 MED ORDER — ACETAMINOPHEN 325 MG PO TABS
650.0000 mg | ORAL_TABLET | Freq: Four times a day (QID) | ORAL | Status: DC | PRN
  Administered 2014-11-20 – 2014-11-22 (×3): 650 mg via ORAL
  Filled 2014-11-20 (×3): qty 2

## 2014-11-20 MED ORDER — INSULIN STARTER KIT- SYRINGES (ENGLISH)
1.0000 | Freq: Once | Status: AC
  Filled 2014-11-20: qty 1

## 2014-11-20 NOTE — Progress Notes (Signed)
Inpatient Diabetes Program Recommendations  AACE/ADA: New Consensus Statement on Inpatient Glycemic Control (2013)  Target Ranges:  Prepandial:   less than 140 mg/dL      Peak postprandial:   less than 180 mg/dL (1-2 hours)      Critically ill patients:  140 - 180 mg/dL   Reason for Visit: Elevated HgbA1C  Results for JESTON, JUNKINS (MRN [id number]) as of 11/20/2014 17:59  Ref. Range 11/20/2014 05:25  Sodium Latest Ref Range: 135-145 mmol/L 136  Potassium Latest Ref Range: 3.5-5.1 mmol/L 3.3 (L)  Chloride Latest Ref Range: 101-111 mmol/L 104  CO2 Latest Ref Range: 22-32 mmol/L 23  BUN Latest Ref Range: 6-20 mg/dL 25 (H)  Creatinine Latest Ref Range: 0.61-1.24 mg/dL 1.05  Calcium Latest Ref Range: 8.9-10.3 mg/dL 7.3 (L)  EGFR (Non-African Amer.) Latest Ref Range: >60 mL/min >60  EGFR (African American) Latest Ref Range: >60 mL/min >60  Glucose Latest Ref Range: 65-99 mg/dL 188 (H)  Anion gap Latest Ref Range: 5-15  9  Results for CARDELL, RACHEL (MRN [id number]) as of 11/20/2014 17:59  Ref. Range 11/16/2014 20:44  Hemoglobin A1C Latest Ref Range: 4.8-5.6 % 12.1 (H)   Results for ROSHUN, KLINGENSMITH (MRN [id number]) as of 11/20/2014 17:59  Ref. Range 11/19/2014 16:43 11/19/2014 22:04 11/20/2014 07:26 11/20/2014 11:43 11/20/2014 16:19  Glucose-Capillary Latest Ref Range: 65-99 mg/dL 154 (H) 166 (H) 165 (H) 84 163 (H)   Much improved blood sugars. Long discussion with pt regarding importance of controlling blood sugars at home. Discussed HgbA1C results and goals.Pt states he "really wasn't taking the metformin" at home. Spoke with pt about diet, monitoring blood sugars, hypoglycemia s/s and treatment, and need for PCP to manage his diabetes. Ordered insulin starter kit and RN to begin teaching insulin administration. Will need affordable insulin since pt does not have insurance and cannot get appt with [facility] with long holiday weekend. Per care manager pt has phone number and instructed to call  immediately after discharge.  Inpatient Diabetes Program Recommendations Insulin - Basal: 70/30 25 units bid HgbA1C: 12.1   Will need prescription for strips and lancets. Pt states he has meter at home. Will also need prescription for insulin generic ReliOn (at [facility] for $24.99) Novolin 70/30 along with insulin syringes.Order # [id number]  Discussed with RN this morning. Thank you. Lorenda Peck, RD, LDN, CDE Inpatient Diabetes Coordinator [phone]

## 2014-11-20 NOTE — Progress Notes (Signed)
[facility] PROGRESS NOTE  Marc [name] ZOX:[id number] DOB: 12-01-1960 DOA: 11/16/2014 PCP: No PCP Per Patient  brief narrative 54 year old male with history of hypertension, uncontrolled type 2 diabetes mellitus, tobacco and alcohol use noncompliant with medications and not following up as outpatient presented with abdominal pain and difficulty /burning urination with nausea. Patient was found to be in mild DKA with CVG 500, anion gap. Uterus is history of UTI with concern for pyelonephritis. Patient  septic with possible UTI/pyelonephritis.    Assessment/Plan: Sepsis secondary to UTI/pyelonephritis  On empiric Rocephin. cx growing mixed species. Blood cx not sent. Continue IV hydration with normal saline.  Ultrasound abdomen shows mild bilateral hydronephrosis and distended bladder with significant debris. Patient afebrile but still has significant leukocytosis. Reported of having loose stool . CXR from 6/29 showing possible RLL check for cdiff and latic acid.  persistent leucocytosis with possible rt LL pneumonia  will add azithromycin to treat for CAP.   Acute tubular necrosis Both prerenal and obstructive uropathy.  Patient reports BPH symptoms. (Urgency, hesitancy and urge incontinence) . Ultrasound abdomen with distended urinary bladder containing significant debris and bilateral mild hydronephrosis. Bladder scan done this morning showing 900 mL of urine. Foley placed in with significant diuresis .renal function improved to baseline.  Added Flomax. Attempt voiding trial today.    Uncontrolled type 2 diabetes mellitus with DKA DKA now resolved.A1c of 12.1. Increase Lantus and pre-meal aspart. Counseled on medication adherence and outpatient follow-up. Does not have a PCP and buying insulin as outpatient.[name] give contact information for the [facility] to arrange outpatient follow-up.  Pt will need  prescription for his insulin, ( humalog bid) glucometer and  diabetic supplies upon discharge. See diabetic coordinators recommendations.  Uncontrolled hypertension Added amlodipine  Hyponatremia Likely secondary to dehydration and alcohol abuse. Resolved with IV hydration.  Hypokalemia Replenish   Alcohol abuse No signs of withdrawal. Monitor on CIWA. Continue thiamine, folate and multivitamin . Counseled on cessation   Tobacco abuse Counseled on cessation. Ordered nicotine patch   GERD  .Added Maalox and PPI.  Anemia Possibly of chronic disease. Check iron panel  Diet: Diabetic  DVT prophylaxis: Subcutaneous heparin    Code Status: Full code Family Communication: none at Bedside   Disposition Plan: Currently inpatient. Hospital stay prolonged due to persistent leucocytosis. If improved and cdiff ruled can be discharged as early as next 24 hrs  Consultants: None   Procedures:  US abdomen  Antibiotics: Rocephin since 6/27   HPI/Subjective: Patient seen and examined. denies abdominal pain or distention. Afebrile. Reports loose BM yesterday  Objective: Filed Vitals:   11/20/14 0648  BP: 156/86  Pulse: 106  Temp: 98.7 F (37.1 C)  Resp: 20    Intake/Output Summary (Last 24 hours) at 11/20/14 1308 Last data filed at 11/20/14 8119  Gross per 24 hour  Intake 4961.25 ml  Output   4800 ml  Net 161.25 ml   Filed Weights   11/16/14 1744  Weight: 86.3 kg (190 lb 4.1 oz)    Exam:   General:  Middle aged  male in no acute distress  HEENT:  moist oral mucosa, supple neck  Chest: Clear to auscultation bilaterally, no added sounds  CVS: NS1&S2 no murmurs rub or gallop  GI: Abdominal distention resolved,  nontender, bowel sounds present, foley in place   Musculoskeletal: Warm, no edema  CNS: Alert and oriented   Data Reviewed: Basic Metabolic Panel:  Recent Labs Lab 11/17/14 0718 11/17/14 1315 11/18/14 1478  11/19/14 0340 11/20/14 0525  NA 127* 127* 128* 137 136  K 3.4* 3.4* 3.5 3.3* 3.3*  CL  91* 91* 94* 103 104  CO2 22 22 21* 26 23  GLUCOSE 175* 254* 285* 224* 188*  BUN 67* 78* 91* 56* 25*  CREATININE 2.47* 2.69* 3.12* 1.50* 1.05  CALCIUM 7.7* 8.1* 7.7* 7.6* 7.3*   Liver Function Tests:  Recent Labs Lab 11/16/14 2010  AST 37  ALT 62  ALKPHOS 161*  BILITOT 0.9  PROT 7.4  ALBUMIN 2.7*    Recent Labs Lab 11/16/14 2048  LIPASE 34   No results for input(s): AMMONIA in the last 168 hours. CBC:  Recent Labs Lab 11/16/14 1453 11/17/14 0809 11/18/14 0512 11/19/14 0720 11/20/14 0548  WBC 31.3* 27.5* 24.1* 24.3* 26.1*  NEUTROABS 28.8*  --   --   --   --   HGB 11.5* 11.0* 10.9* 10.3* 9.9*  HCT 34.3* 32.9* 31.8* 29.7* 29.5*  MCV 80.7 79.5 78.7 77.7* 79.1  PLT 234 211 187 150 155   Cardiac Enzymes: No results for input(s): CKTOTAL, CKMB, CKMBINDEX, TROPONINI in the last 168 hours. BNP (last 3 results) No results for input(s): BNP in the last 8760 hours.  ProBNP (last 3 results) No results for input(s): PROBNP in the last 8760 hours.  CBG:  Recent Labs Lab 11/19/14 1114 11/19/14 1643 11/19/14 2204 11/20/14 0726 11/20/14 1143  GLUCAP 215* 154* 166* 165* 84    Recent Results (from the past 240 hour(s))  Urine culture     Status: None   Collection Time: 11/16/14 12:11 PM  Result Value Ref Range Status   Specimen Description URINE, CLEAN CATCH  Final   Special Requests NONE  Final   Culture   Final    MULTIPLE SPECIES PRESENT, SUGGEST RECOLLECTION IF CLINICALLY INDICATED [facility]    Report Status 11/18/2014 FINAL  Final     Studies: Dg Chest Port 1 View  11/18/2014   CLINICAL DATA:  Sepsis and weakness.  EXAM: PORTABLE CHEST - 1 VIEW  COMPARISON:  03/06/2014  FINDINGS: Subtle patchy densities at the right lung base. Heart size is normal. The trachea is midline. There is fullness in the mediastinum which could represent vascular congestion. Negative for a pneumothorax.  IMPRESSION: Patchy densities in the right lower lung  with possible vascular congestion. Differential includes asymmetric pulmonary edema versus right lower lobe pneumonia/airspace disease. Recommend follow-up evaluation.   Electronically Signed   By: Richarda OverlieAdam  Henn M.D.   On: 11/18/2014 15:26    Scheduled Meds: . amLODipine  5 mg Oral Daily  . cefTRIAXone (ROCEPHIN)  IV  1 g Intravenous Q24H  . folic acid  1 mg Oral Daily  . glimepiride  2 mg Oral Q breakfast  . heparin subcutaneous  5,000 Units Subcutaneous 3 times per day  . insulin aspart  0-15 Units Subcutaneous TID WC  . insulin aspart  7 Units Subcutaneous TID WC  . insulin glargine  35 Units Subcutaneous QHS  . multivitamin with minerals  1 tablet Oral Daily  . pantoprazole  40 mg Oral Daily  . potassium chloride  40 mEq Oral Once  . tamsulosin  0.4 mg Oral QPC supper  . thiamine  100 mg Oral Daily   Continuous Infusions: . sodium chloride 125 mL/hr at 11/20/14 0820       Time spent:25 minutes    Ashrita Chrismer  [facility] Pager [phone] If 7PM-7AM, please contact night-coverage at [email or web address], password [email or web address]  11/20/2014, 1:08 PM  LOS: 4 days

## 2014-11-20 NOTE — Progress Notes (Signed)
Pt blood pressure 175/77, pulse 111.  10 Mg of labatelol given.  Will continue to monitor closely.

## 2014-11-20 NOTE — Progress Notes (Signed)
Foley catheter removed at 1211.  Pt unable to void, bladder scan preformed showing >999 cc's.  MD gave verbal order to place another foley catheter due to acute urinary retention. Foley placed; 1150 cc's of urine removed.  MD stated that pt would need to go home with foley and follow up with urology outpatient.  Will continue to monitor closely.

## 2014-11-20 NOTE — Progress Notes (Signed)
Educational video on diabetes shown to patient.

## 2014-11-21 ENCOUNTER — Inpatient Hospital Stay (HOSPITAL_COMMUNITY): Payer: Medicaid Other

## 2014-11-21 DIAGNOSIS — D638 Anemia in other chronic diseases classified elsewhere: Secondary | ICD-10-CM

## 2014-11-21 DIAGNOSIS — E1310 Other specified diabetes mellitus with ketoacidosis without coma: Secondary | ICD-10-CM

## 2014-11-21 DIAGNOSIS — D72829 Elevated white blood cell count, unspecified: Secondary | ICD-10-CM

## 2014-11-21 DIAGNOSIS — A419 Sepsis, unspecified organism: Principal | ICD-10-CM

## 2014-11-21 DIAGNOSIS — J189 Pneumonia, unspecified organism: Secondary | ICD-10-CM

## 2014-11-21 LAB — CBC
HCT: 29.9 % — ABNORMAL LOW (ref 39.0–52.0)
Hemoglobin: 10.2 g/dL — ABNORMAL LOW (ref 13.0–17.0)
MCH: 27 pg (ref 26.0–34.0)
MCHC: 34.1 g/dL (ref 30.0–36.0)
MCV: 79.1 fL (ref 78.0–100.0)
Platelets: 163 10*3/uL (ref 150–400)
RBC: 3.78 MIL/uL — ABNORMAL LOW (ref 4.22–5.81)
RDW: 14.9 % (ref 11.5–15.5)
WBC: 30.6 10*3/uL — AB (ref 4.0–10.5)

## 2014-11-21 LAB — GLUCOSE, CAPILLARY
GLUCOSE-CAPILLARY: 116 mg/dL — AB (ref 65–99)
Glucose-Capillary: 91 mg/dL (ref 65–99)
Glucose-Capillary: 145 mg/dL — ABNORMAL HIGH (ref 65–99)
Glucose-Capillary: 89 mg/dL (ref 65–99)

## 2014-11-21 LAB — STREP PNEUMONIAE URINARY ANTIGEN: STREP PNEUMO URINARY ANTIGEN: NEGATIVE

## 2014-11-21 LAB — LACTIC ACID, PLASMA
Lactic Acid, Venous: 1.5 mmol/L (ref 0.5–2.0)
Lactic Acid, Venous: 1.7 mmol/L (ref 0.5–2.0)

## 2014-11-21 LAB — PROCALCITONIN: Procalcitonin: 2.82 ng/mL

## 2014-11-21 MED ORDER — VANCOMYCIN HCL IN DEXTROSE 1-5 GM/200ML-% IV SOLN
1000.0000 mg | Freq: Two times a day (BID) | INTRAVENOUS | Status: DC
  Administered 2014-11-21: 1000 mg via INTRAVENOUS
  Filled 2014-11-21 (×2): qty 200

## 2014-11-21 MED ORDER — DEXTROSE 5 % IV SOLN: 500.0000 mg | INTRAVENOUS | Status: DC

## 2014-11-21 MED ORDER — PIPERACILLIN-TAZOBACTAM 3.375 G IVPB
3.3750 g | Freq: Three times a day (TID) | INTRAVENOUS | Status: DC
  Administered 2014-11-21 – 2014-11-22 (×2): 3.375 g via INTRAVENOUS
  Filled 2014-11-21 (×5): qty 50

## 2014-11-21 MED ORDER — VANCOMYCIN HCL 10 G IV SOLR
2000.0000 mg | Freq: Once | INTRAVENOUS | Status: AC
  Administered 2014-11-21: 2000 mg via INTRAVENOUS
  Filled 2014-11-21: qty 2000

## 2014-11-21 MED ORDER — PIPERACILLIN-TAZOBACTAM 3.375 G IVPB
3.3750 g | Freq: Once | INTRAVENOUS | Status: AC
  Administered 2014-11-21: 3.375 g via INTRAVENOUS
  Filled 2014-11-21: qty 50

## 2014-11-21 NOTE — Progress Notes (Signed)
Patient ID: Marc [name], male   DOB: [birth year], 54 y.o.   MRN: [id number] [facility] PROGRESS NOTE  Marc [name]:[id number] DOB: 23-Sep-1960 DOA: 11/16/2014 PCP: No PCP Per Patient - CM to help establish PCP in [facility]  Brief narrative:    54 year old male with history of hypertension, uncontrolled type 2 diabetes mellitus, tobacco and alcohol use, noncompliant with medications, does not have establish PCP. Pt presented to [facility] ED with abdominal pain, nausea, burring sensation on urination. He was found to be in mild DKA with CBG's in 500 range and elevated anion gap. He was also found to be septic on admission and was started on empiric rocephin for treatment of possible UTI/pyelonephritis. Renal US showed distended bladder with debris but no acute cystitis. CXR showed patchy densities in the right lower lung with possible vascular congestion. His hospital course is complicated with worsening leukocytosis, fever and concern for ongoing sepsis.  Barrier to discharge: Changed abx to broader coverage, vanco and zosyn, sepsis and pneumonia order set placed 11/21/2014. Will follow up on the results. If pt wants to go home he will have to sign AMA.   Assessment/Plan:    Principal Problem: Sepsis secondary to UTI / pyelonephritis / Right lower lobe pneumonia, unspecified organism / Leukocytosis  - Sepsis criteria met on admission and pt started on empiric rocephin for possible UTI and/or pyelonephritis. His UA had moderate leukocytes on admission but urine culture had multiple morphologies present and was not good specimen for analysis. - He continue to have persistent worsening leukocytosis and has spiked fever overnight to 100.8 F and has met sepsis criteria at least overnight. I have placed sepsis order set this am and will follow up on lactic acid, procalcitonin and blood culture results. Also, stool for C.diff ordered but not yet collected. - Pneumonia order set placed as well and we  will recheck CXR due to fever and to evaluate previously diagnosed possible pneumonia. - Abx changed to vanco and zosyn. Rocephin stopped 11/21/2014.  Active Problems: Acute tubular necrosis - Both prerenal and obstructive uropathy.  - Renal US showed distended bladded with debris and mild hydronephrosis - He says he feels better with foley catheter - Will continue to monitor his urine output - Creatinine normalized with fluids  - Continue Flomax daily   Uncontrolled type 2 diabetes mellitus with DKA - DKA resolved at this time - A1c was 12.1 on this admission indicating poor glycemic control likely from non compliance with insulin - Pt was counseled on adherence to insulin regimen - Current insulin regimen: Lantus 35 units at bedtime and novolog 7 units TID and SSI - Continue Amaryl  - CBG's in past 24 hours: 163, 104, 91.  Uncontrolled hypertension / hypertensive nephropathy  - Continue Norvasc daily   Hyponatremia - Likely secondary to dehydration and alcohol abuse.  - Resolved with IV fluids  Hypokalemia - Likely from insulin - Supplemented   Alcohol abuse - Alcohol level not checked on admission - On CIWA protocol - No reports of withdrawals   Tobacco abuse - Counseled on cessation.  - Ordered nicotine patch  GERD  - Continue Maalox and PPI.  Anemia of chronic disease  - Secondary to bone marrow suppression from alcohol abuse - No current indications for transfusion    DVT Prophylaxis  - Heparin subQ ordered    Code Status: Full.  Family Communication:  plan of care discussed with the patient Disposition Plan: not stable for discharge due to sepsis fever,  worsening leukocytosis   IV access:  Peripheral IV  Procedures and diagnostic studies:    Dg Chest Port 1 View 10-Dec-2014  Patchy densities in the right lower lung with possible vascular congestion. Differential includes asymmetric pulmonary edema versus right lower lobe pneumonia/airspace disease.  Recommend follow-up evaluation.     US Renal 11/17/2014    Distended urinary bladder containing significant debris.  Mild BILATERAL renal collecting system dilatation though uncertain if this could be related to bladder distention.      Medical Consultants:  None   Other Consultants:  Diabetic coordinator  IAnti-Infectives:   Rocephin  11/16/14 --> 11/21/2014  Vanco 11/21/2014 --> Zosyn 11/21/2014 -->    DEVINE, ALMA, MD  [facility] Pager [phone]  Time spent in minutes: 25 minutes  If 7PM-7AM, please contact night-coverage [email or web address] Password [email or web address] 11/21/2014, 10:13 AM   LOS: 5 days    HPI/Subjective: No acute overnight events. Patient reports feeling better. Has good urine output.   Objective: Filed Vitals:   11/20/14 1700 11/20/14 2126 11/20/14 2315 11/21/14 0448  BP: 159/78 135/69  150/73  Pulse: 116 116  100  Temp: 99.7 F (37.6 C) 100.8 F (38.2 C) 99 F (37.2 C) 98.3 F (36.8 C)  TempSrc: Oral Oral  Oral  Resp: 24 22  20   Height:      Weight:      SpO2: 95% 100%  90%    Intake/Output Summary (Last 24 hours) at 11/21/14 1013 Last data filed at 11/21/14 0830  Gross per 24 hour  Intake 3792.92 ml  Output   5649 ml  Net -1856.08 ml    Exam:   General:  Pt is alert, follows commands appropriately, not in acute distress  Cardiovascular: Regular rate and rhythm, S1/S2 (+)  Respiratory: Clear to auscultation bilaterally, no wheezing, no crackles, no rhonchi  Abdomen: Soft, non tender, non distended, bowel sounds present  Extremities: No edema, pulses DP and PT palpable bilaterally  Neuro: Grossly nonfocal  Data Reviewed: Basic Metabolic Panel:  Recent Labs Lab 11/17/14 0718 11/17/14 1315 12/10/2014 0512 11/19/14 0340 11/20/14 0525  NA 127* 127* 128* 137 136  K 3.4* 3.4* 3.5 3.3* 3.3*  CL 91* 91* 94* 103 104  CO2 22 22 21* 26 23  GLUCOSE 175* 254* 285* 224* 188*  BUN 67* 78* 91* 56* 25*  CREATININE 2.47* 2.69* 3.12* 1.50* 1.05   CALCIUM 7.7* 8.1* 7.7* 7.6* 7.3*   Liver Function Tests:  Recent Labs Lab 11/16/14 2010  AST 37  ALT 62  ALKPHOS 161*  BILITOT 0.9  PROT 7.4  ALBUMIN 2.7*    Recent Labs Lab 11/16/14 2048  LIPASE 34   No results for input(s): AMMONIA in the last 168 hours. CBC:  Recent Labs Lab 11/16/14 1453 11/17/14 0809 2014/12/10 0512 11/19/14 0720 11/20/14 0548 11/21/14 0510  WBC 31.3* 27.5* 24.1* 24.3* 26.1* 30.6*  NEUTROABS 28.8*  --   --   --   --   --   HGB 11.5* 11.0* 10.9* 10.3* 9.9* 10.2*  HCT 34.3* 32.9* 31.8* 29.7* 29.5* 29.9*  MCV 80.7 79.5 78.7 77.7* 79.1 79.1  PLT 234 211 187 150 155 163   Cardiac Enzymes: No results for input(s): CKTOTAL, CKMB, CKMBINDEX, TROPONINI in the last 168 hours. BNP: Invalid input(s): POCBNP CBG:  Recent Labs Lab 11/20/14 0726 11/20/14 1143 11/20/14 1619 11/20/14 2124 11/21/14 0723  GLUCAP 165* 84 163* 104* 91    Recent Results (from the past 240 hour(s))  Urine culture  Status: None   Collection Time: 11/16/14 12:11 PM  Result Value Ref Range Status   Specimen Description URINE, CLEAN CATCH  Final   Special Requests NONE  Final   Culture   Final    MULTIPLE SPECIES PRESENT, SUGGEST RECOLLECTION IF CLINICALLY INDICATED Performed at [facility]    Report Status 11/18/2014 FINAL  Final     Scheduled Meds: . amLODipine  10 mg Oral Daily  . cefTRIAXone (ROCEPHIN)  IV  1 g Intravenous Q24H  . folic acid  1 mg Oral Daily  . glimepiride  2 mg Oral Q breakfast  . heparin subcutaneous  5,000 Units Subcutaneous 3 times per day  . insulin aspart  0-15 Units Subcutaneous TID WC  . insulin aspart  7 Units Subcutaneous TID WC  . insulin glargine  35 Units Subcutaneous QHS  . insulin starter kit- syringes  1 kit Other Once  . multivitamin with minerals  1 tablet Oral Daily  . pantoprazole  40 mg Oral Daily  . tamsulosin  0.4 mg Oral QPC supper  . thiamine  100 mg Oral Daily   Continuous Infusions: . sodium  chloride 125 mL/hr at 11/21/14 0142

## 2014-11-21 NOTE — Evaluation (Signed)
Physical Therapy Evaluation Patient Details Name: Marc [name] MRN: [id number] DOB: 11-06-1960 Today's Date: 11/21/2014   History of Present Illness  54 year old male with history of hypertension, uncontrolled type 2 diabetes mellitus, tobacco and alcohol use, noncompliant with medications, d [facility]  with abdominal pain, nausea, burring sensation on urination. He was found to be in mild DKA with CBG's in 500 range and elevated anion gap. He was also found to be septic on admission and was started on antibiotics for treatment of possible UTI/pyelonephritis. Renal US showed distended bladder with debris but no acute cystitis. CXR showed patchy densities in the right lower lung with possible vascular congestion.  Clinical Impression  Patient ambulated with RW x 152', slow speed. Patient will benefit from PT to address problems listed in note below.    Follow Up Recommendations No PT follow up    Equipment Recommendations   (tba)    Recommendations for Other Services       Precautions / Restrictions Precautions Precautions: Fall      Mobility  Bed Mobility Overal bed mobility: Needs Assistance Bed Mobility: Supine to Sit     Supine to sit: Supervision     General bed mobility comments: extra tome to move to edge  Transfers Overall transfer level: Needs assistance Equipment used: Rolling walker (2 wheeled) Transfers: Sit to/from Stand Sit to Stand: Min guard         General transfer comment: extra time.  Ambulation/Gait Ambulation/Gait assistance: Min guard Ambulation Distance (Feet): 152 Feet Assistive device: Rolling walker (2 wheeled) Gait Pattern/deviations: Step-through pattern;Decreased stride length     General Gait Details: cues  for posture, use of RW  Stairs            Wheelchair Mobility    Modified Rankin (Stroke Patients Only)       Balance                                              Pertinent Vitals/Pain Pain Assessment: No/denies pain    Home Living Family/patient expects to be discharged to:: Private residence Living Arrangements: Non-relatives/Friends Available Help at Discharge: Friend(s) Type of Home: House Home Access: Level entry       Home Equipment: None      Prior Function Level of Independence: Independent               Hand Dominance        Extremity/Trunk Assessment   Upper Extremity Assessment: Generalized weakness           Lower Extremity Assessment: Generalized weakness         Communication   Communication: No difficulties  Cognition Arousal/Alertness: Awake/alert Behavior During Therapy: WFL for tasks assessed/performed Overall Cognitive Status: No family/caregiver present to determine baseline cognitive functioning Area of Impairment: Awareness               General Comments: slow to process    General Comments      Exercises        Assessment/Plan    PT Assessment    PT Diagnosis Difficulty walking;Generalized weakness   PT Problem List    PT Treatment Interventions     PT Goals (Current goals can be found in the Care Plan section) Acute Rehab PT Goals Patient Stated Goal: to go home PT Goal Formulation: With  patient Time For Goal Achievement: 12/05/14 Potential to Achieve Goals: Good    Frequency     Barriers to discharge        Co-evaluation               End of Session   Activity Tolerance: Patient tolerated treatment well Patient left: in bed;with call bell/phone within reach;with bed alarm set Nurse Communication: Mobility status         Time: 4098-1191 PT Time Calculation (min) (ACUTE ONLY): 17 min   Charges:   PT Evaluation $Initial PT Evaluation Tier I: 1 Procedure     PT G CodesRada Hay 11/21/2014, 12:50 PM  Blanchard Kelch PT [phone]

## 2014-11-21 NOTE — Progress Notes (Signed)
Pt blood pressure 176/81 with pulse sustaining in the 120's.  10 mg labatelol given.  BP came down to 150/78 and HR sustaining in the low 100's.  Will continue to monitor closely.

## 2014-11-21 NOTE — Progress Notes (Signed)
Pt am blood glucose 91.  7 units of meal coverage held, MD aware.

## 2014-11-21 NOTE — Progress Notes (Signed)
ANTIBIOTIC CONSULT NOTE - INITIAL  Pharmacy Consult for Vancomycin & Zosyn Indication: rule out sepsis, pyelonephritis, PNA  No Known Allergies  Patient Measurements: Height: 5' 11"  (180.3 cm) Weight: 190 lb 4.1 oz (86.3 kg) IBW/kg (Calculated) : 75.3  Vital Signs: Temp: 98.3 F (36.8 C) (07/02 0448) Temp Source: Oral (07/02 0448) BP: 150/73 mmHg (07/02 0448) Pulse Rate: 100 (07/02 0448) Intake/Output from previous day: 07/01 0701 - 07/02 0700 In: 3792.9 [P.O.:720; I.V.:2972.9; IV Piggyback:100] Out: 5649 [Urine:5649] Intake/Output from this shift: Total I/O In: 240 [P.O.:240] Out: -   Labs:  Recent Labs  11/18/14 1040 11/19/14 0340 11/19/14 0720 11/20/14 0525 11/20/14 0548 11/21/14 0510  WBC  --   --  24.3*  --  26.1* 30.6*  HGB  --   --  10.3*  --  9.9* 10.2*  PLT  --   --  150  --  155 163  LABCREA 68.33  --   --   --   --   --   CREATININE  --  1.50*  --  1.05  --   --    Estimated Creatinine Clearance: 87.7 mL/min (by C-G formula based on Cr of 1.05). No results for input(s): VANCOTROUGH, VANCOPEAK, VANCORANDOM, GENTTROUGH, GENTPEAK, GENTRANDOM, TOBRATROUGH, TOBRAPEAK, TOBRARND, AMIKACINPEAK, AMIKACINTROU, AMIKACIN in the last 72 hours.   Microbiology: Recent Results (from the past 720 hour(s))  Urine culture     Status: None   Collection Time: 11/16/14 12:11 PM  Result Value Ref Range Status   Specimen Description URINE, CLEAN CATCH  Final   Special Requests NONE  Final   Culture   Final    MULTIPLE SPECIES PRESENT, SUGGEST RECOLLECTION IF CLINICALLY INDICATED Performed at [facility]    Report Status 11/18/2014 FINAL  Final   Medical History: Past Medical History  Diagnosis Date  . Diabetes mellitus without complication   . Hypertension    Medications:  Scheduled:  . amLODipine  10 mg Oral Daily  . folic acid  1 mg Oral Daily  . glimepiride  2 mg Oral Q breakfast  . heparin subcutaneous  5,000 Units Subcutaneous 3 times per day  .  insulin aspart  0-15 Units Subcutaneous TID WC  . insulin aspart  7 Units Subcutaneous TID WC  . insulin glargine  35 Units Subcutaneous QHS  . insulin starter kit- syringes  1 kit Other Once  . multivitamin with minerals  1 tablet Oral Daily  . pantoprazole  40 mg Oral Daily  . tamsulosin  0.4 mg Oral QPC supper  . thiamine  100 mg Oral Daily   Anti-infectives    Start     Dose/Rate Route Frequency Ordered Stop   11/21/14 1015  azithromycin (ZITHROMAX) 500 mg in dextrose 5 % 250 mL IVPB  Status:  Discontinued     500 mg 250 mL/hr over 60 Minutes Intravenous Every 24 hours 11/21/14 1013 11/21/14 1018   11/17/14 0600  cefTRIAXone (ROCEPHIN) 1 g in dextrose 5 % 50 mL IVPB - Premix  Status:  Discontinued     1 g 100 mL/hr over 30 Minutes Intravenous Every 24 hours 11/16/14 1918 11/21/14 1018   11/16/14 1415  cefTRIAXone (ROCEPHIN) 1 g in dextrose 5 % 50 mL IVPB     1 g 100 mL/hr over 30 Minutes Intravenous  Once 11/16/14 1407 11/16/14 1500     Assessment: 72 yoM admitted d6/27 with abdominal pain, burning on urination, nausea. Hx of DM 2 with CBG's > 500 on  admit, started on Rocephin for probable UTI. Urine cx on admit: multiple species. Continues low grade temps, increasing leukocytosis, new effusion on CXray. Begin Vancomycin & Zosyn for sepsis, PNA, pyelonephritis.  Goal of Therapy:  Vancomycin trough level 15-20 mcg/ml  Plan:   Vancomycin 2gm x1, then 1gm q12  Zosyn 3.375gm q8hr, 4 hr infusion  Monitor renal function  Minda Ditto PharmD Pager [phone] 11/21/2014, 11:16 AM

## 2014-11-21 NOTE — Plan of Care (Signed)
Problem: Phase I Progression Outcomes Goal: Other Phase I Outcomes/Goals Outcome: Progressing RN did teach back with the patient r/t Insulin injections and emptying the foley catheter. Patient is still progressing with these teaching points.

## 2014-11-22 DIAGNOSIS — N179 Acute kidney failure, unspecified: Secondary | ICD-10-CM

## 2014-11-22 DIAGNOSIS — D72829 Elevated white blood cell count, unspecified: Secondary | ICD-10-CM | POA: Insufficient documentation

## 2014-11-22 DIAGNOSIS — I10 Essential (primary) hypertension: Secondary | ICD-10-CM

## 2014-11-22 LAB — CBC
HCT: 30 % — ABNORMAL LOW (ref 39.0–52.0)
HEMOGLOBIN: 9.9 g/dL — AB (ref 13.0–17.0)
MCH: 26.4 pg (ref 26.0–34.0)
MCHC: 33 g/dL (ref 30.0–36.0)
MCV: 80 fL (ref 78.0–100.0)
Platelets: 196 10*3/uL (ref 150–400)
RBC: 3.75 MIL/uL — ABNORMAL LOW (ref 4.22–5.81)
RDW: 15 % (ref 11.5–15.5)
WBC: 25.3 10*3/uL — ABNORMAL HIGH (ref 4.0–10.5)

## 2014-11-22 LAB — BASIC METABOLIC PANEL
Anion gap: 8 (ref 5–15)
BUN: 14 mg/dL (ref 6–20)
CO2: 27 mmol/L (ref 22–32)
Calcium: 7.4 mg/dL — ABNORMAL LOW (ref 8.9–10.3)
Chloride: 103 mmol/L (ref 101–111)
Creatinine, Ser: 0.93 mg/dL (ref 0.61–1.24)
GFR calc Af Amer: >60 mL/min (ref >60)
GFR calc non Af Amer: >60 mL/min (ref >60)
Glucose, Bld: 62 mg/dL — ABNORMAL LOW (ref 65–99)
POTASSIUM: 3.2 mmol/L — AB (ref 3.5–5.1)
Sodium: 138 mmol/L (ref 135–145)

## 2014-11-22 LAB — HIV ANTIBODY (ROUTINE TESTING W REFLEX): HIV SCREEN 4TH GENERATION: NONREACTIVE

## 2014-11-22 LAB — GLUCOSE, CAPILLARY
Glucose-Capillary: 54 mg/dL — ABNORMAL LOW (ref 65–99)
Glucose-Capillary: 110 mg/dL — ABNORMAL HIGH (ref 65–99)

## 2014-11-22 MED ORDER — INSULIN ASPART 100 UNIT/ML ~~LOC~~ SOLN: 7.0000 [IU] | Freq: Three times a day (TID) | SUBCUTANEOUS | Status: DC

## 2014-11-22 MED ORDER — ALUM & MAG HYDROXIDE-SIMETH 200-200-20 MG/5ML PO SUSP: 30.0000 mL | Freq: Four times a day (QID) | ORAL | Status: DC | PRN

## 2014-11-22 MED ORDER — TAMSULOSIN HCL 0.4 MG PO CAPS: 0.4000 mg | ORAL_CAPSULE | Freq: Every day | ORAL | Status: DC

## 2014-11-22 MED ORDER — ADULT MULTIVITAMIN W/MINERALS CH: 1.0000 | ORAL_TABLET | Freq: Every day | ORAL | Status: DC

## 2014-11-22 MED ORDER — AMLODIPINE BESYLATE 10 MG PO TABS: 10.0000 mg | ORAL_TABLET | Freq: Every day | ORAL | Status: DC

## 2014-11-22 MED ORDER — POTASSIUM CHLORIDE CRYS ER 20 MEQ PO TBCR
40.0000 meq | EXTENDED_RELEASE_TABLET | Freq: Once | ORAL | Status: AC
  Administered 2014-11-22: 40 meq via ORAL
  Filled 2014-11-22: qty 2

## 2014-11-22 MED ORDER — LEVOFLOXACIN 500 MG PO TABS: 500.0000 mg | ORAL_TABLET | Freq: Every day | ORAL | Status: DC

## 2014-11-22 MED ORDER — INSULIN GLARGINE 100 UNIT/ML ~~LOC~~ SOLN: 35.0000 [IU] | Freq: Every day | SUBCUTANEOUS | Status: DC

## 2014-11-22 MED ORDER — THIAMINE HCL 100 MG PO TABS: 100.0000 mg | ORAL_TABLET | Freq: Every day | ORAL | Status: DC

## 2014-11-22 MED ORDER — ACCU-CHEK SOFT TOUCH LANCETS MISC: Status: DC

## 2014-11-22 MED ORDER — GLUCOSE BLOOD VI STRP: ORAL_STRIP | Status: DC

## 2014-11-22 MED ORDER — GLIMEPIRIDE 2 MG PO TABS: 2.0000 mg | ORAL_TABLET | Freq: Every day | ORAL | Status: DC

## 2014-11-22 MED ORDER — POTASSIUM CHLORIDE CRYS ER 20 MEQ PO TBCR: 20.0000 meq | EXTENDED_RELEASE_TABLET | Freq: Every day | ORAL | Status: DC

## 2014-11-22 MED ORDER — PANTOPRAZOLE SODIUM 40 MG PO TBEC: 40.0000 mg | DELAYED_RELEASE_TABLET | Freq: Every day | ORAL | Status: DC

## 2014-11-22 MED ORDER — FOLIC ACID 1 MG PO TABS: 1.0000 mg | ORAL_TABLET | Freq: Every day | ORAL | Status: DC

## 2014-11-22 NOTE — Discharge Instructions (Signed)

## 2014-11-22 NOTE — Discharge Summary (Signed)
Physician Discharge Summary  Marc [name] HER:[id number] DOB: Mar 09, 1961 DOA: 11/16/2014  PCP: No PCP Per Patient - patient will need to follow in community health wellness clinic. Since today it is Sunday we cannot schedule appointment for him so he will do this for himself. Patient needs to continue with community health wellness clinic until he establishes primary care physician. He is agreeable to make an appointment for himself in community health wellness clinic.  Admit date: 11/16/2014 Discharge date: 11/22/2014  Recommendations for Outpatient Follow-up:  1. Take potassium supplementation for 5 days on discharge 2. Take Levaquin for 7 days on discharge 3. Please follow-up with primary care physician in about one week from discharge to repeat CBC, specifically make sure that white blood cell count is improving. Current white blood cell count is 25.3 and better since the admission value of 30.  Discharge Diagnoses:  Principal Problem:   Sepsis Active Problems:   Hyperglycemia   Hyperglycemia due to type 2 diabetes mellitus   Acute renal failure   Hyponatremia   Hypokalemia   Essential hypertension   Bladder outlet obstruction   Obstructive uropathy   DKA, type 2, not at goal    Discharge Condition: stable; blood cultures and Legionella are still pending. Patient insists on going home today.  Diet recommendation: as tolerated   History of present illness:   54 year old male with history of hypertension, uncontrolled type 2 diabetes mellitus, tobacco and alcohol use, noncompliant with medications, does not have establish PCP. Pt presented to [facility] ED with abdominal pain, nausea, burring sensation on urination. He was found to be in mild DKA with CBG's in 500 range and elevated anion gap. He was also found to be septic on admission and was started on empiric rocephin for treatment of possible UTI/pyelonephritis. Renal US showed distended bladder with debris but no acute cystitis.  CXR showed patchy densities in the right lower lung with possible vascular congestion. His hospital course is complicated with worsening leukocytosis, fever and concern for sepsis.  This morning, patient looks better. He is afebrile in past 24 hours. He insists on going home today.  Hospital Course:    Assessment/Plan:    Principal Problem: Sepsis secondary to UTI / pyelonephritis / Right lower lobe pneumonia, unspecified organism / Leukocytosis  - Sepsis criteria met on admission and pt started on empiric rocephin for possible UTI and/or pyelonephritis. His UA had moderate leukocytes on admission but urine culture had multiple morphologies present and was not good specimen for analysis. - He had a fever on the night of 11/20/2014 - 11/21/2014, T max 100.60F. Sepsis workup initiated. We have obtained blood cultures, chest x-ray and changed antibiotics to cover for broad-spectrum, vancomycin and Zosyn instead of Rocephin. His lactic acid was within normal limits but procalcitonin was slightly elevated at 2.82. - He remains afebrile in past 24 hours. - His current blood cultures are still pending. He insists on going home today so will have to follow-up on outpatient basis. Legionella is pending and strep pneumoniae is negative. - Chest x-ray on 11/21/2014 showed more confluent disease in the left upper lung and the right lung base which could represent edema versus multifocal airspace disease. - We will prescribe Levaquin for 7 days on discharge.  Active Problems: Acute tubular necrosis - Both prerenal and obstructive uropathy.  - Renal US showed distended bladded with debris and mild hydronephrosis - He will follow up on outpatient basis to remove Foley catheter. - Creatinine normalized with fluids  -  Continue Flomax daily   Uncontrolled type 2 diabetes mellitus with DKA - DKA resolved at this time - A1c was 12.1 on this admission indicating poor glycemic control likely from non  compliance with insulin - Pt was counseled on adherence to insulin regimen - Current insulin regimen: Lantus 35 units at bedtime and novolog 7 units TID - Continue Amaryl   Uncontrolled hypertension / hypertensive nephropathy  - Continue Norvasc daily   Hyponatremia - Likely secondary to dehydration and alcohol abuse.  - Resolved with IV fluids  Hypokalemia - Likely from insulin - Supplemented   Alcohol abuse - Alcohol level not checked on admission - Pt was on CIWA protocol - No reports of withdrawals   Tobacco abuse - Counseled on cessation.  - Ordered nicotine patch  GERD  - Continue Maalox and PPI.  Anemia of chronic disease  - Secondary to bone marrow suppression from alcohol abuse - No current indications for transfusion    DVT Prophylaxis  - Heparin subQ ordered while pt in hospital    Code Status: Full.  Family Communication: plan of care discussed with the patient   IV access:  Peripheral IV  Procedures and diagnostic studies:   Dg Chest Port 1 View 11/18/2014 Patchy densities in the right lower lung with possible vascular congestion. Differential includes asymmetric pulmonary edema versus right lower lobe pneumonia/airspace disease. Recommend follow-up evaluation.   US Renal 11/17/2014 Distended urinary bladder containing significant debris. Mild BILATERAL renal collecting system dilatation though uncertain if this could be related to bladder distention.   Medical Consultants:  None   Other Consultants:  Diabetic coordinator  IAnti-Infectives:   Rocephin 11/16/14 --> 11/21/2014  Vanco 11/21/2014 --> 11/22/2014  Zosyn 11/21/2014 --> 11/22/2014    Signed:  Leisa Lenz, MD  [facility] 11/22/2014, 9:30 AM  Pager #: [phone]  Time spent in minutes: more than 30 minutes   Discharge Exam: Filed Vitals:   11/22/14 0626  BP: 155/71  Pulse: 95  Temp:   Resp:    Filed Vitals:   11/21/14 2205 11/21/14 2306  11/22/14 0454 11/22/14 0626  BP: 155/70  172/70 155/71  Pulse: 105  108 95  Temp: 100 F (37.8 C) 99.4 F (37.4 C) 98 F (36.7 C)   TempSrc: Oral Oral Oral   Resp: 18  20   Height:      Weight:      SpO2: 90%  95%     General: Pt is alert, follows commands appropriately, not in acute distress Cardiovascular: Regular rate and rhythm, S1/S2 +, no murmurs Respiratory: Clear to auscultation bilaterally, no wheezing, no crackles, no rhonchi Abdominal: Soft, non tender, non distended, bowel sounds +, no guarding Extremities: no edema, no cyanosis, pulses palpable bilaterally DP and PT Neuro: Grossly nonfocal  Discharge Instructions  Discharge Instructions    Call MD for:  difficulty breathing, headache or visual disturbances    Complete by:  As directed      Call MD for:  persistant dizziness or light-headedness    Complete by:  As directed      Call MD for:  persistant nausea and vomiting    Complete by:  As directed      Call MD for:  severe uncontrolled pain    Complete by:  As directed      Diet - low sodium heart healthy    Complete by:  As directed      Discharge instructions    Complete by:  As directed  1. Take potassium supplementation for 5 days on discharge 2. Take Levaquin for 7 days on discharge 3. Please follow-up with primary care physician in about one week from discharge to repeat CBC, specifically make sure that white blood cell count is improving. Current white blood cell count is 25.3 and better since the admission value of 30.     Increase activity slowly    Complete by:  As directed             Medication List    STOP taking these medications        aspirin 81 MG chewable tablet     cephALEXin 500 MG capsule  Commonly known as:  KEFLEX     hydrochlorothiazide 25 MG tablet  Commonly known as:  HYDRODIURIL     levETIRAcetam 500 MG tablet  Commonly known as:  KEPPRA     lisinopril 5 MG tablet  Commonly known as:  PRINIVIL,ZESTRIL      metFORMIN 500 MG tablet  Commonly known as:  GLUCOPHAGE     sulfamethoxazole-trimethoprim 800-160 MG per tablet  Commonly known as:  BACTRIM DS,SEPTRA DS     traMADol 50 MG tablet  Commonly known as:  ULTRAM      TAKE these medications        accu-chek soft touch lancets  Use as instructed     acetaminophen 500 MG tablet  Commonly known as:  TYLENOL  Take 500 mg by mouth every 6 (six) hours as needed for mild pain.     alum & mag hydroxide-simeth 200-200-20 MG/5ML suspension  Commonly known as:  MAALOX/MYLANTA  Take 30 mLs by mouth every 6 (six) hours as needed for indigestion or heartburn.     amLODipine 10 MG tablet  Commonly known as:  NORVASC  Take 1 tablet (10 mg total) by mouth daily.     folic acid 1 MG tablet  Commonly known as:  FOLVITE  Take 1 tablet (1 mg total) by mouth daily.     glimepiride 2 MG tablet  Commonly known as:  AMARYL  Take 1 tablet (2 mg total) by mouth daily with breakfast.     glucose blood test strip  Use as instructed     insulin aspart 100 UNIT/ML injection  Commonly known as:  novoLOG  Inject 7 Units into the skin 3 (three) times daily with meals.     insulin glargine 100 UNIT/ML injection  Commonly known as:  LANTUS  Inject 0.35 mLs (35 Units total) into the skin at bedtime.     levofloxacin 500 MG tablet  Commonly known as:  LEVAQUIN  Take 1 tablet (500 mg total) by mouth daily.     multivitamin with minerals Tabs tablet  Take 1 tablet by mouth daily.     pantoprazole 40 MG tablet  Commonly known as:  PROTONIX  Take 1 tablet (40 mg total) by mouth daily.     potassium chloride SA 20 MEQ tablet  Commonly known as:  K-DUR,KLOR-CON  Take 1 tablet (20 mEq total) by mouth daily.     tamsulosin 0.4 MG Caps capsule  Commonly known as:  FLOMAX  Take 1 capsule (0.4 mg total) by mouth daily after supper.     thiamine 100 MG tablet  Take 1 tablet (100 mg total) by mouth daily.           Follow-up Information    Follow up  with [facility].   Why:  Please  call for an appointment. [phone]   Contact information:   [address] [place] [address] [phone]       The results of significant diagnostics from this hospitalization (including imaging, microbiology, ancillary and laboratory) are listed below for reference.    Significant Diagnostic Studies: US Renal  11/17/2014   CLINICAL DATA:  Acute renal failure, history hypertension, diabetes  EXAM: RENAL / URINARY TRACT ULTRASOUND COMPLETE  COMPARISON:  None  FINDINGS: Right Kidney:  Length: 11.4 cm. Upper normal cortical echogenicity. Normal cortical thickness. Mild collecting system dilatation. No renal mass or shadowing calcification.  Left Kidney:  Length: 13.0 cm. Normal cortical thickness with slight fetal lobulation noted. Slightly increased cortical echogenicity. Mild collecting system dilatation. No mass or shadowing calcification.  Bladder:  Distended, containing significant debris.  No gross/discrete mass.  IMPRESSION: Distended urinary bladder containing significant debris.  Mild BILATERAL renal collecting system dilatation though uncertain if this could be related to bladder distention.   Electronically Signed   By: Lavonia Dana M.D.   On: 11/17/2014 10:11   Dg Chest Port 1 View  11/21/2014   CLINICAL DATA:  Sepsis.  EXAM: PORTABLE CHEST - 1 VIEW  COMPARISON:  11/18/2014  FINDINGS: Heart size remains within normal limits. Prominent lung markings could represent edema. Again noted are patchy densities at the right lung base. Increased densities at the left lung base are concerning for pleural fluid and airspace disease. Concern for subtle airspace disease in left upper lung.  IMPRESSION: Increased densities at the left lung base likely represent pleural fluid and airspace disease.  Prominent lung markings with more confluent disease in the left upper lung and right lung base. Findings could represent  asymmetric edema versus multi focal airspace disease.   Electronically Signed   By: Markus Daft M.D.   On: 11/21/2014 10:33   Dg Chest Port 1 View  11/18/2014   CLINICAL DATA:  Sepsis and weakness.  EXAM: PORTABLE CHEST - 1 VIEW  COMPARISON:  03/06/2014  FINDINGS: Subtle patchy densities at the right lung base. Heart size is normal. The trachea is midline. There is fullness in the mediastinum which could represent vascular congestion. Negative for a pneumothorax.  IMPRESSION: Patchy densities in the right lower lung with possible vascular congestion. Differential includes asymmetric pulmonary edema versus right lower lobe pneumonia/airspace disease. Recommend follow-up evaluation.   Electronically Signed   By: Markus Daft M.D.   On: 11/18/2014 15:26    Microbiology: Recent Results (from the past 240 hour(s))  Urine culture     Status: None   Collection Time: 11/16/14 12:11 PM  Result Value Ref Range Status   Specimen Description URINE, CLEAN CATCH  Final   Special Requests NONE  Final   Culture   Final    MULTIPLE SPECIES PRESENT, SUGGEST RECOLLECTION IF CLINICALLY INDICATED Performed at [facility]    Report Status 11/18/2014 FINAL  Final     Labs: Basic Metabolic Panel:  Recent Labs Lab 11/17/14 1315 11/18/14 0512 11/19/14 0340 11/20/14 0525 11/22/14 0551  NA 127* 128* 137 136 138  K 3.4* 3.5 3.3* 3.3* 3.2*  CL 91* 94* 103 104 103  CO2 22 21* 26 23 27   GLUCOSE 254* 285* 224* 188* 62*  BUN 78* 91* 56* 25* 14  CREATININE 2.69* 3.12* 1.50* 1.05 0.93  CALCIUM 8.1* 7.7* 7.6* 7.3* 7.4*   Liver Function Tests:  Recent Labs Lab 11/16/14 2010  AST 37  ALT 62  ALKPHOS 161*  BILITOT 0.9  PROT 7.4  ALBUMIN 2.7*    Recent Labs Lab 11/16/14 2048  LIPASE 34   No results for input(s): AMMONIA in the last 168 hours. CBC:  Recent Labs Lab 11/16/14 1453  11/18/14 0512 11/19/14 0720 11/20/14 0548 11/21/14 0510 11/22/14 0551  WBC 31.3*  < > 24.1* 24.3* 26.1*  30.6* 25.3*  NEUTROABS 28.8*  --   --   --   --   --   --   HGB 11.5*  < > 10.9* 10.3* 9.9* 10.2* 9.9*  HCT 34.3*  < > 31.8* 29.7* 29.5* 29.9* 30.0*  MCV 80.7  < > 78.7 77.7* 79.1 79.1 80.0  PLT 234  < > 187 150 155 163 196  < > = values in this interval not displayed. Cardiac Enzymes: No results for input(s): CKTOTAL, CKMB, CKMBINDEX, TROPONINI in the last 168 hours. BNP: BNP (last 3 results) No results for input(s): BNP in the last 8760 hours.  ProBNP (last 3 results) No results for input(s): PROBNP in the last 8760 hours.  CBG:  Recent Labs Lab 11/20/14 2124 11/21/14 0723 11/21/14 1227 11/21/14 1654 11/21/14 2301  GLUCAP 104* 91 145* 89 116*

## 2014-11-22 NOTE — Progress Notes (Signed)
Patient may go home today. We may need to give him a [place]  bus ticket ? The patient mentioned that he could ride the bus home but did not have money for the bus.

## 2014-11-22 NOTE — Progress Notes (Signed)
Hypoglycemic Event  CBG: 54  Treatment: Breakfast tray at bedside, pt began eating  Symptoms: None  Follow-up CBG: Time: 0800 CBG Result:110  Possible Reasons for Event: Unknown  Comments/MD notified: Will notify MD when rounding    Marc [name], Marc [name]  Remember to initiate Hypoglycemia Order Set & complete

## 2014-11-23 LAB — LEGIONELLA ANTIGEN, URINE

## 2014-11-24 NOTE — Progress Notes (Signed)
Received call from lab, blood cultures resulted with gram positive cocci in clusters. Discharging MD made aware. Cari Burgo A

## 2014-11-25 ENCOUNTER — Encounter: Payer: Self-pay | Admitting: Internal Medicine

## 2014-11-25 DIAGNOSIS — R7881 Bacteremia: Secondary | ICD-10-CM

## 2014-11-25 NOTE — Progress Notes (Addendum)
Notified by lab blood cultures positive for staph aureus Spoke with Dr. Drue SecondSnider who recommended contacting the pt and arrange that he comes back to hospital   I have called the pt [phone] (cell and home number) and left message to call me asap  Marc Passeylma Marc [name] [facility] [phone]

## 2014-11-27 DIAGNOSIS — B9561 Methicillin susceptible Staphylococcus aureus infection as the cause of diseases classified elsewhere: Secondary | ICD-10-CM

## 2014-11-27 LAB — CULTURE, BLOOD (ROUTINE X 2)

## 2014-11-28 NOTE — Progress Notes (Addendum)
Pt has returned my phone call and i told him his blood culture grew staph aureus He said he will come to emergency room where i will meet him and proceed with direct admission for staph aureus bacteremia. Pt has my cell phone number and will call me when he arrives.  Manson Passeylma [facility] [phone]

## 2014-11-30 ENCOUNTER — Inpatient Hospital Stay (HOSPITAL_COMMUNITY): Payer: Medicaid Other

## 2014-11-30 ENCOUNTER — Encounter (HOSPITAL_COMMUNITY): Payer: Self-pay | Admitting: *Deleted

## 2014-11-30 ENCOUNTER — Inpatient Hospital Stay (HOSPITAL_COMMUNITY)
Admission: EM | Admit: 2014-11-30 | Discharge: 2014-12-04 | DRG: 871 | Disposition: A | Discharge status: Home Health | Payer: Medicaid Other | Attending: Internal Medicine | Admitting: Internal Medicine

## 2014-11-30 ENCOUNTER — Emergency Department (HOSPITAL_COMMUNITY): Payer: Medicaid Other

## 2014-11-30 DIAGNOSIS — Z79899 Other long term (current) drug therapy: Secondary | ICD-10-CM

## 2014-11-30 DIAGNOSIS — A4102 Sepsis due to Methicillin resistant Staphylococcus aureus: Principal | ICD-10-CM | POA: Diagnosis present

## 2014-11-30 DIAGNOSIS — N139 Obstructive and reflux uropathy, unspecified: Secondary | ICD-10-CM | POA: Diagnosis present

## 2014-11-30 DIAGNOSIS — D72829 Elevated white blood cell count, unspecified: Secondary | ICD-10-CM | POA: Diagnosis present

## 2014-11-30 DIAGNOSIS — R7881 Bacteremia: Secondary | ICD-10-CM | POA: Diagnosis present

## 2014-11-30 DIAGNOSIS — B9562 Methicillin resistant Staphylococcus aureus infection as the cause of diseases classified elsewhere: Secondary | ICD-10-CM | POA: Diagnosis present

## 2014-11-30 DIAGNOSIS — E1165 Type 2 diabetes mellitus with hyperglycemia: Secondary | ICD-10-CM | POA: Diagnosis present

## 2014-11-30 DIAGNOSIS — I38 Endocarditis, valve unspecified: Secondary | ICD-10-CM | POA: Diagnosis present

## 2014-11-30 DIAGNOSIS — I10 Essential (primary) hypertension: Secondary | ICD-10-CM | POA: Diagnosis present

## 2014-11-30 DIAGNOSIS — Z720 Tobacco use: Secondary | ICD-10-CM | POA: Diagnosis not present

## 2014-11-30 DIAGNOSIS — Z8249 Family history of ischemic heart disease and other diseases of the circulatory system: Secondary | ICD-10-CM | POA: Diagnosis not present

## 2014-11-30 DIAGNOSIS — I76 Septic arterial embolism: Secondary | ICD-10-CM | POA: Diagnosis present

## 2014-11-30 DIAGNOSIS — Z833 Family history of diabetes mellitus: Secondary | ICD-10-CM

## 2014-11-30 DIAGNOSIS — R509 Fever, unspecified: Secondary | ICD-10-CM | POA: Diagnosis present

## 2014-11-30 DIAGNOSIS — Z87440 Personal history of urinary (tract) infections: Secondary | ICD-10-CM | POA: Diagnosis not present

## 2014-11-30 DIAGNOSIS — Z794 Long term (current) use of insulin: Secondary | ICD-10-CM

## 2014-11-30 DIAGNOSIS — Z9114 Patient's other noncompliance with medication regimen: Secondary | ICD-10-CM | POA: Diagnosis present

## 2014-11-30 DIAGNOSIS — J189 Pneumonia, unspecified organism: Secondary | ICD-10-CM | POA: Diagnosis present

## 2014-11-30 DIAGNOSIS — A419 Sepsis, unspecified organism: Secondary | ICD-10-CM | POA: Diagnosis present

## 2014-11-30 DIAGNOSIS — Z008 Encounter for other general examination: Secondary | ICD-10-CM

## 2014-11-30 DIAGNOSIS — D509 Iron deficiency anemia, unspecified: Secondary | ICD-10-CM | POA: Diagnosis present

## 2014-11-30 DIAGNOSIS — IMO0002 Reserved for concepts with insufficient information to code with codable children: Secondary | ICD-10-CM | POA: Diagnosis present

## 2014-11-30 LAB — BASIC METABOLIC PANEL
Anion gap: 9 (ref 5–15)
BUN: 18 mg/dL (ref 6–20)
CO2: 29 mmol/L (ref 22–32)
Calcium: 8.8 mg/dL — ABNORMAL LOW (ref 8.9–10.3)
Chloride: 96 mmol/L — ABNORMAL LOW (ref 101–111)
Creatinine, Ser: 1.08 mg/dL (ref 0.61–1.24)
GFR calc Af Amer: >60 mL/min (ref >60)
GLUCOSE: 390 mg/dL — AB (ref 65–99)
Potassium: 4.1 mmol/L (ref 3.5–5.1)
Sodium: 134 mmol/L — ABNORMAL LOW (ref 135–145)

## 2014-11-30 LAB — URINALYSIS, ROUTINE W REFLEX MICROSCOPIC
Bilirubin Urine: NEGATIVE
Glucose, UA: >1000 mg/dL — AB
Ketones, ur: NEGATIVE mg/dL
Leukocytes, UA: NEGATIVE
Nitrite: NEGATIVE
PH: 5.5 (ref 5.0–8.0)
PROTEIN: NEGATIVE mg/dL
SPECIFIC GRAVITY, URINE: 1.025 (ref 1.005–1.030)
UROBILINOGEN UA: 0.2 mg/dL (ref 0.0–1.0)

## 2014-11-30 LAB — CBG MONITORING, ED
GLUCOSE-CAPILLARY: 421 mg/dL — AB (ref 65–99)
Glucose-Capillary: 437 mg/dL — ABNORMAL HIGH (ref 65–99)
Glucose-Capillary: 208 mg/dL — ABNORMAL HIGH (ref 65–99)

## 2014-11-30 LAB — I-STAT CHEM 8, ED
BUN: 23 mg/dL — ABNORMAL HIGH (ref 6–20)
Calcium, Ion: 1.17 mmol/L (ref 1.12–1.23)
Chloride: 94 mmol/L — ABNORMAL LOW (ref 101–111)
Creatinine, Ser: 1 mg/dL (ref 0.61–1.24)
Glucose, Bld: 551 mg/dL (ref 65–99)
HCT: 32 % — ABNORMAL LOW (ref 39.0–52.0)
Hemoglobin: 10.9 g/dL — ABNORMAL LOW (ref 13.0–17.0)
POTASSIUM: 4.7 mmol/L (ref 3.5–5.1)
Sodium: 131 mmol/L — ABNORMAL LOW (ref 135–145)
TCO2: 30 mmol/L (ref 0–100)

## 2014-11-30 LAB — CBC
HCT: 28.6 % — ABNORMAL LOW (ref 39.0–52.0)
Hemoglobin: 9.4 g/dL — ABNORMAL LOW (ref 13.0–17.0)
MCH: 27.4 pg (ref 26.0–34.0)
MCHC: 32.9 g/dL (ref 30.0–36.0)
MCV: 83.4 fL (ref 78.0–100.0)
Platelets: 381 10*3/uL (ref 150–400)
RBC: 3.43 MIL/uL — AB (ref 4.22–5.81)
RDW: 15.2 % (ref 11.5–15.5)
WBC: 12 10*3/uL — AB (ref 4.0–10.5)

## 2014-11-30 LAB — I-STAT CG4 LACTIC ACID, ED: Lactic Acid, Venous: 2.67 mmol/L (ref 0.5–2.0)

## 2014-11-30 LAB — PROCALCITONIN: Procalcitonin: 0.22 ng/mL

## 2014-11-30 LAB — URINE MICROSCOPIC-ADD ON

## 2014-11-30 MED ORDER — VANCOMYCIN HCL IN DEXTROSE 1-5 GM/200ML-% IV SOLN
1000.0000 mg | Freq: Three times a day (TID) | INTRAVENOUS | Status: DC
  Administered 2014-12-01 – 2014-12-04 (×10): 1000 mg via INTRAVENOUS
  Filled 2014-11-30 (×12): qty 200

## 2014-11-30 MED ORDER — IOHEXOL 300 MG/ML  SOLN
100.0000 mL | Freq: Once | INTRAMUSCULAR | Status: AC | PRN
  Administered 2014-11-30: 80 mL via INTRAVENOUS

## 2014-11-30 MED ORDER — DEXTROSE-NACL 5-0.45 % IV SOLN: INTRAVENOUS | Status: DC

## 2014-11-30 MED ORDER — SODIUM CHLORIDE 0.9 % IV BOLUS (SEPSIS)
1000.0000 mL | Freq: Once | INTRAVENOUS | Status: AC
  Administered 2014-11-30: 1000 mL via INTRAVENOUS

## 2014-11-30 MED ORDER — SODIUM CHLORIDE 0.9 % IV BOLUS (SEPSIS)
1000.0000 mL | Freq: Once | INTRAVENOUS | Status: AC
Start: 2014-11-30 — End: 2014-11-30
  Administered 2014-11-30: 1000 mL via INTRAVENOUS

## 2014-11-30 MED ORDER — INSULIN ASPART 100 UNIT/ML ~~LOC~~ SOLN
10.0000 [IU] | Freq: Once | SUBCUTANEOUS | Status: AC
  Administered 2014-11-30: 10 [IU] via SUBCUTANEOUS
  Filled 2014-11-30: qty 1

## 2014-11-30 MED ORDER — INSULIN REGULAR HUMAN 100 UNIT/ML IJ SOLN
INTRAMUSCULAR | Status: DC
  Filled 2014-11-30: qty 2.5

## 2014-11-30 MED ORDER — VANCOMYCIN HCL 10 G IV SOLR
1500.0000 mg | Freq: Once | INTRAVENOUS | Status: AC
  Administered 2014-11-30: 1500 mg via INTRAVENOUS
  Filled 2014-11-30: qty 1500

## 2014-11-30 NOTE — Care Management Note (Signed)
Case Management Note  Patient Details  Name: Marc [name] MRN: [id number] Date of Birth: 1961/01/02  Subjective/Objective:      [age] yoM referred to CM by MD for help with Meds.               Action/Plan: [facility]. [facility] and will [facility] in am. Also has appt with [facility]. No further needs . Well versed in the services for thoses with no PCP.   Expected Discharge Date:   (unknown)               Expected Discharge Plan:     In-House Referral:     Discharge planning Services  CM Consult  Post Acute Care Choice:    Choice offered to:     DME Arranged:    DME Agency:     HH Arranged:    HH Agency:     Status of Service:  Completed, signed off  Medicare Important Message Given:    Date Medicare IM Given:    Medicare IM give by:    Date Additional Medicare IM Given:    Additional Medicare Important Message give by:     If discussed at Long Length of Stay Meetings, dates discussed:    Additional Comments:  [name], Phillp Dolores M, RN 11/30/2014, 9:47 PM

## 2014-11-30 NOTE — Care Management Note (Signed)
Case Management Note  Patient Details  Name: Marc [name] MRN: [id number] Date of Birth: [birth year]  Subjective/Objective:                    Action/Plan:   Expected Discharge Date:   (unknown)               Expected Discharge Plan:     In-House Referral:     Discharge planning Services     Post Acute Care Choice:    Choice offered to:     DME Arranged:    DME Agency:     HH Arranged:    HH Agency:     Status of Service:     Medicare Important Message Given:    Date Medicare IM Given:    Medicare IM give by:    Date Additional Medicare IM Given:    Additional Medicare Important Message give by:     If discussed at Long Length of Stay Meetings, dates discussed:    Additional Comments:  [name], [name] M, RN 11/30/2014, 9:10 PM

## 2014-11-30 NOTE — ED Notes (Addendum)
Pt comes in to have the bag of his catheter changed that he was discharged with 8 days ago. Also wants to have his blood sugar checked. Was recently hospitalized with a  UTI. Febrile. CBG 421. Pt states that he got diabetes when he was in prison but since 2013 when he was released he has not even had a glucometer at home. Alert and oriented per norm.

## 2014-11-30 NOTE — Progress Notes (Addendum)
ANTIBIOTIC CONSULT NOTE - INITIAL  Pharmacy Consult for vancomycin/zosyn Indication: MRSA bacteremia/sepsis  No Known Allergies  Patient Measurements:    Vital Signs: Temp: 99.2 F (37.3 C) (07/11 1951) Temp Source: Oral (07/11 1951) BP: 123/55 mmHg (07/11 1951) Pulse Rate: 111 (07/11 1951) Intake/Output from previous day:   Intake/Output from this shift:    Labs:  Recent Labs  11/30/14 1952  WBC 12.0*  HGB 9.4*  PLT 381   CrCl cannot be calculated (Unknown ideal weight.). No results for input(s): VANCOTROUGH, VANCOPEAK, VANCORANDOM, GENTTROUGH, GENTPEAK, GENTRANDOM, TOBRATROUGH, TOBRAPEAK, TOBRARND, AMIKACINPEAK, AMIKACINTROU, AMIKACIN in the last 72 hours.   Microbiology: Recent Results (from the past 720 hour(s))  Urine culture     Status: None   Collection Time: 11/16/14 12:11 PM  Result Value Ref Range Status   Specimen Description URINE, CLEAN CATCH  Final   Special Requests NONE  Final   Culture   Final    MULTIPLE SPECIES PRESENT, SUGGEST RECOLLECTION IF CLINICALLY INDICATED Performed at [facility]    Report Status 11/18/2014 FINAL  Final  Culture, blood (routine x 2) Call MD if unable to obtain prior to antibiotics being given     Status: None   Collection Time: 11/21/14 10:41 AM  Result Value Ref Range Status   Specimen Description BLOOD ARM LEFT  Final   Special Requests   Final    BOTTLES DRAWN AEROBIC AND ANAEROBIC 10CC BOTH BOTTLES   Culture  Setup Time   Final    GRAM POSITIVE COCCI IN CLUSTERS Gram Stain Report Called to,Read Back By and Verified With: M. Long RN 17:50 11/24/14 ([email or web address]) ANAEROBIC BOTTLE ONLY    Culture   Final    METHICILLIN RESISTANT STAPHYLOCOCCUS AUREUS Performed at [facility]    Report Status 11/27/2014 FINAL  Final   Organism ID, Bacteria METHICILLIN RESISTANT STAPHYLOCOCCUS AUREUS  Final      Susceptibility   Methicillin resistant staphylococcus aureus - MIC*    CIPROFLOXACIN >=8 RESISTANT  Resistant     ERYTHROMYCIN >=8 RESISTANT Resistant     GENTAMICIN <=0.5 SENSITIVE Sensitive     OXACILLIN >=4 RESISTANT Resistant     TETRACYCLINE <=1 SENSITIVE Sensitive     VANCOMYCIN 1 SENSITIVE Sensitive     TRIMETH/SULFA <=10 SENSITIVE Sensitive     CLINDAMYCIN <=0.25 SENSITIVE Sensitive     RIFAMPIN <=0.5 SENSITIVE Sensitive     Inducible Clindamycin NEGATIVE Sensitive     * METHICILLIN RESISTANT STAPHYLOCOCCUS AUREUS  Culture, blood (routine x 2) Call MD if unable to obtain prior to antibiotics being given     Status: None   Collection Time: 11/21/14 10:49 AM  Result Value Ref Range Status   Specimen Description BLOOD RIGHT ARM  Final   Special Requests   Final    BOTTLES DRAWN AEROBIC AND ANAEROBIC 10CC BOTH BOTTLES   Culture  Setup Time   Final    GRAM POSITIVE COCCI IN CLUSTERS ANAEROBIC BOTTLE ONLY CRITICAL RESULT CALLED TO, READ BACK BY AND VERIFIED WITH: A DEVINE,MD AT 6962 11/24/14 BY L BENFIELD    Culture   Final    METHICILLIN RESISTANT STAPHYLOCOCCUS AUREUS SUSCEPTIBILITIES PERFORMED ON PREVIOUS CULTURE WITHIN THE LAST 5 DAYS. Performed at [facility]    Report Status 11/27/2014 FINAL  Final    Medical History: Past Medical History  Diagnosis Date  . Diabetes mellitus without complication   . Hypertension     Medications:  Anti-infectives    Start  Dose/Rate Route Frequency Ordered Stop   11/30/14 2045  vancomycin (VANCOCIN) 1,500 mg in sodium chloride 0.9 % 500 mL IVPB     1,500 mg 250 mL/hr over 120 Minutes Intravenous  Once 11/30/14 2026       Assessment: 54 y.o. male recently admitted 6/27 - 7/3 for UTI/pyelonephritis, treated with 3 doses of vancomycin, then transitioned to Levaquin.  Blood cultures from 7/2 resulted with 2/2 MRSA on 7/6, and patient was contacted but did not return to hospital at that time.  Today returns with continued fevers and tachycardia.  Pharmacy to dose vancomycin for MRSA bacteremia.  7/11 >> vancomycin >> 7/12  >> zosyn >>  Tmax: 100 WBCs: mildly elevated Renal: CrCl > 90 CG/N   Goal of Therapy:  Vancomycin trough level 15-20 mcg/ml  Zosyn based on renal function  Eradication of infection Appropriate antibiotic dosing for indication and renal function  Plan:  Day 1 antibiotics Vancomycin 1500 mg IV now, then 1000 mg IV q8 hr Zosyn 3.375g IV Q8H infused over 4hrs.  Measure vancomycin trough levels at steady state as indicated  Follow clinical course, renal function, culture results as available  Follow for de-escalation of antibiotics and LOT   Bernadene Personrew Wofford, PharmD, BCPS Pager: [phone] 11/30/2014, 8:28 PM

## 2014-11-30 NOTE — H&P (Signed)
[facility] History and Physical  Marc [name]:[id number] DOB: [birth year] DOA: 11/30/2014  Referring physician: Mr. Dahlia Client. PCP: No PCP Per Patient none. Specialists: None.  Chief Complaint: Wanted to discontinue his urinary catheter.  HPI: Marc [name] is a 54 y.o. male with history of hypertension and diabetes mellitus type 2 who was admitted last week for sepsis secondary to UTI and pneumonia and was placed on Foley catheter for urinary obstruction presents to the ER because he wanted to have his catheter removed. During last admission patient's blood culture grew MRSA and hospitalist had called him directly for direct admission and patient did not come to the ER. In the ER today patient was found to be febrile and tachycardic. Chest x-ray shows possible septic emboli and CT chest shows multiple nodules with cavitation. Repeat blood cultures have been obtained and patient has been placed on empiric antibiotics and admitted for further management. On exam patient is asymptomatic and denies any chest pain shortness of breath productive cough but did complain of some subjective feeling of fever or chills. Denies any abdominal pain nausea vomiting or diarrhea. Patient's blood sugar is also found to be elevated but not in DKA. Patient has been noncompliant with his medications.   Review of Systems: As presented in the history of presenting illness, rest negative.  Past Medical History  Diagnosis Date  . Diabetes mellitus without complication   . Hypertension    Past Surgical History  Procedure Laterality Date  . No past surgeries     Social History:  reports that he has never smoked. He has never used smokeless tobacco. He reports that he drinks alcohol. He reports that he does not use illicit drugs. Where does patient live home. Can patient participate in ADLs? Yes.  No Known Allergies  Family History:  Family History  Problem Relation Age of Onset  .  Hypertension Mother   . Diabetes Sister   . Diabetes Brother       Prior to Admission medications   Medication Sig Start Date End Date Taking? Authorizing Provider  acetaminophen (TYLENOL) 500 MG tablet Take 500 mg by mouth every 6 (six) hours as needed for mild pain.   Yes Historical Provider, MD  levofloxacin (LEVAQUIN) 500 MG tablet Take 1 tablet (500 mg total) by mouth daily. 11/22/14  Yes Alison Murray, MD  tamsulosin (FLOMAX) 0.4 MG CAPS capsule Take 1 capsule (0.4 mg total) by mouth daily after supper. 11/22/14  Yes Alison Murray, MD  alum & mag hydroxide-simeth (MAALOX/MYLANTA) 200-200-20 MG/5ML suspension Take 30 mLs by mouth every 6 (six) hours as needed for indigestion or heartburn. Patient not taking: Reported on 11/30/2014 11/22/14   Alison Murray, MD  amLODipine (NORVASC) 10 MG tablet Take 1 tablet (10 mg total) by mouth daily. Patient not taking: Reported on 11/30/2014 11/22/14   Alison Murray, MD  folic acid (FOLVITE) 1 MG tablet Take 1 tablet (1 mg total) by mouth daily. Patient not taking: Reported on 11/30/2014 11/22/14   Alison Murray, MD  glimepiride (AMARYL) 2 MG tablet Take 1 tablet (2 mg total) by mouth daily with breakfast. Patient not taking: Reported on 11/30/2014 11/22/14   Alison Murray, MD  glucose blood test strip Use as instructed Patient not taking: Reported on 11/30/2014 11/22/14   Alison Murray, MD  insulin aspart (NOVOLOG) 100 UNIT/ML injection Inject 7 Units into the skin 3 (three) times daily with meals. Patient not taking: Reported on 11/30/2014 11/22/14  Alison MurrayAlma M Devine, MD  insulin glargine (LANTUS) 100 UNIT/ML injection Inject 0.35 mLs (35 Units total) into the skin at bedtime. Patient not taking: Reported on 11/30/2014 11/22/14   Alison MurrayAlma M Devine, MD  Lancets ([facility]K SOFT [facility]) lancets Use as instructed Patient not taking: Reported on 11/30/2014 11/22/14   Alison MurrayAlma M Devine, MD  Multiple Vitamin (MULTIVITAMIN WITH MINERALS) TABS tablet Take 1 tablet by mouth daily. Patient not  taking: Reported on 11/30/2014 11/22/14   Alison MurrayAlma M Devine, MD  pantoprazole (PROTONIX) 40 MG tablet Take 1 tablet (40 mg total) by mouth daily. Patient not taking: Reported on 11/30/2014 11/22/14   Alison MurrayAlma M Devine, MD  potassium chloride SA (K-DUR,KLOR-CON) 20 MEQ tablet Take 1 tablet (20 mEq total) by mouth daily. Patient not taking: Reported on 11/30/2014 11/22/14   Alison MurrayAlma M Devine, MD  thiamine 100 MG tablet Take 1 tablet (100 mg total) by mouth daily. Patient not taking: Reported on 11/30/2014 11/22/14   Alison MurrayAlma M Devine, MD    Physical Exam: Filed Vitals:   11/30/14 1816 11/30/14 1951 11/30/14 2123 11/30/14 2244  BP: 134/71 123/55 119/73 137/77  Pulse: 129 111 103 103  Temp: 100 F (37.8 C) 99.2 F (37.3 C)    TempSrc: Oral Oral    Resp:  12 16 16   SpO2: 98% 99% 98% 99%     General:  Moderately built and poorly nourished.  Eyes: Anicteric no pallor.  ENT: No discharge from the ears eyes nose or mouth.  Neck: No mass felt.  Cardiovascular: S1 and S2 heard.  Respiratory: No rhonchi or crepitations.  Abdomen: Soft nontender bowel sounds present.  Skin: No rash.  Musculoskeletal: No edema.  Psychiatric: Appears normal.  Neurologic: Alert awake oriented to time place and person. Moves all extremities.  Labs on Admission:  Basic Metabolic Panel:  Recent Labs Lab 11/30/14 2056 11/30/14 2129  NA 134* 131*  K 4.1 4.7  CL 96* 94*  CO2 29  --   GLUCOSE 390* 551*  BUN 18 23*  CREATININE 1.08 1.00  CALCIUM 8.8*  --    Liver Function Tests: No results for input(s): AST, ALT, ALKPHOS, BILITOT, PROT, ALBUMIN in the last 168 hours. No results for input(s): LIPASE, AMYLASE in the last 168 hours. No results for input(s): AMMONIA in the last 168 hours. CBC:  Recent Labs Lab 11/30/14 1952 11/30/14 2129  WBC 12.0*  --   HGB 9.4* 10.9*  HCT 28.6* 32.0*  MCV 83.4  --   PLT 381  --    Cardiac Enzymes: No results for input(s): CKTOTAL, CKMB, CKMBINDEX, TROPONINI in the last 168  hours.  BNP (last 3 results) No results for input(s): BNP in the last 8760 hours.  ProBNP (last 3 results) No results for input(s): PROBNP in the last 8760 hours.  CBG:  Recent Labs Lab 11/30/14 1816 11/30/14 1937 11/30/14 2247  GLUCAP 421* 437* 208*    Radiological Exams on Admission: Dg Chest 2 View  11/30/2014   CLINICAL DATA:  Recently hospitalized with urinary tract infection. Fever. History of diabetes. Initial encounter.  EXAM: CHEST  2 VIEW  COMPARISON:  11/21/2014 and 11/18/2014 radiographs.  FINDINGS: The heart size and mediastinal contours are stable without definite adenopathy. There is overall improved aeration of the left lung base. However, the patient has developed ill-defined nodular densities in both lungs worrisome for possible septic emboli. No significant pleural effusion. The bones appear unchanged.  IMPRESSION: Interval increased nodularity to the previously demonstrated bilateral airspace opacities  worrisome for developing septic emboli. No significant pleural effusion.  Further evaluation with chest CT recommended (preferably with contrast if possible).   Electronically Signed   By: Carey Bullocks M.D.   On: 11/30/2014 21:16   Ct Chest W Contrast  11/30/2014   CLINICAL DATA:  Recent diagnosis of urinary tract infection 2 weeks ago. Now developing right lateral chest pain since this morning. Plain radiographs demonstrated increase nodular infiltrates.  EXAM: CT CHEST WITH CONTRAST  TECHNIQUE: Multidetector CT imaging of the chest was performed during intravenous contrast administration.  CONTRAST:  80mL OMNIPAQUE IOHEXOL 300 MG/ML  SOLN  COMPARISON:  Chest 11/30/2014  FINDINGS: Multiple diffuse nodular areas of consolidation throughout both lungs, many with cavitation. Largest lesion is present in the superior segment left lower lobe and measures about 1.8 x 2.3 cm. Appearance is compatible with septic emboli although nonspecific. Short-term follow-up after resolution  of acute process is recommended to exclude metastasis. Differential diagnosis would also include Wegner's granulomatosis or atypical infection such as TB or fungal infection. Small right pleural effusion. No pneumothorax.  Normal heart size. Normal caliber thoracic aorta. Scattered mediastinal and hilar lymph nodes as well as axillary lymph nodes are present without pathologic enlargement. These are likely reactive. Small esophageal hiatal hernia. Esophagus is mostly decompressed.  Included portions of the upper abdominal organs are grossly unremarkable. No destructive bone lesions.  IMPRESSION: Multiple pulmonary nodules bilaterally, many with cavitation. Appearance is compatible with septic emboli although differential diagnosis would include metastasis, TB or fungal infection. Small right pleural effusion.   Electronically Signed   By: Burman Nieves M.D.   On: 11/30/2014 22:45     Assessment/Plan Principal Problem:   Sepsis Active Problems:   Essential hypertension   MRSA bacteremia   Diabetes mellitus type 2, uncontrolled   1. Sepsis with MRSA bacteremia - repeat blood cultures urine cultures have been ordered. Patient has been placed on vancomycin and Zosyn for now. Since CT shows multiple nodules with cavitation and differentials include TB I have ordered QuantiferronGold and placed patient on airborne precautions for now. 2-D echo has been ordered. Check sedimentation rate. Consult infectious disease in a.m. 2. Diabetes mellitus type 2 uncontrolled - patient's blood sugar improved with IV fluids and NovoLog subcutaneous given in the ER. Patient has been noncompliant with his medications. Patient's home medications have been restarted. Closely follow CBGs. 3. Hypertension - continue home medications. 4. Iron deficiency anemia - anemia profile done last week shows an deficiency. Patient will need colonoscopy eventually. Follow CBC. 5. History of alcoholism patient states has not had any  alcohol for last 3 months. 6. Obstructive uropathy on Foley catheter.   DVT Prophylaxis Lovenox.  Code Status: Full code.  Family Communication: Discussed with patient.  Disposition Plan: Admit to inpatient.    Bladyn Tipps N. [facility] Pager [phone].  If 7PM-7AM, please contact night-coverage [email or web address] Password [facility], [place] 11/30/2014, 11:36 PM

## 2014-11-30 NOTE — ED Notes (Signed)
Notified 4W that pt will need to be reassigned to a room that will accommodate Airborne Precautions. 4W stated that I call back in .

## 2014-11-30 NOTE — ED Provider Notes (Signed)
CSN: [id number]     Arrival date & time 11/30/14  1753 History   First MD Initiated Contact with Patient 11/30/14 1935     Chief Complaint  Patient presents with  . Urinary Cath prob   . Blood Sugar Problem     (Consider location/radiation/quality/duration/timing/severity/associated sxs/prior Treatment) HPI Comments: Patient presents to the ED with a chief complaint of requesting catheter to be removed.  Patient states that he thinks that he can urinate on his own now.  Patient was recently admitted for UTI and hyperglycemia.  He was discharged with follow-up instructions, but has not been able to see his doctor yet.  He was notified on 7/8 that the had positive blood cultures, but did not come.  Dr. Elisabeth Pigeon had arranged for a direct admission, but he never showed up.  Patient is still having a fever and is tachycardic in the ED today.  He denies abdominal pain.  States that his blood sugar has been running high because he has not had his insulin.  The history is provided by the patient. No language interpreter was used.    Past Medical History  Diagnosis Date  . Diabetes mellitus without complication   . Hypertension    No past surgical history on file. Family History  Problem Relation Age of Onset  . Hypertension Mother   . Diabetes Sister   . Diabetes Brother    History  Substance Use Topics  . Smoking status: Former Games developer  . Smokeless tobacco: Not on file  . Alcohol Use: Yes    Review of Systems  Constitutional: Negative for fever and chills.  Respiratory: Negative for shortness of breath.   Cardiovascular: Negative for chest pain.  Gastrointestinal: Negative for nausea, vomiting, diarrhea and constipation.  Genitourinary: Negative for dysuria.  All other systems reviewed and are negative.     Allergies  Review of patient's allergies indicates no known allergies.  Home Medications   Prior to Admission medications   Medication Sig Start Date End Date Taking?  Authorizing Provider  acetaminophen (TYLENOL) 500 MG tablet Take 500 mg by mouth every 6 (six) hours as needed for mild pain.    Historical Provider, MD  alum & mag hydroxide-simeth (MAALOX/MYLANTA) 200-200-20 MG/5ML suspension Take 30 mLs by mouth every 6 (six) hours as needed for indigestion or heartburn. 11/22/14   Alison Murray, MD  amLODipine (NORVASC) 10 MG tablet Take 1 tablet (10 mg total) by mouth daily. 11/22/14   Alison Murray, MD  folic acid (FOLVITE) 1 MG tablet Take 1 tablet (1 mg total) by mouth daily. 11/22/14   Alison Murray, MD  glimepiride (AMARYL) 2 MG tablet Take 1 tablet (2 mg total) by mouth daily with breakfast. 11/22/14   Alison Murray, MD  glucose blood test strip Use as instructed 11/22/14   Alison Murray, MD  insulin aspart (NOVOLOG) 100 UNIT/ML injection Inject 7 Units into the skin 3 (three) times daily with meals. 11/22/14   Alison Murray, MD  insulin glargine (LANTUS) 100 UNIT/ML injection Inject 0.35 mLs (35 Units total) into the skin at bedtime. 11/22/14   Alison Murray, MD  Lancets (ACCU-CHEK SOFT TOUCH) lancets Use as instructed 11/22/14   Alison Murray, MD  levofloxacin (LEVAQUIN) 500 MG tablet Take 1 tablet (500 mg total) by mouth daily. 11/22/14   Alison Murray, MD  Multiple Vitamin (MULTIVITAMIN WITH MINERALS) TABS tablet Take 1 tablet by mouth daily. 11/22/14   Alison Murray, MD  pantoprazole (PROTONIX) 40 MG tablet Take 1 tablet (40 mg total) by mouth daily. 11/22/14   Alison Murray, MD  potassium chloride SA (K-DUR,KLOR-CON) 20 MEQ tablet Take 1 tablet (20 mEq total) by mouth daily. 11/22/14   Alison Murray, MD  tamsulosin (FLOMAX) 0.4 MG CAPS capsule Take 1 capsule (0.4 mg total) by mouth daily after supper. 11/22/14   Alison Murray, MD  thiamine 100 MG tablet Take 1 tablet (100 mg total) by mouth daily. 11/22/14   Alison Murray, MD   BP 123/55 mmHg  Pulse 111  Temp(Src) 99.2 F (37.3 C) (Oral)  Resp 12  SpO2 99% Physical Exam  Constitutional: He is oriented to person, place,  and time. He appears well-developed and well-nourished.  Alert, sitting up in bed  HENT:  Head: Normocephalic and atraumatic.  Eyes: Conjunctivae and EOM are normal. Pupils are equal, round, and reactive to light. Right eye exhibits no discharge. Left eye exhibits no discharge. No scleral icterus.  Neck: Normal range of motion. Neck supple. No JVD present.  Cardiovascular: Regular rhythm and normal heart sounds.  Exam reveals no gallop and no friction rub.   No murmur heard. tachycardic  Pulmonary/Chest: Effort normal and breath sounds normal. No respiratory distress. He has no wheezes. He has no rales. He exhibits no tenderness.  No wheezes  Abdominal: Soft. He exhibits no distension and no mass. There is no tenderness. There is no rebound and no guarding.  No focal abdominal tenderness, no RLQ tenderness or pain at McBurney's point, no RUQ tenderness or Murphy's sign, no left-sided abdominal tenderness, no fluid wave, or signs of peritonitis   Genitourinary:  Indwelling catheter  Musculoskeletal: Normal range of motion. He exhibits no edema or tenderness.  Neurological: He is alert and oriented to person, place, and time.  Skin: Skin is warm and dry.  Psychiatric: He has a normal mood and affect. His behavior is normal. Judgment and thought content normal.  Nursing note and vitals reviewed.   ED Course  Procedures (including critical care time) Results for orders placed or performed during the hospital encounter of 11/30/14  Urinalysis, Routine w reflex microscopic-may I&O cath if menses (not at [facility])  Result Value Ref Range   Color, Urine YELLOW YELLOW   APPearance CLEAR CLEAR   Specific Gravity, Urine 1.025 1.005 - 1.030   pH 5.5 5.0 - 8.0   Glucose, UA >1000 (A) NEGATIVE mg/dL   Hgb urine dipstick TRACE (A) NEGATIVE   Bilirubin Urine NEGATIVE NEGATIVE   Ketones, ur NEGATIVE NEGATIVE mg/dL   Protein, ur NEGATIVE NEGATIVE mg/dL   Urobilinogen, UA 0.2 0.0 - 1.0 mg/dL   Nitrite  NEGATIVE NEGATIVE   Leukocytes, UA NEGATIVE NEGATIVE  Urine microscopic-add on  Result Value Ref Range   WBC, UA 3-6 <3 WBC/hpf   RBC / HPF 0-2 <3 RBC/hpf   Bacteria, UA RARE RARE   Urine-Other MUCOUS PRESENT   CBC  Result Value Ref Range   WBC 12.0 (H) 4.0 - 10.5 K/uL   RBC 3.43 (L) 4.22 - 5.81 MIL/uL   Hemoglobin 9.4 (L) 13.0 - 17.0 g/dL   HCT 40.9 (L) 81.1 - 91.4 %   MCV 83.4 78.0 - 100.0 fL   MCH 27.4 26.0 - 34.0 pg   MCHC 32.9 30.0 - 36.0 g/dL   RDW 78.2 95.6 - 21.3 %   Platelets 381 150 - 400 K/uL  CBG monitoring, ED  Result Value Ref Range   Glucose-Capillary 421 (H)  65 - 99 mg/dL  CBG monitoring, ED  Result Value Ref Range   Glucose-Capillary 437 (H) 65 - 99 mg/dL  I-Stat CG4 Lactic Acid, ED  Result Value Ref Range   Lactic Acid, Venous 2.67 (HH) 0.5 - 2.0 mmol/L   Comment NOTIFIED PHYSICIAN   I-stat chem 8, ed  Result Value Ref Range   Sodium 131 (L) 135 - 145 mmol/L   Potassium 4.7 3.5 - 5.1 mmol/L   Chloride 94 (L) 101 - 111 mmol/L   BUN 23 (H) 6 - 20 mg/dL   Creatinine, Ser 1.611.00 0.61 - 1.24 mg/dL   Glucose, Bld 096551 (HH) 65 - 99 mg/dL   Calcium, Ion 0.451.17 4.091.12 - 1.23 mmol/L   TCO2 30 0 - 100 mmol/L   Hemoglobin 10.9 (L) 13.0 - 17.0 g/dL   HCT 81.132.0 (L) 91.439.0 - 78.252.0 %   Comment NOTIFIED PHYSICIAN    Dg Chest 2 View  11/30/2014   CLINICAL DATA:  Recently hospitalized with urinary tract infection. Fever. History of diabetes. Initial encounter.  EXAM: CHEST  2 VIEW  COMPARISON:  11/21/2014 and 11/18/2014 radiographs.  FINDINGS: The heart size and mediastinal contours are stable without definite adenopathy. There is overall improved aeration of the left lung base. However, the patient has developed ill-defined nodular densities in both lungs worrisome for possible septic emboli. No significant pleural effusion. The bones appear unchanged.  IMPRESSION: Interval increased nodularity to the previously demonstrated bilateral airspace opacities worrisome for developing  septic emboli. No significant pleural effusion.  Further evaluation with chest CT recommended (preferably with contrast if possible).   Electronically Signed   By: Carey BullocksWilliam  Veazey M.D.   On: 11/30/2014 21:16   [place] Renal  11/17/2014   CLINICAL DATA:  Acute renal failure, history hypertension, diabetes  EXAM: RENAL / URINARY TRACT ULTRASOUND COMPLETE  COMPARISON:  None  FINDINGS: Right Kidney:  Length: 11.4 cm. Upper normal cortical echogenicity. Normal cortical thickness. Mild collecting system dilatation. No renal mass or shadowing calcification.  Left Kidney:  Length: 13.0 cm. Normal cortical thickness with slight fetal lobulation noted. Slightly increased cortical echogenicity. Mild collecting system dilatation. No mass or shadowing calcification.  Bladder:  Distended, containing significant debris.  No gross/discrete mass.  IMPRESSION: Distended urinary bladder containing significant debris.  Mild BILATERAL renal collecting system dilatation though uncertain if this could be related to bladder distention.   Electronically Signed   By: Ulyses SouthwardMark  Boles M.D.   On: 11/17/2014 10:11   Dg Chest Port 1 View  11/21/2014   CLINICAL DATA:  Sepsis.  EXAM: PORTABLE CHEST - 1 VIEW  COMPARISON:  11/18/2014  FINDINGS: Heart size remains within normal limits. Prominent lung markings could represent edema. Again noted are patchy densities at the right lung base. Increased densities at the left lung base are concerning for pleural fluid and airspace disease. Concern for subtle airspace disease in left upper lung.  IMPRESSION: Increased densities at the left lung base likely represent pleural fluid and airspace disease.  Prominent lung markings with more confluent disease in the left upper lung and right lung base. Findings could represent asymmetric edema versus multi focal airspace disease.   Electronically Signed   By: Richarda OverlieAdam  Henn M.D.   On: 11/21/2014 10:33   Dg Chest Port 1 View  11/18/2014   CLINICAL DATA:  Sepsis and  weakness.  EXAM: PORTABLE CHEST - 1 VIEW  COMPARISON:  03/06/2014  FINDINGS: Subtle patchy densities at the right lung base. Heart size is normal.  The trachea is midline. There is fullness in the mediastinum which could represent vascular congestion. Negative for a pneumothorax.  IMPRESSION: Patchy densities in the right lower lung with possible vascular congestion. Differential includes asymmetric pulmonary edema versus right lower lobe pneumonia/airspace disease. Recommend follow-up evaluation.   Electronically Signed   By: Richarda Overlie M.D.   On: 11/18/2014 15:26      EKG Interpretation None      MDM   Final diagnoses:  Encounter for medical assessment  Septic embolism  MRSA bacteremia    Patient here to have his catheter removed. He was admitted approximately one week ago for urinary tract infection and hyperglycemia with DKA. Patient was discharged on 7/6, and notified on 7/8 that he had positive blood cultures. It was arranged for him to be directly admitted to the hospital, but the patient never showed up. When the patient arrived today, he stated that he just wanted to have his catheter removed. It was noted that the patient was febrile to 100, tachycardic to 129, and was normotensive. Patient discussed with Dr. Lynelle Doctor, who agrees patient will need to be readmitted to the hospital for management of his MRSA bacteremia. Will start vancomycin. Discussed the patient with hospitalist, Dr. Toniann Fail, who I appreciate for admitting the patient. Dr. Toniann Fail recommends starting the patient on an insulin drip as his glucose has been increasing despite receiving fluids in the emergency department. Chest x-ray in the ED shows possible septic emboli, I will order a CT chest with contrast for further evaluation. Blood cultures, urine cultures, and pro-calcitonin level are pending.  Medications  vancomycin (VANCOCIN) 1,500 mg in sodium chloride 0.9 % 500 mL IVPB (1,500 mg Intravenous New Bag/Given  11/30/14 2120)  vancomycin (VANCOCIN) IVPB 1000 mg/200 mL premix (not administered)  insulin regular (NOVOLIN R,HUMULIN R) 250 Units in sodium chloride 0.9 % 250 mL (1 Units/mL) infusion (not administered)  dextrose 5 %-0.45 % sodium chloride infusion (not administered)  sodium chloride 0.9 % bolus 1,000 mL (1,000 mLs Intravenous New Bag/Given 11/30/14 2032)  insulin aspart (novoLOG) injection 10 Units (10 Units Subcutaneous Given 11/30/14 2033)  sodium chloride 0.9 % bolus 1,000 mL (1,000 mLs Intravenous New Bag/Given 11/30/14 2121)   CRITICAL CARE Performed by: Roxy Horseman   Total critical care time: 45  Critical care time was exclusive of separately billable procedures and treating other patients.  Critical care was necessary to treat or prevent imminent or life-threatening deterioration.  Critical care was time spent personally by me on the following activities: development of treatment plan with patient and/or surrogate as well as nursing, discussions with consultants, evaluation of patient's response to treatment, examination of patient, obtaining history from patient or surrogate, ordering and performing treatments and interventions, ordering and review of laboratory studies, ordering and review of radiographic studies, pulse oximetry and re-evaluation of patient's condition.     Roxy Horseman, PA-C 11/30/14 2352  Linwood Dibbles, MD 12/02/14 [phone]

## 2014-11-30 NOTE — Progress Notes (Signed)
Pt has been seen in past and given resources for PCP. Not elligible for MATCH as he was assisted last 02/2014

## 2014-12-01 ENCOUNTER — Inpatient Hospital Stay (HOSPITAL_COMMUNITY): Payer: Medicaid Other

## 2014-12-01 DIAGNOSIS — I509 Heart failure, unspecified: Secondary | ICD-10-CM

## 2014-12-01 DIAGNOSIS — A419 Sepsis, unspecified organism: Secondary | ICD-10-CM

## 2014-12-01 DIAGNOSIS — E119 Type 2 diabetes mellitus without complications: Secondary | ICD-10-CM

## 2014-12-01 DIAGNOSIS — J188 Other pneumonia, unspecified organism: Secondary | ICD-10-CM

## 2014-12-01 LAB — SEDIMENTATION RATE: Sed Rate: 92 mm/hr — ABNORMAL HIGH (ref 0–16)

## 2014-12-01 LAB — COMPREHENSIVE METABOLIC PANEL
ALT: 38 U/L (ref 17–63)
AST: 21 U/L (ref 15–41)
Albumin: 2.1 g/dL — ABNORMAL LOW (ref 3.5–5.0)
Alkaline Phosphatase: 187 U/L — ABNORMAL HIGH (ref 38–126)
Anion gap: 6 (ref 5–15)
BILIRUBIN TOTAL: 0.7 mg/dL (ref 0.3–1.2)
BUN: 15 mg/dL (ref 6–20)
CALCIUM: 8 mg/dL — AB (ref 8.9–10.3)
CO2: 28 mmol/L (ref 22–32)
Chloride: 102 mmol/L (ref 101–111)
Creatinine, Ser: 0.91 mg/dL (ref 0.61–1.24)
GFR calc non Af Amer: >60 mL/min (ref >60)
GLUCOSE: 338 mg/dL — AB (ref 65–99)
Potassium: 3.6 mmol/L (ref 3.5–5.1)
Sodium: 136 mmol/L (ref 135–145)
Total Protein: 6.9 g/dL (ref 6.5–8.1)

## 2014-12-01 LAB — CBC WITH DIFFERENTIAL/PLATELET
BASOS PCT: 0 % (ref 0–1)
Basophils Absolute: 0 10*3/uL (ref 0.0–0.1)
EOS ABS: 0.1 10*3/uL (ref 0.0–0.7)
Eosinophils Relative: 1 % (ref 0–5)
HCT: 28.1 % — ABNORMAL LOW (ref 39.0–52.0)
Hemoglobin: 8.8 g/dL — ABNORMAL LOW (ref 13.0–17.0)
LYMPHS ABS: 1.2 10*3/uL (ref 0.7–4.0)
Lymphocytes Relative: 12 % (ref 12–46)
MCH: 26.1 pg (ref 26.0–34.0)
MCHC: 31.3 g/dL (ref 30.0–36.0)
MCV: 83.4 fL (ref 78.0–100.0)
Monocytes Absolute: 0.7 10*3/uL (ref 0.1–1.0)
Monocytes Relative: 6 % (ref 3–12)
NEUTROS PCT: 81 % — AB (ref 43–77)
Neutro Abs: 8.7 10*3/uL — ABNORMAL HIGH (ref 1.7–7.7)
Platelets: 379 10*3/uL (ref 150–400)
RBC: 3.37 MIL/uL — ABNORMAL LOW (ref 4.22–5.81)
RDW: 14.9 % (ref 11.5–15.5)
WBC: 10.7 10*3/uL — AB (ref 4.0–10.5)

## 2014-12-01 LAB — LACTIC ACID, PLASMA: Lactic Acid, Venous: 1.2 mmol/L (ref 0.5–2.0)

## 2014-12-01 LAB — PROCALCITONIN: Procalcitonin: 0.18 ng/mL

## 2014-12-01 LAB — TYPE AND SCREEN
ABO/RH(D): A POS
Antibody Screen: NEGATIVE

## 2014-12-01 LAB — GLUCOSE, CAPILLARY
GLUCOSE-CAPILLARY: 253 mg/dL — AB (ref 65–99)
GLUCOSE-CAPILLARY: 236 mg/dL — AB (ref 65–99)
Glucose-Capillary: 324 mg/dL — ABNORMAL HIGH (ref 65–99)
Glucose-Capillary: 125 mg/dL — ABNORMAL HIGH (ref 65–99)
Glucose-Capillary: 125 mg/dL — ABNORMAL HIGH (ref 65–99)

## 2014-12-01 LAB — VANCOMYCIN, TROUGH: Vancomycin Tr: 20 ug/mL (ref 10.0–20.0)

## 2014-12-01 LAB — APTT: aPTT: 38 seconds — ABNORMAL HIGH (ref 24–37)

## 2014-12-01 LAB — PROTIME-INR
INR: 1.15 (ref 0.00–1.49)
Prothrombin Time: 14.9 seconds (ref 11.6–15.2)

## 2014-12-01 LAB — ABO/RH: ABO/RH(D): A POS

## 2014-12-01 MED ORDER — INSULIN ASPART 100 UNIT/ML ~~LOC~~ SOLN
0.0000 [IU] | Freq: Three times a day (TID) | SUBCUTANEOUS | Status: DC
  Administered 2014-12-01: 5 [IU] via SUBCUTANEOUS
  Administered 2014-12-01: 1 [IU] via SUBCUTANEOUS
  Administered 2014-12-01: 3 [IU] via SUBCUTANEOUS
  Administered 2014-12-02: 1 [IU] via SUBCUTANEOUS
  Administered 2014-12-02: 2 [IU] via SUBCUTANEOUS

## 2014-12-01 MED ORDER — TAMSULOSIN HCL 0.4 MG PO CAPS
0.4000 mg | ORAL_CAPSULE | Freq: Every day | ORAL | Status: DC
  Administered 2014-12-01 – 2014-12-04 (×4): 0.4 mg via ORAL
  Filled 2014-12-01 (×3): qty 1

## 2014-12-01 MED ORDER — FOLIC ACID 1 MG PO TABS
1.0000 mg | ORAL_TABLET | Freq: Every day | ORAL | Status: DC
Start: 2014-12-01 — End: 2014-12-05
  Administered 2014-12-01 – 2014-12-04 (×4): 1 mg via ORAL
  Filled 2014-12-01 (×4): qty 1

## 2014-12-01 MED ORDER — VITAMIN B-1 100 MG PO TABS
100.0000 mg | ORAL_TABLET | Freq: Every day | ORAL | Status: DC
  Administered 2014-12-01 – 2014-12-04 (×4): 100 mg via ORAL
  Filled 2014-12-01 (×4): qty 1

## 2014-12-01 MED ORDER — ACETAMINOPHEN 325 MG PO TABS
650.0000 mg | ORAL_TABLET | Freq: Four times a day (QID) | ORAL | Status: DC | PRN
Start: 2014-12-01 — End: 2014-12-05

## 2014-12-01 MED ORDER — INSULIN GLARGINE 100 UNIT/ML ~~LOC~~ SOLN
35.0000 [IU] | Freq: Every day | SUBCUTANEOUS | Status: DC
  Administered 2014-12-01 – 2014-12-04 (×5): 35 [IU] via SUBCUTANEOUS
  Filled 2014-12-01 (×6): qty 0.35

## 2014-12-01 MED ORDER — ONDANSETRON HCL 4 MG PO TABS: 4.0000 mg | ORAL_TABLET | Freq: Four times a day (QID) | ORAL | Status: DC | PRN

## 2014-12-01 MED ORDER — ACETAMINOPHEN 650 MG RE SUPP
650.0000 mg | Freq: Four times a day (QID) | RECTAL | Status: DC | PRN
Start: 2014-12-01 — End: 2014-12-05

## 2014-12-01 MED ORDER — AMLODIPINE BESYLATE 10 MG PO TABS
10.0000 mg | ORAL_TABLET | Freq: Every day | ORAL | Status: DC
  Administered 2014-12-01 – 2014-12-04 (×4): 10 mg via ORAL
  Filled 2014-12-01 (×4): qty 1

## 2014-12-01 MED ORDER — GLIMEPIRIDE 2 MG PO TABS
2.0000 mg | ORAL_TABLET | Freq: Every day | ORAL | Status: DC
  Administered 2014-12-01 – 2014-12-04 (×4): 2 mg via ORAL
  Filled 2014-12-01 (×6): qty 1

## 2014-12-01 MED ORDER — ONDANSETRON HCL 4 MG/2ML IJ SOLN: 4.0000 mg | Freq: Four times a day (QID) | INTRAMUSCULAR | Status: DC | PRN

## 2014-12-01 MED ORDER — SODIUM CHLORIDE 0.9 % IV SOLN
INTRAVENOUS | Status: AC
  Administered 2014-12-01 (×3): via INTRAVENOUS

## 2014-12-01 MED ORDER — ENOXAPARIN SODIUM 40 MG/0.4ML ~~LOC~~ SOLN
40.0000 mg | SUBCUTANEOUS | Status: DC
  Administered 2014-12-01 – 2014-12-03 (×3): 40 mg via SUBCUTANEOUS
  Filled 2014-12-01 (×4): qty 0.4

## 2014-12-01 MED ORDER — PIPERACILLIN-TAZOBACTAM 3.375 G IVPB
3.3750 g | Freq: Three times a day (TID) | INTRAVENOUS | Status: DC
  Administered 2014-12-01 (×3): 3.375 g via INTRAVENOUS
  Filled 2014-12-01 (×4): qty 50

## 2014-12-01 NOTE — Progress Notes (Signed)
Inpatient Diabetes Program Recommendations  AACE/ADA: New Consensus Statement on Inpatient Glycemic Control (2013)  Target Ranges:  Prepandial:   less than 140 mg/dL      Peak postprandial:   less than 180 mg/dL (1-2 hours)      Critically ill patients:  140 - 180 mg/dL   Reason for Visit: Hyperglycemia  Diabetes history: DM2 Outpatient Diabetes medications: Not taking - Lantus 35 units QHS, Novolog 7 units tidwc and Amaryl 2 mg QD Current orders for Inpatient glycemic control: Lantus 35 units QHS, Amaryl 2 mg QD, novolog sensitive tidwc  Results for Earnest RosierHARRINGTON, Jahsiah L (MRN [id number]) as of 12/01/2014 16:18  Ref. Range 11/30/2014 18:16 11/30/2014 19:37 11/30/2014 22:47 12/01/2014 01:07 12/01/2014 06:24 12/01/2014 11:35  Glucose-Capillary Latest Ref Range: 65-99 mg/dL 782421 (H) 956437 (H) 213208 (H) 324 (H) 253 (H) 236 (H)   Results for Earnest RosierHARRINGTON, Tyrell L (MRN [id number]) as of 12/01/2014 16:18  Ref. Range 11/16/2014 20:44  Hemoglobin A1C Latest Ref Range: 4.8-5.6 % 12.1 (H)   Uncontrolled blood sugars on admission. Familiar with pt from previous admission. Has no insurance and will need to go home on affordable insulin.  Recommendations  D/C Lantus and begin 70/30 25 units bid. ([facility])  Will also need meter - [employer] ($14 + strips and lancets.)  Will follow while inpatient. Thank you. Ailene Ardshonda Gunnar Hereford, RD, LDN, CDE Inpatient Diabetes Coordinator [phone]

## 2014-12-01 NOTE — Progress Notes (Signed)
*  PRELIMINARY RESULTS* Echocardiogram 2D Echocardiogram has been performed.  Jeryl Columbialliott, Deklin Bieler 12/01/2014, 2:15 PM

## 2014-12-01 NOTE — Progress Notes (Signed)
[facility] PROGRESS NOTE  Marc [name] Elvin HKV:[id number] DOB: [birth year] DOA: 11/30/2014 PCP: No PCP Per Patient   Brief narrative: Patient is 54 year old with history of hypertension diabetes mellitus who was at admitted with sepsis with MRSA bacteremia and CT findings reporting multiple nodules and cavitations. ID consulted  Assessment/Plan: Principal Problem:   Sepsis - Continue vancomycin. -ID consulted and currently assisting further workup and choice of antibiotics. - CT findings the chest suspicious for TB and patient undergoing workup  Active Problems:   Essential hypertension -We'll continue amlodipine. Continue monitor blood pressures and adjust antihypertensives medications accordingly.    MRSA bacteremia - ID consulted - continued vancomycin - f/u with Echocardiogram results    Diabetes mellitus type 2, uncontrolled - Continue Lantus - Continue SSI and Amaryl  Code Status: Full Family Communication: No family at bedside  Disposition Plan: Pending improvement in condition   Consultants:  Infectious disease  Procedures:  None  Antibiotics:  Vancomycin  HPI/Subjective: Patient has no new complaints today. States he feels better  Objective: Filed Vitals:   12/01/14 1544  BP: 155/75  Pulse: 99  Temp: 98.4 F (36.9 C)  Resp:     Intake/Output Summary (Last 24 hours) at 12/01/14 1908 Last data filed at 12/01/14 1827  Gross per 24 hour  Intake   4065 ml  Output   4160 ml  Net    -95 ml   Filed Weights   12/01/14 0047 12/01/14 0457  Weight: 75.66 kg (166 lb 12.8 oz) 75.66 kg (166 lb 12.8 oz)    Exam:   General:  Patient in no acute distress, alert and awake  Cardiovascular: Regular rate and rhythm, no murmurs or rubs  Respiratory: Breathing comfortably on room air, equal chest rise, no audible wheezes  Abdomen: Soft, nondistended, nontender  Musculoskeletal: No cyanosis or clubbing   Data Reviewed: Basic Metabolic  Panel:  Recent Labs Lab 11/30/14 2056 11/30/14 2129 12/01/14 0117  NA 134* 131* 136  K 4.1 4.7 3.6  CL 96* 94* 102  CO2 29  --  28  GLUCOSE 390* 551* 338*  BUN 18 23* 15  CREATININE 1.08 1.00 0.91  CALCIUM 8.8*  --  8.0*   Liver Function Tests:  Recent Labs Lab 12/01/14 0117  AST 21  ALT 38  ALKPHOS 187*  BILITOT 0.7  PROT 6.9  ALBUMIN 2.1*   No results for input(s): LIPASE, AMYLASE in the last 168 hours. No results for input(s): AMMONIA in the last 168 hours. CBC:  Recent Labs Lab 11/30/14 1952 11/30/14 2129 12/01/14 0117  WBC 12.0*  --  10.7*  NEUTROABS  --   --  8.7*  HGB 9.4* 10.9* 8.8*  HCT 28.6* 32.0* 28.1*  MCV 83.4  --  83.4  PLT 381  --  379   Cardiac Enzymes: No results for input(s): CKTOTAL, CKMB, CKMBINDEX, TROPONINI in the last 168 hours. BNP (last 3 results) No results for input(s): BNP in the last 8760 hours.  ProBNP (last 3 results) No results for input(s): PROBNP in the last 8760 hours.  CBG:  Recent Labs Lab 11/30/14 2247 12/01/14 0107 12/01/14 0624 12/01/14 1135 12/01/14 1647  GLUCAP 208* 324* 253* 236* 125*    Recent Results (from the past 240 hour(s))  Blood culture (routine x 2)     Status: None (Preliminary result)   Collection Time: 11/30/14  8:56 PM  Result Value Ref Range Status   Specimen Description BLOOD RIGHT HAND  Final   Special Requests  BOTTLES DRAWN AEROBIC AND ANAEROBIC 10CC EA  Final   Culture   Final    NO GROWTH < 24 HOURS Performed at [facility]    Report Status PENDING  Incomplete  Blood culture (routine x 2)     Status: None (Preliminary result)   Collection Time: 11/30/14  9:19 PM  Result Value Ref Range Status   Specimen Description BLOOD LEFT ANTECUBITAL  Final   Special Requests BOTTLES DRAWN AEROBIC AND ANAEROBIC 5CC EA  Final   Culture   Final    NO GROWTH < 24 HOURS Performed at [facility]    Report Status PENDING  Incomplete     Studies: Dg Chest 2  View  11/30/2014   CLINICAL DATA:  Recently hospitalized with urinary tract infection. Fever. History of diabetes. Initial encounter.  EXAM: CHEST  2 VIEW  COMPARISON:  11/21/2014 and 11/18/2014 radiographs.  FINDINGS: The heart size and mediastinal contours are stable without definite adenopathy. There is overall improved aeration of the left lung base. However, the patient has developed ill-defined nodular densities in both lungs worrisome for possible septic emboli. No significant pleural effusion. The bones appear unchanged.  IMPRESSION: Interval increased nodularity to the previously demonstrated bilateral airspace opacities worrisome for developing septic emboli. No significant pleural effusion.  Further evaluation with chest CT recommended (preferably with contrast if possible).   Electronically Signed   By: Carey Bullocks M.D.   On: 11/30/2014 21:16   Ct Chest W Contrast  11/30/2014   CLINICAL DATA:  Recent diagnosis of urinary tract infection 2 weeks ago. Now developing right lateral chest pain since this morning. Plain radiographs demonstrated increase nodular infiltrates.  EXAM: CT CHEST WITH CONTRAST  TECHNIQUE: Multidetector CT imaging of the chest was performed during intravenous contrast administration.  CONTRAST:  80mL OMNIPAQUE IOHEXOL 300 MG/ML  SOLN  COMPARISON:  Chest 11/30/2014  FINDINGS: Multiple diffuse nodular areas of consolidation throughout both lungs, many with cavitation. Largest lesion is present in the superior segment left lower lobe and measures about 1.8 x 2.3 cm. Appearance is compatible with septic emboli although nonspecific. Short-term follow-up after resolution of acute process is recommended to exclude metastasis. Differential diagnosis would also include Wegner's granulomatosis or atypical infection such as TB or fungal infection. Small right pleural effusion. No pneumothorax.  Normal heart size. Normal caliber thoracic aorta. Scattered mediastinal and hilar lymph nodes  as well as axillary lymph nodes are present without pathologic enlargement. These are likely reactive. Small esophageal hiatal hernia. Esophagus is mostly decompressed.  Included portions of the upper abdominal organs are grossly unremarkable. No destructive bone lesions.  IMPRESSION: Multiple pulmonary nodules bilaterally, many with cavitation. Appearance is compatible with septic emboli although differential diagnosis would include metastasis, TB or fungal infection. Small right pleural effusion.   Electronically Signed   By: Burman Nieves M.D.   On: 11/30/2014 22:45    Scheduled Meds: . amLODipine  10 mg Oral Daily  . enoxaparin (LOVENOX) injection  40 mg Subcutaneous Q24H  . folic acid  1 mg Oral Daily  . glimepiride  2 mg Oral Q breakfast  . insulin aspart  0-9 Units Subcutaneous TID WC  . insulin glargine  35 Units Subcutaneous QHS  . tamsulosin  0.4 mg Oral QPC supper  . thiamine  100 mg Oral Daily  . vancomycin  1,000 mg Intravenous Q8H   Continuous Infusions: . sodium chloride 125 mL/hr at 12/01/14 0927     Time spent: > 30 minutes  Penny Pia  [facility] Pager [phone] If 7PM-7AM, please contact night-coverage at [email or web address], password [facility] 12/01/2014, 7:08 PM  LOS: 1 day

## 2014-12-01 NOTE — Progress Notes (Signed)
ANTIBIOTIC CONSULT NOTE - FOLLOW UP  Pharmacy Consult for Vancomycin Indication: sepsis, MRSA bacteremia, pulmonary infection  No Known Allergies  Patient Measurements: Height: 5\' 10"  (177.8 cm) Weight: 166 lb 12.8 oz (75.66 kg) IBW/kg (Calculated) : 73 Adjusted Body Weight:   Vital Signs: Temp: 98 F (36.7 C) (07/12 2057) Temp Source: Oral (07/12 2057) BP: 160/84 mmHg (07/12 2057) Pulse Rate: 104 (07/12 2057) Intake/Output from previous day: 07/11 0701 - 07/12 0700 In: 1023.8 [P.O.:480; I.V.:493.8; IV Piggyback:50] Out: 1860 [Urine:1860] Intake/Output from this shift: Total I/O In: -  Out: 1500 [Urine:1500]  Labs:  Recent Labs  11/30/14 1952 11/30/14 2056 11/30/14 2129 12/01/14 0117  WBC 12.0*  --   --  10.7*  HGB 9.4*  --  10.9* 8.8*  PLT 381  --   --  379  CREATININE  --  1.08 1.00 0.91   Estimated Creatinine Clearance: 98 mL/min (by C-G formula based on Cr of 0.91).  Recent Labs  12[facility]01/14 2110  [facility]     Microbiology: Recent Results (from the past 720 hour(s))  Urine culture     Status: None   Collection Time: 11/16/14 12:11 PM  Result Value Ref Range Status   Specimen Description URINE, CLEAN CATCH  Final   Special Requests NONE  Final   Culture   Final    MULTIPLE SPECIES PRESENT, SUGGEST RECOLLECTION IF CLINICALLY INDICATED [facility]    Report Status 11/18/2014 FINAL  Final  Culture, blood (routine x 2) Call MD if unable to obtain prior to antibiotics being given     Status: None   Collection Time: 11/21/14 10:41 AM  Result Value Ref Range Status   Specimen Description BLOOD ARM LEFT  Final   Special Requests   Final    BOTTLES DRAWN AEROBIC AND ANAEROBIC 10CC BOTH BOTTLES   Culture  Setup Time   Final    GRAM POSITIVE COCCI IN CLUSTERS Gram Stain Report Called to,Read Back By and Verified With: M. Long RN 17:50 11/24/14 ([email or web address]) ANAEROBIC BOTTLE ONLY    Culture   Final    METHICILLIN RESISTANT  STAPHYLOCOCCUS AUREUS Performed at [facility]    Report Status 11/27/2014 FINAL  Final   Organism ID, Bacteria METHICILLIN RESISTANT STAPHYLOCOCCUS AUREUS  Final      Susceptibility   Methicillin resistant staphylococcus aureus - MIC*    CIPROFLOXACIN >=8 RESISTANT Resistant     ERYTHROMYCIN >=8 RESISTANT Resistant     GENTAMICIN <=0.5 SENSITIVE Sensitive     OXACILLIN >=4 RESISTANT Resistant     TETRACYCLINE <=1 SENSITIVE Sensitive     VANCOMYCIN 1 SENSITIVE Sensitive     TRIMETH/SULFA <=10 SENSITIVE Sensitive     CLINDAMYCIN <=0.25 SENSITIVE Sensitive     RIFAMPIN <=0.5 SENSITIVE Sensitive     Inducible Clindamycin NEGATIVE Sensitive     * METHICILLIN RESISTANT STAPHYLOCOCCUS AUREUS  Culture, blood (routine x 2) Call MD if unable to obtain prior to antibiotics being given     Status: None   Collection Time: 11/21/14 10:49 AM  Result Value Ref Range Status   Specimen Description BLOOD RIGHT ARM  Final   Special Requests   Final    BOTTLES DRAWN AEROBIC AND ANAEROBIC 10CC BOTH BOTTLES   Culture  Setup Time   Final    GRAM POSITIVE COCCI IN CLUSTERS ANAEROBIC BOTTLE ONLY CRITICAL RESULT CALLED TO, READ BACK BY AND VERIFIED WITH: A DEVINE,MD AT [id number] 11/24/14 BY L BENFIELD    Culture  Final    METHICILLIN RESISTANT STAPHYLOCOCCUS AUREUS SUSCEPTIBILITIES PERFORMED ON PREVIOUS CULTURE WITHIN THE LAST 5 DAYS. Performed at [facility]    Report Status 11/27/2014 FINAL  Final  Blood culture (routine x 2)     Status: None (Preliminary result)   Collection Time: 11/30/14  8:56 PM  Result Value Ref Range Status   Specimen Description BLOOD RIGHT HAND  Final   Special Requests BOTTLES DRAWN AEROBIC AND ANAEROBIC 10CC EA  Final   Culture   Final    NO GROWTH < 24 HOURS Performed at [facility]    Report Status PENDING  Incomplete  Blood culture (routine x 2)     Status: None (Preliminary result)   Collection Time: 11/30/14  9:19 PM  Result Value Ref Range  Status   Specimen Description BLOOD LEFT ANTECUBITAL  Final   Special Requests BOTTLES DRAWN AEROBIC AND ANAEROBIC 5CC EA  Final   Culture   Final    NO GROWTH < 24 HOURS Performed at [facility]    Report Status PENDING  Incomplete    Anti-infectives    Start     Dose/Rate Route Frequency Ordered Stop   12/01/14 0600  vancomycin (VANCOCIN) IVPB 1000 mg/200 mL premix     1,000 mg 200 mL/hr over 60 Minutes Intravenous Every 8 hours 11/30/14 2057     12/01/14 0100  piperacillin-tazobactam (ZOSYN) IVPB 3.375 g  Status:  Discontinued     3.375 g 12.5 mL/hr over 240 Minutes Intravenous 3 times per day 12/01/14 0051 12/01/14 1701   11/30/14 2045  vancomycin (VANCOCIN) 1,500 mg in sodium chloride 0.9 % 500 mL IVPB     1,500 mg 250 mL/hr over 120 Minutes Intravenous  Once 11/30/14 2026 11/30/14 2343      Assessment: 54 yoM admitted with sepsis, MRSA bacteremia, started on Vancomycin per pharmacy dosing.  CT chest also showing multiple nodules with cavitation so Zosyn(d/c 7/12) added and patient on airborne precautions since TB in differential.  ID consulted.  7/11 >> vancomycin >> 7/12 >> zosyn >> 7/12  7/11 blood x 2: collected 7/11 urine: collected 7/2 blood x 2: 2/2 MRSA  7/12 Quantiferon tb gold: IP  Today, 12/01/14 2110 VT=20 mg/L Scr =0.91 (stable)  WBC, LA, PCT improved today.  Currently afebrile.    Goal of Therapy:  Vancomycin trough level 15-20 mcg/ml  Doses adjusted per renal function Eradication of infection  Plan:  1.  Continue Vancomycin @ 1Gm IV q8h 2.  F/u SCr/additional levels as needed  Lorenza Evangelist 12/01/2014,9:53 PM

## 2014-12-01 NOTE — Consult Note (Signed)
[facility]  Date of Admission:  11/30/2014  Date of Consult:  12/01/2014  Reason for Consult: Cavitary Pneumonia Referring Physician: Wendee Beavers  Impression/Recommendation MRSA bacteremia Nodular, cavitary Pneumonia DM2 HTN Tobacco use ETOH use  Would Stop zosyn Check TEE Check sputum AFB- if he does not produce, get BAL Check Hep C  Comment- I suspect he has endocarditis with septic emboli. He does have risk factors for TB however.   Thank you so much for this interesting consult,   Bobby Rumpf (pager) [phone] [email or web address]  Marc [name] is an 54 y.o. male.  HPI: 54 yo M with DM2 and HTN who was adm 6-27 to 7-3 to [facility] with DKA (CBG >500, gap+), CAP and UTI/pyelo.  His course was complicated by obstructive uropathy and he was d/c with foley.  His CXR was notable for LUL and RL base edema vs multifocal infiltrate. He was treated with vanco/zosyn in hospital and then d/c home with levaquin.  On 7-6 he was found to have BCx+ Staph aureus, he was called to return to hospital.  He did return on 7-11 to have his foley removed, still having fevers and elevated FSG due to not having insulin. He was found on CXR to have multiple nodules. WBC 12.0. Glc 551, Cr normal. He was started on vanco/zosyn.  A TTE was done today and was (-).  CT chest showed : Multiple pulmonary nodules bilaterally, many with cavitation. Appearance is compatible with septic emboli although differential diagnosis would include metastasis, TB or fungal infection. Small right pleural effusion.  Past Medical History  Diagnosis Date  . Diabetes mellitus without complication   . Hypertension     Past Surgical History  Procedure Laterality Date  . No past surgeries       No Known Allergies  Medications:  Scheduled: . amLODipine  10 mg Oral Daily  . enoxaparin (LOVENOX) injection  40 mg Subcutaneous Q24H  . folic acid  1 mg Oral Daily  . glimepiride  2 mg  Oral Q breakfast  . insulin aspart  0-9 Units Subcutaneous TID WC  . insulin glargine  35 Units Subcutaneous QHS  . piperacillin-tazobactam (ZOSYN)  IV  3.375 g Intravenous 3 times per day  . tamsulosin  0.4 mg Oral QPC supper  . thiamine  100 mg Oral Daily  . vancomycin  1,000 mg Intravenous Q8H    Abtx:  Anti-infectives    Start     Dose/Rate Route Frequency Ordered Stop   12/01/14 0600  vancomycin (VANCOCIN) IVPB 1000 mg/200 mL premix     1,000 mg 200 mL/hr over 60 Minutes Intravenous Every 8 hours 11/30/14 2057     12/01/14 0100  piperacillin-tazobactam (ZOSYN) IVPB 3.375 g     3.375 g 12.5 mL/hr over 240 Minutes Intravenous 3 times per day 12/01/14 0051     11/30/14 2045  vancomycin (VANCOCIN) 1,500 mg in sodium chloride 0.9 % 500 mL IVPB     1,500 mg 250 mL/hr over 120 Minutes Intravenous  Once 11/30/14 2026 11/30/14 2343      Total days of antibiotics: 1 vanco/zosyn          Social History:  reports that he has never smoked. He has never used smokeless tobacco. He reports that he drinks alcohol. He reports that he does not use illicit drugs.   He was in prison in 2013, was also homeless then. He had negative PPD in prison.  Denies TB contacts. Denies overseas  travel.   Family History  Problem Relation Age of Onset  . Hypertension Mother   . Diabetes Sister   . Diabetes Brother     General ROS: denies cough, fever or chills. no LAN. not passing urine. normal BM. denies vision change. see HPI.  Blood pressure 155/75, pulse 99, temperature 98.4 F (36.9 C), temperature source Oral, resp. rate 19, height 5' 10" (1.778 m), weight 75.66 kg (166 lb 12.8 oz), SpO2 99 %. General appearance: alert, cooperative and no distress Eyes: negative findings: conjunctivae and sclerae normal and pupils equal, round, reactive to light and accomodation Throat: abnormal findings: dentition: poor Neck: no adenopathy and supple, symmetrical, trachea midline Lungs: clear to auscultation  bilaterally Heart: regular rate and rhythm Abdomen: normal findings: bowel sounds normal and soft, non-tender Extremities: edema none and no diabetic foot ulcers. decreased light touch.  Lymph nodes: Cervical, supraclavicular, and axillary nodes normal.   Results for orders placed or performed during the hospital encounter of 11/30/14 (from the past 48 hour(s))  Urinalysis, Routine w reflex microscopic-may I&O cath if menses (not at [facility])     Status: Abnormal   Collection Time: 11/30/14  6:11 PM  Result Value Ref Range   Color, Urine YELLOW YELLOW   APPearance CLEAR CLEAR   Specific Gravity, Urine 1.025 1.005 - 1.030   pH 5.5 5.0 - 8.0   Glucose, UA >1000 (A) NEGATIVE mg/dL   Hgb urine dipstick TRACE (A) NEGATIVE   Bilirubin Urine NEGATIVE NEGATIVE   Ketones, ur NEGATIVE NEGATIVE mg/dL   Protein, ur NEGATIVE NEGATIVE mg/dL   Urobilinogen, UA 0.2 0.0 - 1.0 mg/dL   Nitrite NEGATIVE NEGATIVE   Leukocytes, UA NEGATIVE NEGATIVE  Urine microscopic-add on     Status: None   Collection Time: 11/30/14  6:11 PM  Result Value Ref Range   WBC, UA 3-6 <3 WBC/hpf   RBC / HPF 0-2 <3 RBC/hpf   Bacteria, UA RARE RARE   Urine-Other MUCOUS PRESENT     Comment: RARE YEAST  CBG monitoring, ED     Status: Abnormal   Collection Time: 11/30/14  6:16 PM  Result Value Ref Range   Glucose-Capillary 421 (H) 65 - 99 mg/dL  CBG monitoring, ED     Status: Abnormal   Collection Time: 11/30/14  7:37 PM  Result Value Ref Range   Glucose-Capillary 437 (H) 65 - 99 mg/dL  CBC     Status: Abnormal   Collection Time: 11/30/14  7:52 PM  Result Value Ref Range   WBC 12.0 (H) 4.0 - 10.5 K/uL   RBC 3.43 (L) 4.22 - 5.81 MIL/uL   Hemoglobin 9.4 (L) 13.0 - 17.0 g/dL   HCT 28.6 (L) 39.0 - 52.0 %   MCV 83.4 78.0 - 100.0 fL   MCH 27.4 26.0 - 34.0 pg   MCHC 32.9 30.0 - 36.0 g/dL   RDW 15.2 11.5 - 15.5 %   Platelets 381 150 - 400 K/uL  I-Stat CG4 Lactic Acid, ED     Status: Abnormal   Collection Time: 11/30/14   8:31 PM  Result Value Ref Range   Lactic Acid, Venous 2.67 (HH) 0.5 - 2.0 mmol/L   Comment NOTIFIED PHYSICIAN   Basic metabolic panel     Status: Abnormal   Collection Time: 11/30/14  8:56 PM  Result Value Ref Range   Sodium 134 (L) 135 - 145 mmol/L   Potassium 4.1 3.5 - 5.1 mmol/L   Chloride 96 (L) 101 - 111 mmol/L     CO2 29 22 - 32 mmol/L   Glucose, Bld 390 (H) 65 - 99 mg/dL   BUN 18 6 - 20 mg/dL   Creatinine, Ser 1.08 0.61 - 1.24 mg/dL   Calcium 8.8 (L) 8.9 - 10.3 mg/dL   GFR calc non Af Amer >60 >60 mL/min   GFR calc Af Amer >60 >60 mL/min    Comment: (NOTE) The eGFR has been calculated using the CKD EPI equation. This calculation has not been validated in all clinical situations. eGFR's persistently <60 mL/min signify possible Chronic Kidney Disease.    Anion gap 9 5 - 15  Blood culture (routine x 2)     Status: None (Preliminary result)   Collection Time: 11/30/14  8:56 PM  Result Value Ref Range   Specimen Description BLOOD RIGHT HAND    Special Requests BOTTLES DRAWN AEROBIC AND ANAEROBIC 10CC EA    Culture      NO GROWTH < 24 HOURS Performed at [facility]    Report Status PENDING   Procalcitonin - Baseline     Status: None   Collection Time: 11/30/14  8:56 PM  Result Value Ref Range   Procalcitonin 0.22 ng/mL    Comment:        Interpretation: PCT (Procalcitonin) <= 0.5 ng/mL: Systemic infection (sepsis) is not likely. Local bacterial infection is possible. (NOTE)         ICU PCT Algorithm               Non ICU PCT Algorithm    ----------------------------     ------------------------------         PCT < 0.25 ng/mL                 PCT < 0.1 ng/mL     Stopping of antibiotics            Stopping of antibiotics       strongly encouraged.               strongly encouraged.    ----------------------------     ------------------------------       PCT level decrease by               PCT < 0.25 ng/mL       >= 80% from peak PCT       OR PCT 0.25 - 0.5 ng/mL           Stopping of antibiotics                                             encouraged.     Stopping of antibiotics           encouraged.    ----------------------------     ------------------------------       PCT level decrease by              PCT >= 0.25 ng/mL       < 80% from peak PCT        AND PCT >= 0.5 ng/mL            Continuin g antibiotics                                                encouraged.       Continuing antibiotics            encouraged.    ----------------------------     ------------------------------     PCT level increase compared          PCT > 0.5 ng/mL         with peak PCT AND          PCT >= 0.5 ng/mL             Escalation of antibiotics                                          strongly encouraged.      Escalation of antibiotics        strongly encouraged.   Blood culture (routine x 2)     Status: None (Preliminary result)   Collection Time: 11/30/14  9:19 PM  Result Value Ref Range   Specimen Description BLOOD LEFT ANTECUBITAL    Special Requests BOTTLES DRAWN AEROBIC AND ANAEROBIC 5CC EA    Culture      NO GROWTH < 24 HOURS Performed at [facility]    Report Status PENDING   I-stat chem 8, ed     Status: Abnormal   Collection Time: 11/30/14  9:29 PM  Result Value Ref Range   Sodium 131 (L) 135 - 145 mmol/L   Potassium 4.7 3.5 - 5.1 mmol/L   Chloride 94 (L) 101 - 111 mmol/L   BUN 23 (H) 6 - 20 mg/dL   Creatinine, Ser 1.00 0.61 - 1.24 mg/dL   Glucose, Bld 551 (HH) 65 - 99 mg/dL   Calcium, Ion 1.17 1.12 - 1.23 mmol/L   TCO2 30 0 - 100 mmol/L   Hemoglobin 10.9 (L) 13.0 - 17.0 g/dL   HCT 32.0 (L) 39.0 - 52.0 %   Comment NOTIFIED PHYSICIAN   CBG monitoring, ED     Status: Abnormal   Collection Time: 11/30/14 10:47 PM  Result Value Ref Range   Glucose-Capillary 208 (H) 65 - 99 mg/dL  Procalcitonin     Status: None   Collection Time: 12/01/14 12:49 AM  Result Value Ref Range   Procalcitonin 0.18 ng/mL    Comment:          Interpretation: PCT (Procalcitonin) <= 0.5 ng/mL: Systemic infection (sepsis) is not likely. Local bacterial infection is possible. (NOTE)         ICU PCT Algorithm               Non ICU PCT Algorithm    ----------------------------     ------------------------------         PCT < 0.25 ng/mL                 PCT < 0.1 ng/mL     Stopping of antibiotics            Stopping of antibiotics       strongly encouraged.               strongly encouraged.    ----------------------------     ------------------------------       PCT level decrease by               PCT < 0.25 ng/mL       >= 80% from peak PCT       OR   PCT 0.25 - 0.5 ng/mL          Stopping of antibiotics                                             encouraged.     Stopping of antibiotics           encouraged.    ----------------------------     ------------------------------       PCT level decrease by              PCT >= 0.25 ng/mL       < 80% from peak PCT        AND PCT >= 0.5 ng/mL            Continuin g antibiotics                                              encouraged.       Continuing antibiotics            encouraged.    ----------------------------     ------------------------------     PCT level increase compared          PCT > 0.5 ng/mL         with peak PCT AND          PCT >= 0.5 ng/mL             Escalation of antibiotics                                          strongly encouraged.      Escalation of antibiotics        strongly encouraged.   Protime-INR     Status: None   Collection Time: 12/01/14 12:49 AM  Result Value Ref Range   Prothrombin Time 14.9 11.6 - 15.2 seconds   INR 1.15 0.00 - 1.49  APTT     Status: Abnormal   Collection Time: 12/01/14 12:49 AM  Result Value Ref Range   aPTT 38 (H) 24 - 37 seconds    Comment:        IF BASELINE aPTT IS ELEVATED, SUGGEST PATIENT RISK ASSESSMENT BE USED TO DETERMINE APPROPRIATE ANTICOAGULANT THERAPY.   Type and screen     Status: None   Collection Time:  12/01/14 12:49 AM  Result Value Ref Range   ABO/RH(D) A POS    Antibody Screen NEG    Sample Expiration 12/04/2014   Glucose, capillary     Status: Abnormal   Collection Time: 12/01/14  1:07 AM  Result Value Ref Range   Glucose-Capillary 324 (H) 65 - 99 mg/dL  Comprehensive metabolic panel     Status: Abnormal   Collection Time: 12/01/14  1:17 AM  Result Value Ref Range   Sodium 136 135 - 145 mmol/L   Potassium 3.6 3.5 - 5.1 mmol/L    Comment: DELTA CHECK NOTED REPEATED TO VERIFY    Chloride 102 101 - 111 mmol/L   CO2 28 22 - 32 mmol/L   Glucose, Bld 338 (H) 65 - 99 mg/dL   BUN 15 6 - 20 mg/dL     Creatinine, Ser 0.91 0.61 - 1.24 mg/dL   Calcium 8.0 (L) 8.9 - 10.3 mg/dL   Total Protein 6.9 6.5 - 8.1 g/dL   Albumin 2.1 (L) 3.5 - 5.0 g/dL   AST 21 15 - 41 U/L   ALT 38 17 - 63 U/L   Alkaline Phosphatase 187 (H) 38 - 126 U/L   Total Bilirubin 0.7 0.3 - 1.2 mg/dL   GFR calc non Af Amer >60 >60 mL/min   GFR calc Af Amer >60 >60 mL/min    Comment: (NOTE) The eGFR has been calculated using the CKD EPI equation. This calculation has not been validated in all clinical situations. eGFR's persistently <60 mL/min signify possible Chronic Kidney Disease.    Anion gap 6 5 - 15  CBC with Differential/Platelet     Status: Abnormal   Collection Time: 12/01/14  1:17 AM  Result Value Ref Range   WBC 10.7 (H) 4.0 - 10.5 K/uL   RBC 3.37 (L) 4.22 - 5.81 MIL/uL   Hemoglobin 8.8 (L) 13.0 - 17.0 g/dL   HCT 28.1 (L) 39.0 - 52.0 %   MCV 83.4 78.0 - 100.0 fL   MCH 26.1 26.0 - 34.0 pg   MCHC 31.3 30.0 - 36.0 g/dL   RDW 14.9 11.5 - 15.5 %   Platelets 379 150 - 400 K/uL   Neutrophils Relative % 81 (H) 43 - 77 %   Neutro Abs 8.7 (H) 1.7 - 7.7 K/uL   Lymphocytes Relative 12 12 - 46 %   Lymphs Abs 1.2 0.7 - 4.0 K/uL   Monocytes Relative 6 3 - 12 %   Monocytes Absolute 0.7 0.1 - 1.0 K/uL   Eosinophils Relative 1 0 - 5 %   Eosinophils Absolute 0.1 0.0 - 0.7 K/uL   Basophils Relative 0 0 - 1 %    Basophils Absolute 0.0 0.0 - 0.1 K/uL  Lactic acid, plasma     Status: None   Collection Time: 12/01/14  1:17 AM  Result Value Ref Range   Lactic Acid, Venous 1.2 0.5 - 2.0 mmol/L  Sedimentation rate     Status: Abnormal   Collection Time: 12/01/14  1:17 AM  Result Value Ref Range   Sed Rate 92 (H) 0 - 16 mm/hr  ABO/Rh     Status: None   Collection Time: 12/01/14  1:17 AM  Result Value Ref Range   ABO/RH(D) A POS   Glucose, capillary     Status: Abnormal   Collection Time: 12/01/14  6:24 AM  Result Value Ref Range   Glucose-Capillary 253 (H) 65 - 99 mg/dL   Comment 1 Notify RN    Comment 2 Document in Chart   Glucose, capillary     Status: Abnormal   Collection Time: 12/01/14 11:35 AM  Result Value Ref Range   Glucose-Capillary 236 (H) 65 - 99 mg/dL      Component Value Date/Time   SDES BLOOD LEFT ANTECUBITAL 11/30/2014 2119   SPECREQUEST BOTTLES DRAWN AEROBIC AND ANAEROBIC 5CC EA 11/30/2014 2119   CULT  11/30/2014 2119    NO GROWTH < 24 HOURS Performed at [facility]    REPTSTATUS PENDING 11/30/2014 2119   Dg Chest 2 View  11/30/2014   CLINICAL DATA:  Recently hospitalized with urinary tract infection. Fever. History of diabetes. Initial encounter.  EXAM: CHEST  2 VIEW  COMPARISON:  11/21/2014 and 11/18/2014 radiographs.  FINDINGS: The heart size and mediastinal contours are stable without definite adenopathy. There is overall   improved aeration of the left lung base. However, the patient has developed ill-defined nodular densities in both lungs worrisome for possible septic emboli. No significant pleural effusion. The bones appear unchanged.  IMPRESSION: Interval increased nodularity to the previously demonstrated bilateral airspace opacities worrisome for developing septic emboli. No significant pleural effusion.  Further evaluation with chest CT recommended (preferably with contrast if possible).   Electronically Signed   By: Richardean Sale M.D.   On: 11/30/2014 21:16     Ct Chest W Contrast  11/30/2014   CLINICAL DATA:  Recent diagnosis of urinary tract infection 2 weeks ago. Now developing right lateral chest pain since this morning. Plain radiographs demonstrated increase nodular infiltrates.  EXAM: CT CHEST WITH CONTRAST  TECHNIQUE: Multidetector CT imaging of the chest was performed during intravenous contrast administration.  CONTRAST:  90m OMNIPAQUE IOHEXOL 300 MG/ML  SOLN  COMPARISON:  Chest 11/30/2014  FINDINGS: Multiple diffuse nodular areas of consolidation throughout both lungs, many with cavitation. Largest lesion is present in the superior segment left lower lobe and measures about 1.8 x 2.3 cm. Appearance is compatible with septic emboli although nonspecific. Short-term follow-up after resolution of acute process is recommended to exclude metastasis. Differential diagnosis would also include Wegner's granulomatosis or atypical infection such as TB or fungal infection. Small right pleural effusion. No pneumothorax.  Normal heart size. Normal caliber thoracic aorta. Scattered mediastinal and hilar lymph nodes as well as axillary lymph nodes are present without pathologic enlargement. These are likely reactive. Small esophageal hiatal hernia. Esophagus is mostly decompressed.  Included portions of the upper abdominal organs are grossly unremarkable. No destructive bone lesions.  IMPRESSION: Multiple pulmonary nodules bilaterally, many with cavitation. Appearance is compatible with septic emboli although differential diagnosis would include metastasis, TB or fungal infection. Small right pleural effusion.   Electronically Signed   By: WLucienne CapersM.D.   On: 11/30/2014 22:45   Recent Results (from the past 240 hour(s))  Blood culture (routine x 2)     Status: None (Preliminary result)   Collection Time: 11/30/14  8:56 PM  Result Value Ref Range Status   Specimen Description BLOOD RIGHT HAND  Final   Special Requests BOTTLES DRAWN AEROBIC AND ANAEROBIC 10CC  EA  Final   Culture   Final    NO GROWTH < 24 HOURS [facility]   Report Status PENDING  Incomplete  Blood culture (routine x 2)     Status: None (Preliminary result)   Collection Time: 11/30/14  9:19 PM  Result Value Ref Range Status   Specimen Description BLOOD LEFT ANTECUBITAL  Final   Special Requests BOTTLES DRAWN AEROBIC AND ANAEROBIC 5CC EA  Final   Culture   Final    NO GROWTH < 24 HOURS [facility]   Report Status PENDING  Incomplete      12/01/2014, 4:36 PM     LOS: 1 day

## 2014-12-01 NOTE — Progress Notes (Signed)
ANTIBIOTIC CONSULT NOTE - FOLLOW UP  Pharmacy Consult for Vancomycin, Zosyn Indication: sepsis, MRSA bacteremia, pulmonary infection  No Known Allergies  Patient Measurements: Height:  (177.8 cm) Weight: 166 lb 12.8 oz (75.66 kg) IBW/kg (Calculated) : 73 Adjusted Body Weight:   Vital Signs: Temp: 97.8 F (36.6 C) (07/12 0455) Temp Source: Oral (07/12 0455) BP: 138/76 mmHg (07/12 0455) Pulse Rate: 96 (07/12 0455) Intake/Output from previous day: 07/11 0701 - 07/12 0700 In: 1023.8 [P.O.:480; I.V.:493.8; IV Piggyback:50] Out: 1860 [Urine:1860] Intake/Output from this shift:    Labs:  Recent Labs  11/30/14 1952 11/30/14 2056 11/30/14 2129 12/01/14 0117  WBC 12.0*  --   --  10.7*  HGB 9.4*  --  10.9* 8.8*  PLT 381  --   --  379  CREATININE  --  1.08 1.00 0.91   Estimated Creatinine Clearance: 98 mL/min (by C-G formula based on Cr of 0.91). No results for input(s): VANCOTROUGH, VANCOPEAK, VANCORANDOM, GENTTROUGH, GENTPEAK, GENTRANDOM, TOBRATROUGH, TOBRAPEAK, TOBRARND, AMIKACINPEAK, AMIKACINTROU, AMIKACIN in the last 72 hours.   Microbiology: Recent Results (from the past 720 hour(s))  Urine culture     Status: None   Collection Time: 11/16/14 12:11 PM  Result Value Ref Range Status   Specimen Description URINE, CLEAN CATCH  Final   Special Requests NONE  Final   Culture   Final    MULTIPLE SPECIES PRESENT, SUGGEST RECOLLECTION IF CLINICALLY INDICATED Performed at [facility]    Report Status 11/18/2014 FINAL  Final  Culture, blood (routine x 2) Call MD if unable to obtain prior to antibiotics being given     Status: None   Collection Time: 11/21/14 10:41 AM  Result Value Ref Range Status   Specimen Description BLOOD ARM LEFT  Final   Special Requests   Final    BOTTLES DRAWN AEROBIC AND ANAEROBIC 10CC BOTH BOTTLES   Culture  Setup Time   Final    GRAM POSITIVE COCCI IN CLUSTERS Gram Stain Report Called to,Read Back By and Verified With: M. Long RN  17:50 11/24/14 ([email or web address]) ANAEROBIC BOTTLE ONLY    Culture   Final    METHICILLIN RESISTANT STAPHYLOCOCCUS AUREUS Performed at [facility]    Report Status 11/27/2014 FINAL  Final   Organism ID, Bacteria METHICILLIN RESISTANT STAPHYLOCOCCUS AUREUS  Final      Susceptibility   Methicillin resistant staphylococcus aureus - MIC*    CIPROFLOXACIN >=8 RESISTANT Resistant     ERYTHROMYCIN >=8 RESISTANT Resistant     GENTAMICIN <=0.5 SENSITIVE Sensitive     OXACILLIN >=4 RESISTANT Resistant     TETRACYCLINE <=1 SENSITIVE Sensitive     VANCOMYCIN 1 SENSITIVE Sensitive     TRIMETH/SULFA <=10 SENSITIVE Sensitive     CLINDAMYCIN <=0.25 SENSITIVE Sensitive     RIFAMPIN <=0.5 SENSITIVE Sensitive     Inducible Clindamycin NEGATIVE Sensitive     * METHICILLIN RESISTANT STAPHYLOCOCCUS AUREUS  Culture, blood (routine x 2) Call MD if unable to obtain prior to antibiotics being given     Status: None   Collection Time: 11/21/14 10:49 AM  Result Value Ref Range Status   Specimen Description BLOOD RIGHT ARM  Final   Special Requests   Final    BOTTLES DRAWN AEROBIC AND ANAEROBIC 10CC BOTH BOTTLES   Culture  Setup Time   Final    GRAM POSITIVE COCCI IN CLUSTERS ANAEROBIC BOTTLE ONLY CRITICAL RESULT CALLED TO, READ BACK BY AND VERIFIED WITH: A DEVINE,MD AT 1610 11/24/14 BY L  BENFIELD    Culture   Final    METHICILLIN RESISTANT STAPHYLOCOCCUS AUREUS SUSCEPTIBILITIES PERFORMED ON PREVIOUS CULTURE WITHIN THE LAST 5 DAYS. [facility]    Report Status 11/27/2014 FINAL  Final  Blood culture (routine x 2)     Status: None (Preliminary result)   Collection Time: 11/30/14  8:56 PM  Result Value Ref Range Status   Specimen Description BLOOD RIGHT HAND  Final   Special Requests BOTTLES DRAWN AEROBIC AND ANAEROBIC 10CC EA  Final   Culture PENDING  Incomplete   Report Status PENDING  Incomplete  Blood culture (routine x 2)     Status: None (Preliminary result)   Collection Time:  11/30/14  9:19 PM  Result Value Ref Range Status   Specimen Description BLOOD LEFT ANTECUBITAL  Final   Special Requests BOTTLES DRAWN AEROBIC AND ANAEROBIC 5CC EA  Final   Culture PENDING  Incomplete   Report Status PENDING  Incomplete    Anti-infectives    Start     Dose/Rate Route Frequency Ordered Stop   12/01/14 0600  vancomycin (VANCOCIN) IVPB 1000 mg/200 mL premix     1,000 mg 200 mL/hr over 60 Minutes Intravenous Every 8 hours 11/30/14 2057     12/01/14 0100  piperacillin-tazobactam (ZOSYN) IVPB 3.375 g     3.375 g 12.5 mL/hr over 240 Minutes Intravenous 3 times per day 12/01/14 0051     11/30/14 2045  vancomycin (VANCOCIN) 1,500 mg in sodium chloride 0.9 % 500 mL IVPB     1,500 mg 250 mL/hr over 120 Minutes Intravenous  Once 11/30/14 2026 11/30/14 2343      Assessment: 54 yoM admitted with sepsis, MRSA bacteremia, started on Vancomycin per pharmacy dosing.  CT chest also showing multiple nodules with cavitation so Zosyn added and patient on airborne precautions since TB in differential.  ID consulted.  7/11 >> vancomycin >> 7/12 >> zosyn >>  7/11 blood x 2: collected 7/11 urine: collected 7/2 blood x 2: 2/2 MRSA  7/12 Quantiferon tb gold: IP  WBC, LA, PCT improved today.  Currently afebrile.    Goal of Therapy:  Vancomycin trough level 15-20 mcg/ml  Doses adjusted per renal function Eradication of infection  Plan:  1.  Check vancomycin trough tonight. 2.  Continue Zosyn 3.375g IV q8h (4 hour infusion time).  3.  F/u ID work up and recommendations.  Clance Bollunyon, Endrit Gittins 12/01/2014,11:27 AM

## 2014-12-02 DIAGNOSIS — J189 Pneumonia, unspecified organism: Secondary | ICD-10-CM

## 2014-12-02 DIAGNOSIS — I76 Septic arterial embolism: Secondary | ICD-10-CM | POA: Insufficient documentation

## 2014-12-02 DIAGNOSIS — A4102 Sepsis due to Methicillin resistant Staphylococcus aureus: Principal | ICD-10-CM

## 2014-12-02 DIAGNOSIS — I10 Essential (primary) hypertension: Secondary | ICD-10-CM

## 2014-12-02 DIAGNOSIS — E1165 Type 2 diabetes mellitus with hyperglycemia: Secondary | ICD-10-CM

## 2014-12-02 DIAGNOSIS — J984 Other disorders of lung: Secondary | ICD-10-CM

## 2014-12-02 LAB — URINE CULTURE: Culture: NO GROWTH

## 2014-12-02 LAB — PROCALCITONIN: PROCALCITONIN: 0.14 ng/mL

## 2014-12-02 LAB — GLUCOSE, CAPILLARY
GLUCOSE-CAPILLARY: 69 mg/dL (ref 65–99)
GLUCOSE-CAPILLARY: 112 mg/dL — AB (ref 65–99)
Glucose-Capillary: 145 mg/dL — ABNORMAL HIGH (ref 65–99)
Glucose-Capillary: 167 mg/dL — ABNORMAL HIGH (ref 65–99)
Glucose-Capillary: 177 mg/dL — ABNORMAL HIGH (ref 65–99)

## 2014-12-02 NOTE — Progress Notes (Signed)
INFECTIOUS DISEASE PROGRESS NOTE  ID: Marc [name] is a [age] y.o. male with  Principal Problem:   Sepsis Active Problems:   Essential hypertension   MRSA bacteremia   Diabetes mellitus type 2, uncontrolled  Subjective: Feels great.   Abtx:  Anti-infectives    Start     Dose/Rate Route Frequency Ordered Stop   12/01/14 0600  vancomycin (VANCOCIN) IVPB 1000 mg/200 mL premix     1,000 mg 200 mL/hr over 60 Minutes Intravenous Every 8 hours 11/30/14 2057     12/01/14 0100  piperacillin-tazobactam (ZOSYN) IVPB 3.375 g  Status:  Discontinued     3.375 g 12.5 mL/hr over 240 Minutes Intravenous 3 times per day 12/01/14 0051 12/01/14 1701   11/30/14 2045  vancomycin (VANCOCIN) 1,500 mg in sodium chloride 0.9 % 500 mL IVPB     1,500 mg 250 mL/hr over 120 Minutes Intravenous  Once 11/30/14 2026 11/30/14 2343      Medications:  Scheduled: . amLODipine  10 mg Oral Daily  . enoxaparin (LOVENOX) injection  40 mg Subcutaneous Q24H  . folic acid  1 mg Oral Daily  . glimepiride  2 mg Oral Q breakfast  . insulin aspart  0-9 Units Subcutaneous TID WC  . insulin glargine  35 Units Subcutaneous QHS  . tamsulosin  0.4 mg Oral QPC supper  . thiamine  100 mg Oral Daily  . vancomycin  1,000 mg Intravenous Q8H    Objective: Vital signs in last 24 hours: Temp:  [98 F (36.7 C)-98.8 F (37.1 C)] 98.8 F (37.1 C) (07/13 1406) Pulse Rate:  [100-104] 100 (07/13 1406) Resp:  [18-20] 18 (07/13 1406) BP: (131-160)/(81-84) 131/81 mmHg (07/13 1406) SpO2:  [96 %-100 %] 100 % (07/13 1406) Weight:  [75.841 kg (167 lb 3.2 oz)] 75.841 kg (167 lb 3.2 oz) (07/13 0508)   General appearance: alert, cooperative and no distress Resp: clear to auscultation bilaterally Cardio: regular rate and rhythm GI: normal findings: bowel sounds normal and soft, non-tender  Lab Results  Recent Labs  11/30/14 1952  11/30/14 2056 11/30/14 2129 12/01/14 0117  WBC 12.0*  --   --   --  10.7*  HGB 9.4*  --    --  10.9* 8.8*  HCT 28.6*  --   --  32.0* 28.1*  NA  --   < > 134* 131* 136  K  --   < > 4.1 4.7 3.6  CL  --   < > 96* 94* 102  CO2  --   --  29  --  28  BUN  --   < > 18 23* 15  CREATININE  --   < > 1.08 1.00 0.91  < > = values in this interval not displayed. Liver Panel  Recent Labs  12/01/14 0117  PROT 6.9  ALBUMIN 2.1*  AST 21  ALT 38  ALKPHOS 187*  BILITOT 0.7   Sedimentation Rate  Recent Labs  12/01/14 0117  ESRSEDRATE 92*   C-Reactive Protein No results for input(s): CRP in the last 72 hours.  Microbiology: Recent Results (from the past 240 hour(s))  Blood culture (routine x 2)     Status: None (Preliminary result)   Collection Time: 11/30/14  8:56 PM  Result Value Ref Range Status   Specimen Description BLOOD RIGHT HAND  Final   Special Requests BOTTLES DRAWN AEROBIC AND ANAEROBIC 10CC EA  Final   Culture   Final    NO GROWTH 2 DAYS  Performed at [facility]    Report Status PENDING  Incomplete  Blood culture (routine x 2)     Status: None (Preliminary result)   Collection Time: 11/30/14  9:19 PM  Result Value Ref Range Status   Specimen Description BLOOD LEFT ANTECUBITAL  Final   Special Requests BOTTLES DRAWN AEROBIC AND ANAEROBIC 5CC EA  Final   Culture   Final    NO GROWTH 2 DAYS Performed at [facility]    Report Status PENDING  Incomplete  Urine culture     Status: None   Collection Time: 11/30/14  9:57 PM  Result Value Ref Range Status   Specimen Description URINE, CATHETERIZED  Final   Special Requests NONE  Final   Culture   Final    NO GROWTH 2 DAYS Performed at [facility]    Report Status 12/02/2014 FINAL  Final    Studies/Results: Dg Chest 2 View  11/30/2014   CLINICAL DATA:  Recently hospitalized with urinary tract infection. Fever. History of diabetes. Initial encounter.  EXAM: CHEST  2 VIEW  COMPARISON:  11/21/2014 and 11/18/2014 radiographs.  FINDINGS: The heart size and mediastinal contours are stable  without definite adenopathy. There is overall improved aeration of the left lung base. However, the patient has developed ill-defined nodular densities in both lungs worrisome for possible septic emboli. No significant pleural effusion. The bones appear unchanged.  IMPRESSION: Interval increased nodularity to the previously demonstrated bilateral airspace opacities worrisome for developing septic emboli. No significant pleural effusion.  Further evaluation with chest CT recommended (preferably with contrast if possible).   Electronically Signed   By: Carey Bullocks M.D.   On: 11/30/2014 21:16   Ct Chest W Contrast  11/30/2014   CLINICAL DATA:  Recent diagnosis of urinary tract infection 2 weeks ago. Now developing right lateral chest pain since this morning. Plain radiographs demonstrated increase nodular infiltrates.  EXAM: CT CHEST WITH CONTRAST  TECHNIQUE: Multidetector CT imaging of the chest was performed during intravenous contrast administration.  CONTRAST:  80mL OMNIPAQUE IOHEXOL 300 MG/ML  SOLN  COMPARISON:  Chest 11/30/2014  FINDINGS: Multiple diffuse nodular areas of consolidation throughout both lungs, many with cavitation. Largest lesion is present in the superior segment left lower lobe and measures about 1.8 x 2.3 cm. Appearance is compatible with septic emboli although nonspecific. Short-term follow-up after resolution of acute process is recommended to exclude metastasis. Differential diagnosis would also include Wegner's granulomatosis or atypical infection such as TB or fungal infection. Small right pleural effusion. No pneumothorax.  Normal heart size. Normal caliber thoracic aorta. Scattered mediastinal and hilar lymph nodes as well as axillary lymph nodes are present without pathologic enlargement. These are likely reactive. Small esophageal hiatal hernia. Esophagus is mostly decompressed.  Included portions of the upper abdominal organs are grossly unremarkable. No destructive bone  lesions.  IMPRESSION: Multiple pulmonary nodules bilaterally, many with cavitation. Appearance is compatible with septic emboli although differential diagnosis would include metastasis, TB or fungal infection. Small right pleural effusion.   Electronically Signed   By: Burman Nieves M.D.   On: 11/30/2014 22:45     Assessment/Plan: MRSA bacteremia Nodular, cavitary Pneumonia DM2 HTN Tobacco use ETOH use  Total days of antibiotics: 2 vanco    Await studies,  Needs TEE For BAL in AM, my great appreciation to Dr Aida Puffer Infectious Diseases (pager) [phone] [email or web address] 12/02/2014, 4:05 PM  LOS: 2 days

## 2014-12-02 NOTE — Consult Note (Signed)
Name: Marc [name] MRN: [id number] DOB: 06-May-1961    ADMISSION DATE:  11/30/2014 CONSULTATION DATE:  12/02/2014  REFERRING MD :  Cena Benton  CHIEF COMPLAINT:   Lung Cavitation  BRIEF PATIENT DESCRIPTION:54 yo male readmitted 7/11 for sepsis. Recently admitted 6/27-7/3 for urosepsis/pylo/obstruciton, DKA. Treated with Vanc and zosyn while inpatient and discharged home after insisting on leaving the hospital on levofloxacin. PCCM is consulted after CT scan reveals multiple bilateral pulmonary nodules many which have cavitation. He was in prison and homeless in 2013.  SIGNIFICANT EVENTS  7/11: Readmission with fever, tachycardia, MRSA positive cx  STUDIES:  7/11 CT Chest: Multiple diffuse nodular areas of consolidation throughout both lungs, many with cavitation. Largest lesion is present in the superior segment left lower lobe and measures about 1.8 x 2.3 cm. Appearance is compatible with septic emboli although nonspecific. Short-term follow-up after resolution of acute process is recommended to exclude metastasis. Differential diagnosis would also include Wegner's granulomatosis or atypical infection such as TB or fungal infection. Small right pleural effusion. No pneumothorax.  7/12 Transthoracic Echo:  There was no evidence of a vegetation Left ventricle estimated ejection fraction was in the range of 55% to 60%. Wall motion was normal; there were no regional wall motionabnormalities.Atrial septum: No defect or patent foramen ovale was identified.   HISTORY OF PRESENT ILLNESS:  Marc [name] is a 54 yo male with PMH of HTN, T2DM (A1C:12), smoker admitted 6/27 to 7/3 for DKA and urosepsis/pyelonephritis, obstructive uropathy and RLL pneumonia. He was treated inpatient with Vanc and zosyn however insisted on going home and left the hospital on a 7 day regimen of levaquin. On 7/6 he was contacted via phone by the hospitalist for a direct admit d/t MRSA positive cultures but declined  to be admitted at that time. On 7/11 he presented the ED febrile and tachycardic. His CXR on was concerning for septic emboli and his chest CT on 7/11 revealed multiple pulmonary nodules bilaterally, many which have cavitation. His echocardiogram 7/12 reveals normal EF of 55-60 percent with no evidence of vegetation. Agree with ID, TEE would be the test of choice for evaluation of endocarditis. PCCM was consulted for management of lung nodules and cavitation. ID also following.    PAST MEDICAL HISTORY :   has a past medical history of Diabetes mellitus without complication and Hypertension.  has past surgical history that includes No past surgeries. Prior to Admission medications   Medication Sig Start Date End Date Taking? Authorizing Provider  acetaminophen (TYLENOL) 500 MG tablet Take 500 mg by mouth every 6 (six) hours as needed for mild pain.   Yes Historical Provider, MD  levofloxacin (LEVAQUIN) 500 MG tablet Take 1 tablet (500 mg total) by mouth daily. 11/22/14  Yes Alison Murray, MD  tamsulosin (FLOMAX) 0.4 MG CAPS capsule Take 1 capsule (0.4 mg total) by mouth daily after supper. 11/22/14  Yes Alison Murray, MD  alum & mag hydroxide-simeth (MAALOX/MYLANTA) 200-200-20 MG/5ML suspension Take 30 mLs by mouth every 6 (six) hours as needed for indigestion or heartburn. Patient not taking: Reported on 11/30/2014 11/22/14   Alison Murray, MD  amLODipine (NORVASC) 10 MG tablet Take 1 tablet (10 mg total) by mouth daily. Patient not taking: Reported on 11/30/2014 11/22/14   Alison Murray, MD  folic acid (FOLVITE) 1 MG tablet Take 1 tablet (1 mg total) by mouth daily. Patient not taking: Reported on 11/30/2014 11/22/14   Alison Murray, MD  glimepiride [facility]) 2  MG tablet Take 1 tablet (2 mg total) by mouth daily with breakfast. Patient not taking: Reported on 11/30/2014 11/22/14   Alison Murray, MD  glucose blood test strip Use as instructed Patient not taking: Reported on 11/30/2014 11/22/14   Alison Murray, MD    insulin aspart (NOVOLOG) 100 UNIT/ML injection Inject 7 Units into the skin 3 (three) times daily with meals. Patient not taking: Reported on 11/30/2014 11/22/14   Alison Murray, MD  insulin glargine (LANTUS) 100 UNIT/ML injection Inject 0.35 mLs (35 Units total) into the skin at bedtime. Patient not taking: Reported on 11/30/2014 11/22/14   Alison Murray, MD  Lancets (ACCU-CHEK SOFT [facility]) lancets Use as instructed Patient not taking: Reported on 11/30/2014 11/22/14   Alison Murray, MD  Multiple Vitamin (MULTIVITAMIN WITH MINERALS) TABS tablet Take 1 tablet by mouth daily. Patient not taking: Reported on 11/30/2014 11/22/14   Alison Murray, MD  pantoprazole (PROTONIX) 40 MG tablet Take 1 tablet (40 mg total) by mouth daily. Patient not taking: Reported on 11/30/2014 11/22/14   Alison Murray, MD  potassium chloride SA (K-DUR,KLOR-CON) 20 MEQ tablet Take 1 tablet (20 mEq total) by mouth daily. Patient not taking: Reported on 11/30/2014 11/22/14   Alison Murray, MD  thiamine 100 MG tablet Take 1 tablet (100 mg total) by mouth daily. Patient not taking: Reported on 11/30/2014 11/22/14   Alison Murray, MD   No Known Allergies  FAMILY HISTORY:  family history includes Diabetes in his brother and sister; Hypertension in his mother. SOCIAL HISTORY:  reports that he has never smoked. He has never used smokeless tobacco. He reports that he drinks alcohol. He reports that he does not use illicit drugs.  REVIEW OF SYSTEMS:   Constitutional: Negative for fever, chills, weight loss, malaise/fatigue and diaphoresis.  HENT: Negative for hearing loss, ear pain, nosebleeds, congestion, sore throat, neck pain, tinnitus and ear discharge.   Eyes: Negative for blurred vision, double vision, photophobia, pain, discharge and redness.  Respiratory: Negative for cough, hemoptysis, sputum production, shortness of breath, wheezing and stridor.   Cardiovascular: Negative for chest pain, palpitations, orthopnea, claudication, leg  swelling and PND.  Gastrointestinal: Negative for heartburn, nausea, vomiting, abdominal pain, diarrhea, constipation, blood in stool and melena.  Genitourinary: Negative for dysuria, urgency, frequency, hematuria and flank pain.  Musculoskeletal: Negative for myalgias, back pain, joint pain and falls.  Skin: Negative for itching and rash.  Neurological: Negative for dizziness, tingling, tremors, sensory change, speech change, focal weakness, seizures, loss of consciousness, weakness and headaches.  Endo/Heme/Allergies: Negative for environmental allergies and polydipsia. Does not bruise/bleed easily.  SUBJECTIVE:  No distress  VITAL SIGNS: Temp:  [98 F (36.7 C)-98.8 F (37.1 C)] 98.8 F (37.1 C) (07/13 0508) Pulse Rate:  [99-104] 102 (07/13 0508) Resp:  [18-20] 20 (07/13 0508) BP: (145-160)/(75-84) 145/83 mmHg (07/13 0508) SpO2:  [96 %-99 %] 99 % (07/13 0508) Weight:  [75.841 kg (167 lb 3.2 oz)] 75.841 kg (167 lb 3.2 oz) (07/13 0508) Room air  PHYSICAL EXAMINATION: General: Male sitting up in bed in NAD Neuro:  Alert and oriented. CN II-XII grossly intact HEENT:  PERRL 3mm brisk, poor dentition. No lymphadenopathy Cardiovascular: rrr Lungs:  Clear  Abdomen:  Soft, nontender, nondistened Musculoskeletal:  intact Skin:  No rashes or lesions   Recent Labs Lab 11/30/14 2056 11/30/14 2129 12/01/14 0117  NA 134* 131* 136  K 4.1 4.7 3.6  CL 96* 94* 102  CO2 29  --  28  BUN 18 23* 15  CREATININE 1.08 1.00 0.91  GLUCOSE 390* 551* 338*    Recent Labs Lab 11/30/14 1952 11/30/14 2129 12/01/14 0117  HGB 9.4* 10.9* 8.8*  HCT 28.6* 32.0* 28.1*  WBC 12.0*  --  10.7*  PLT 381  --  379   Dg Chest 2 View  11/30/2014   CLINICAL DATA:  Recently hospitalized with urinary tract infection. Fever. History of diabetes. Initial encounter.  EXAM: CHEST  2 VIEW  COMPARISON:  11/21/2014 and 11/18/2014 radiographs.  FINDINGS: The heart size and mediastinal contours are stable without  definite adenopathy. There is overall improved aeration of the left lung base. However, the patient has developed ill-defined nodular densities in both lungs worrisome for possible septic emboli. No significant pleural effusion. The bones appear unchanged.  IMPRESSION: Interval increased nodularity to the previously demonstrated bilateral airspace opacities worrisome for developing septic emboli. No significant pleural effusion.  Further evaluation with chest CT recommended (preferably with contrast if possible).   Electronically Signed   By: Carey BullocksWilliam  Veazey M.D.   On: 11/30/2014 21:16   Ct Chest W Contrast  11/30/2014   CLINICAL DATA:  Recent diagnosis of urinary tract infection 2 weeks ago. Now developing right lateral chest pain since this morning. Plain radiographs demonstrated increase nodular infiltrates.  EXAM: CT CHEST WITH CONTRAST  TECHNIQUE: Multidetector CT imaging of the chest was performed during intravenous contrast administration.  CONTRAST:  80mL OMNIPAQUE IOHEXOL 300 MG/ML  SOLN  COMPARISON:  Chest 11/30/2014  FINDINGS: Multiple diffuse nodular areas of consolidation throughout both lungs, many with cavitation. Largest lesion is present in the superior segment left lower lobe and measures about 1.8 x 2.3 cm. Appearance is compatible with septic emboli although nonspecific. Short-term follow-up after resolution of acute process is recommended to exclude metastasis. Differential diagnosis would also include Wegner's granulomatosis or atypical infection such as TB or fungal infection. Small right pleural effusion. No pneumothorax.  Normal heart size. Normal caliber thoracic aorta. Scattered mediastinal and hilar lymph nodes as well as axillary lymph nodes are present without pathologic enlargement. These are likely reactive. Small esophageal hiatal hernia. Esophagus is mostly decompressed.  Included portions of the upper abdominal organs are grossly unremarkable. No destructive bone lesions.   IMPRESSION: Multiple pulmonary nodules bilaterally, many with cavitation. Appearance is compatible with septic emboli although differential diagnosis would include metastasis, TB or fungal infection. Small right pleural effusion.   Electronically Signed   By: Burman NievesWilliam  Stevens M.D.   On: 11/30/2014 22:45    Pulmonary nodules/cavitation - most likely due to septic emboli w/ known h/o MRSA bacteremia. Echo was neg for veges via TTE. Does have risk factors for Tb (prior incarceration and homeless), however this is more likely due to the above.  Plan - Bronch and obtain BAL to ck AFB (scheduled for 9 am) - f/u  cultures -continue abx therapy per ID recommendations  DM HTN -per the primary team  12/02/2014, 11:20 AM  Reviewed above, examined.  54 yo male with hx of DM was in hospital recently with urosepsis, DKA, and also had multiple skin lesions.  He was found to have MRSA bacteremia and cavitary nodules on CT chest concerning for septic emboli.  He was in prison in 2013, so there is also concern he could have been exposed to TB and now has reactivation.  He has been on vancomycin therapy.  He feels better.  He is not having cough, sputum, chest pain, fever, or hemoptysis.  He  is alert, pleasant, MP 2, no LAN, heart rate regular, no wheeze, abd soft, no edema, multiple areas of healing wounds on his legs, foley in place.  CT chest shows b/l cavitary, nodular infiltrates.  Labs remarkable for anemia.  Have scheduled him for bronchoscopy for BAL to assess for AFBs.  This is set for 9 am on 12/03/14.  Explained the procedure.  Risks detailed as bleeding, infection, pneumothorax, and non diagnosis.  He is agreeable to procedure.  Coralyn Helling, MD [facility] Pulmonary/Critical Care 12/02/2014, 2:18 PM Pager:  [phone] After 3pm call: [phone]

## 2014-12-02 NOTE — Care Management Note (Signed)
Case Management Note  Patient Details  Name: Earnest Rosierrvin L Cegielski MRN: [id number] Date of Birth: 04-Feb-1961  Subjective/Objective:   Pt admitted with Sepsis                 Action/Plan:  From home   Expected Discharge Date:   (unknown)               Expected Discharge Plan:     In-House Referral:     Discharge planning Services  CM Consult, Follow-up appt scheduled  Post Acute Care Choice:    Choice offered to:     DME Arranged:    DME Agency:     HH Arranged:  RN HH Agency:  [facility]  Status of Service:  Completed, signed off  Medicare Important Message Given:    Date Medicare IM Given:    Medicare IM give by:    Date Additional Medicare IM Given:    Additional Medicare Important Message give by:     If discussed at Long Length of Stay Meetings, dates discussed:    Additional Comments: Pt has an appointment with [facility], Transitional Care 12/07/14 at 0900.  [facility].   Geni BersMcGibboney, Taner Rzepka, RN 12/02/2014, 2:44 PM

## 2014-12-02 NOTE — Progress Notes (Signed)
[facility]                                                                              Patient Demographics  Marc [name], is a 54 y.o. male, DOB - 1960-07-19, ION:[id number]  Admit date - 11/30/2014   Admitting Physician Marc ClosArshad N Kakrakandy, MD  Outpatient Primary MD for the patient is No PCP Per Patient  LOS - 2   Chief Complaint  Patient presents with  . Urinary Cath prob   . Blood Sugar Problem      HPI on 11/30/2014 by Dr. Midge MiniumArshad [name] Marc [name] is a 54 y.o. male with history of hypertension and diabetes mellitus type 2 who was admitted last week for sepsis secondary to UTI and pneumonia and was placed on Foley catheter for urinary obstruction presents to the ER because he wanted to have his catheter removed. During last admission patient's blood culture grew MRSA and hospitalist had called him directly for direct admission and patient did not come to the ER. In the ER today patient was found to be febrile and tachycardic. Chest x-ray shows possible septic emboli and CT chest shows multiple nodules with cavitation. Repeat blood cultures have been obtained and patient has been placed on empiric antibiotics and admitted for further management. On exam patient is asymptomatic and denies any chest pain shortness of breath productive cough but did complain of some subjective feeling of fever or chills. Denies any abdominal pain nausea vomiting or diarrhea. Patient's blood sugar is also found to be elevated but not in DKA. Patient has been noncompliant with his medications.   Assessment & Plan   Sepsis secondary to MRSA bacteremia -Blood cultures from 11/21/2014 show MRSA -Repeat cultures on 11/30/2014 negative to date -CT chest shows multiple pulmonary nodules bilaterally, many with cavitation, appearance compatible septic emboli, TB or fungal infection -Urine culture no growth to date -Leukocytosis improving, continues to have some tachycardia -Infectious  disease consulted and appreciated, concern for TB as patient does have risk factors -Echocardiogram: EF 55-60%, no defect or PFO noted, no vegetation -Spoke with Dr. Ninetta [name], infectious disease, who recommended BAL -Pulmonology consulted appreciated -Continue vancomycin, Zosyn was discontinued on 12/01/2014 -Suspect pulmonary nodule likely secondary to MRSA bacteremia -QuantiFERON TB cold pending  Diabetes mellitus, type II -Continue Lantus, Amaryl, insulin sliding scale with CBG monitoring  Hypertension -Continue amlodipine  Normocytic anemia -Baseline hemoglobin appears between 9 and 10, currently 8.8, drop likely dilutional -Continue to monitor CBC -Iron 19 -Will place patient on iron supplementation  Code Status: Full  Family Communication: None at bedside  Disposition Plan: Admitted  Time Spent in minutes   30 minutes  Procedures  Echocardiogram  Consults   Infectious disease Pulmonology   DVT Prophylaxis  Lovenox  Lab Results  Component Value Date   PLT 379 12/01/2014    Medications  Scheduled Meds: . amLODipine  10 mg Oral Daily  . enoxaparin (LOVENOX) injection  40 mg Subcutaneous Q24H  . folic acid  1 mg Oral Daily  . glimepiride  2 mg Oral Q breakfast  . insulin aspart  0-9 Units Subcutaneous TID WC  . insulin glargine  35 Units Subcutaneous QHS  .  tamsulosin  0.4 mg Oral QPC supper  . thiamine  100 mg Oral Daily  . vancomycin  1,000 mg Intravenous Q8H   Continuous Infusions:  PRN Meds:.acetaminophen **OR** acetaminophen, ondansetron **OR** ondansetron (ZOFRAN) IV  Antibiotics    Anti-infectives    Start     Dose/Rate Route Frequency Ordered Stop   12/01/14 0600  vancomycin (VANCOCIN) IVPB 1000 mg/200 mL premix     1,000 mg 200 mL/hr over 60 Minutes Intravenous Every 8 hours 11/30/14 2057     12/01/14 0100  piperacillin-tazobactam (ZOSYN) IVPB 3.375 g  Status:  Discontinued     3.375 g 12.5 mL/hr over 240 Minutes Intravenous 3 times per day  12/01/14 0051 12/01/14 1701   11/30/14 2045  vancomycin (VANCOCIN) 1,500 mg in sodium chloride 0.9 % 500 mL IVPB     1,500 mg 250 mL/hr over 120 Minutes Intravenous  Once 11/30/14 2026 11/30/14 2343      Subjective:   Marc [name] seen and examined today.  Patient complains of being admitted. He would like to be discharged to go home. He is very irate that he is still here. Does not understand why. Denies any chest pain, shortness of breath, weakness. States he feels fine.   Objective:   Filed Vitals:   12/01/14 1544 12/01/14 2057 12/02/14 0508 12/02/14 1406  BP: 155/75 160/84 145/83 131/81  Pulse: 99 104 102 100  Temp: 98.4 F (36.9 C) 98 F (36.7 C) 98.8 F (37.1 C) 98.8 F (37.1 C)  TempSrc: Oral Oral Oral Oral  Resp:  18 20 18   Height:      Weight:   75.841 kg (167 lb 3.2 oz)   SpO2: 99% 96% 99% 100%    Wt Readings from Last 3 Encounters:  12/02/14 75.841 kg (167 lb 3.2 oz)  11/16/14 86.3 kg (190 lb 4.1 oz)  10/05/14 89.132 kg (196 lb 8 oz)     Intake/Output Summary (Last 24 hours) at 12/02/14 1419 Last data filed at 12/02/14 1408  Gross per 24 hour  Intake   4690 ml  Output   6450 ml  Net  -1760 ml    Exam  General: Well developed, well nourished, NAD, appears stated age  HEENT: NCAT,  mucous membranes moist. Poor dentition   Cardiovascular: S1 S2 auscultated, no rubs, murmurs or gallops. Regular rate and rhythm.  Respiratory: Clear to auscultation bilaterally with equal chest rise  Abdomen: Soft, nontender, nondistended, + bowel sounds  Extremities: warm dry without cyanosis clubbing or edema  Neuro: AAOx3nonfocal   Psych: irate and angry   Data Review   Micro Results Recent Results (from the past 240 hour(s))  Blood culture (routine x 2)     Status: None (Preliminary result)   Collection Time: 11/30/14  8:56 PM  Result Value Ref Range Status   Specimen Description BLOOD RIGHT HAND  Final   Special Requests BOTTLES DRAWN AEROBIC AND  ANAEROBIC 10CC EA  Final   Culture   Final    NO GROWTH 2 DAYS Performed at [facility]    Report Status PENDING  Incomplete  Blood culture (routine x 2)     Status: None (Preliminary result)   Collection Time: 11/30/14  9:19 PM  Result Value Ref Range Status   Specimen Description BLOOD LEFT ANTECUBITAL  Final   Special Requests BOTTLES DRAWN AEROBIC AND ANAEROBIC 5CC EA  Final   Culture   Final    NO GROWTH 2 DAYS Performed at [facility]  Report Status PENDING  Incomplete  Urine culture     Status: None   Collection Time: 11/30/14  9:57 PM  Result Value Ref Range Status   Specimen Description URINE, CATHETERIZED  Final   Special Requests NONE  Final   Culture   Final    NO GROWTH 2 DAYS Performed at [facility]    Report Status 12/02/2014 FINAL  Final    Radiology Reports Dg Chest 2 View  11/30/2014   CLINICAL DATA:  Recently hospitalized with urinary tract infection. Fever. History of diabetes. Initial encounter.  EXAM: CHEST  2 VIEW  COMPARISON:  11/21/2014 and 11/18/2014 radiographs.  FINDINGS: The heart size and mediastinal contours are stable without definite adenopathy. There is overall improved aeration of the left lung base. However, the patient has developed ill-defined nodular densities in both lungs worrisome for possible septic emboli. No significant pleural effusion. The bones appear unchanged.  IMPRESSION: Interval increased nodularity to the previously demonstrated bilateral airspace opacities worrisome for developing septic emboli. No significant pleural effusion.  Further evaluation with chest CT recommended (preferably with contrast if possible).   Electronically Signed   By: Carey Bullocks M.D.   On: 11/30/2014 21:16   Ct Chest W Contrast  11/30/2014   CLINICAL DATA:  Recent diagnosis of urinary tract infection 2 weeks ago. Now developing right lateral chest pain since this morning. Plain radiographs demonstrated increase nodular  infiltrates.  EXAM: CT CHEST WITH CONTRAST  TECHNIQUE: Multidetector CT imaging of the chest was performed during intravenous contrast administration.  CONTRAST:  80mL OMNIPAQUE IOHEXOL 300 MG/ML  SOLN  COMPARISON:  Chest 11/30/2014  FINDINGS: Multiple diffuse nodular areas of consolidation throughout both lungs, many with cavitation. Largest lesion is present in the superior segment left lower lobe and measures about 1.8 x 2.3 cm. Appearance is compatible with septic emboli although nonspecific. Short-term follow-up after resolution of acute process is recommended to exclude metastasis. Differential diagnosis would also include Wegner's granulomatosis or atypical infection such as TB or fungal infection. Small right pleural effusion. No pneumothorax.  Normal heart size. Normal caliber thoracic aorta. Scattered mediastinal and hilar lymph nodes as well as axillary lymph nodes are present without pathologic enlargement. These are likely reactive. Small esophageal hiatal hernia. Esophagus is mostly decompressed.  Included portions of the upper abdominal organs are grossly unremarkable. No destructive bone lesions.  IMPRESSION: Multiple pulmonary nodules bilaterally, many with cavitation. Appearance is compatible with septic emboli although differential diagnosis would include metastasis, TB or fungal infection. Small right pleural effusion.   Electronically Signed   By: Burman Nieves M.D.   On: 11/30/2014 22:45   US Renal  11/17/2014   CLINICAL DATA:  Acute renal failure, history hypertension, diabetes  EXAM: RENAL / URINARY TRACT ULTRASOUND COMPLETE  COMPARISON:  None  FINDINGS: Right Kidney:  Length: 11.4 cm. Upper normal cortical echogenicity. Normal cortical thickness. Mild collecting system dilatation. No renal mass or shadowing calcification.  Left Kidney:  Length: 13.0 cm. Normal cortical thickness with slight fetal lobulation noted. Slightly increased cortical echogenicity. Mild collecting system  dilatation. No mass or shadowing calcification.  Bladder:  Distended, containing significant debris.  No gross/discrete mass.  IMPRESSION: Distended urinary bladder containing significant debris.  Mild BILATERAL renal collecting system dilatation though uncertain if this could be related to bladder distention.   Electronically Signed   By: Ulyses Southward M.D.   On: 11/17/2014 10:11   Dg Chest Port 1 View  11/21/2014   CLINICAL DATA:  Sepsis.  EXAM: PORTABLE CHEST - 1 VIEW  COMPARISON:  11/18/2014  FINDINGS: Heart size remains within normal limits. Prominent lung markings could represent edema. Again noted are patchy densities at the right lung base. Increased densities at the left lung base are concerning for pleural fluid and airspace disease. Concern for subtle airspace disease in left upper lung.  IMPRESSION: Increased densities at the left lung base likely represent pleural fluid and airspace disease.  Prominent lung markings with more confluent disease in the left upper lung and right lung base. Findings could represent asymmetric edema versus multi focal airspace disease.   Electronically Signed   By: Richarda Overlie M.D.   On: 11/21/2014 10:33   Dg Chest Port 1 View  11/18/2014   CLINICAL DATA:  Sepsis and weakness.  EXAM: PORTABLE CHEST - 1 VIEW  COMPARISON:  03/06/2014  FINDINGS: Subtle patchy densities at the right lung base. Heart size is normal. The trachea is midline. There is fullness in the mediastinum which could represent vascular congestion. Negative for a pneumothorax.  IMPRESSION: Patchy densities in the right lower lung with possible vascular congestion. Differential includes asymmetric pulmonary edema versus right lower lobe pneumonia/airspace disease. Recommend follow-up evaluation.   Electronically Signed   By: Richarda Overlie M.D.   On: 11/18/2014 15:26    CBC  Recent Labs Lab 11/30/14 1952 11/30/14 2129 12/01/14 0117  WBC 12.0*  --  10.7*  HGB 9.4* 10.9* 8.8*  HCT 28.6* 32.0* 28.1*    PLT 381  --  379  MCV 83.4  --  83.4  MCH 27.4  --  26.1  MCHC 32.9  --  31.3  RDW 15.2  --  14.9  LYMPHSABS  --   --  1.2  MONOABS  --   --  0.7  EOSABS  --   --  0.1  BASOSABS  --   --  0.0    Chemistries   Recent Labs Lab 11/30/14 2056 11/30/14 2129 12/01/14 0117  NA 134* 131* 136  K 4.1 4.7 3.6  CL 96* 94* 102  CO2 29  --  28  GLUCOSE 390* 551* 338*  BUN 18 23* 15  CREATININE 1.08 1.00 0.91  CALCIUM 8.8*  --  8.0*  AST  --   --  21  ALT  --   --  38  ALKPHOS  --   --  187*  BILITOT  --   --  0.7   ------------------------------------------------------------------------------------------------------------------ estimated creatinine clearance is 98 mL/min (by C-G formula based on Cr of 0.91). ------------------------------------------------------------------------------------------------------------------ No results for input(s): HGBA1C in the last 72 hours. ------------------------------------------------------------------------------------------------------------------ No results for input(s): CHOL, HDL, LDLCALC, TRIG, CHOLHDL, LDLDIRECT in the last 72 hours. ------------------------------------------------------------------------------------------------------------------ No results for input(s): TSH, T4TOTAL, T3FREE, THYROIDAB in the last 72 hours.  Invalid input(s): FREET3 ------------------------------------------------------------------------------------------------------------------ No results for input(s): VITAMINB12, FOLATE, FERRITIN, TIBC, IRON, RETICCTPCT in the last 72 hours.  Coagulation profile  Recent Labs Lab 12/01/14 0049  INR 1.15    No results for input(s): DDIMER in the last 72 hours.  Cardiac Enzymes No results for input(s): CKMB, TROPONINI, MYOGLOBIN in the last 168 hours.  Invalid input(s): CK ------------------------------------------------------------------------------------------------------------------ Invalid input(s):  POCBNP    Felicite Zeimet D.O. on 12/02/2014 at 2:19 PM  Between 7am to 7pm - Pager - [phone]  After 7pm go to [email or web address] - password [email or web address]  And look for the night coverage person covering for me after hours  [facility] Office  [phone]

## 2014-12-02 NOTE — Hospital Discharge Follow-Up (Signed)
[facility] Care Coordination Note:  Admit date:  11/30/2014 Discharge date: pending Discharge Disposition: plans to stay with his girlfriend. He is homeless. Patient contact:  He said to call the cell phone # for his friend, Leitha Bleak # [phone].  Emergency contact(s): Leitha Bleak  - number noted above.  He said that does not have any other contact numbers.  This Case Manager reviewed patient's EMR and determined patient would benefit from post-discharge medical management and chronic care management services through the [facility]. Patient has a history of HTN, DM, non compliance with his medications, tobacco and alcohol use.  He is currently admitted for treatment of sepsis due to MRSA bacteremia. The CT of his chest shows multiple pulmonary nodules many with cavitation.  Quantiferon TB results are pending.  He had been admitted to the hospital the week prior to this admission for treatment of sepsis secondary to UTI and pneumonia. A foley catheter was placed at that time for urinary obstruction. He was discharged on 11/25/14.  He was called to return to the hospital for re-admission due to + blood cultures but he did not return to the hospital until 11/30/2014.  He had 6 ED visits and 2 hospital admissions in the past 6 months. This Case Manager called the  patient to discuss the services and medical management that can be provided at the [facility]. Patient verbalized understanding and agreed to receive post-discharge care at the [facility].   Patient scheduled for Transitional Care appointment on December 07, 2014 at 0900.  Appointment information also placed on AVS.  Assessment:       Home Environment: He is currently homeless but will stay with his friend, Leitha Bleak temporarily in her apartment.        Support System: Leitha Bleak- friend, his mother, his sister       Level of functioning:independent             Home DME: none at  present time. He said that he needs a glucometer.       Home care services: (services arranged prior to discharge or new services after discharge)  He is currently receiving IV vancomycin and zosyn. [facility] will provide the IV            Meds/instructions at home.       Transportation: His friend will drive        Food/Nutrition: (ability to afford, access, use of any community resources) His friend and his mother provide him with food. He said that he has applied for food stamps and is waiting for confirmation that he has been approved.         Medications: (ability to afford, access, compliance, Pharmacy used) [facility].  He said that his mother and sister purchase his medications for him.         Identified Barriers: no income, no insurance, homeless, no medication coverage, waiting for confirmation of food stamps , no glucometer., currently has a foley catheter, possible transportation difficulties, non compliance w/ medication regime and blood glucose monitoring.         PCP (Name, office location, phone number): No PCP. Is interested in establishing care at [facility] after completing the Transitional Care program.   Patient Education: The services provided at the [facility] including pharmacy assistance, financial counseling, social work.  He said that he needs all of those services.  He said that he is aware of the services  provided by the [facility] and the [facility] ( [facility]) . He noted that he is planning to go to the [facility] on 7/15 to inquire about how they can help him.             Arranged services:        Services communicated to [employer] McGibboney, CM  This CM attempted to call him back multiple times this afternoon to [facility] but the phone in his room was busy each time CM called.

## 2014-12-03 ENCOUNTER — Inpatient Hospital Stay (HOSPITAL_COMMUNITY): Payer: Medicaid Other

## 2014-12-03 ENCOUNTER — Encounter (HOSPITAL_COMMUNITY)
Admission: EM | Disposition: A | Discharge status: Home Health | Payer: Self-pay | Source: Home / Self Care | Attending: Internal Medicine

## 2014-12-03 HISTORY — PX: VIDEO BRONCHOSCOPY: SHX5072

## 2014-12-03 LAB — HEPATITIS PANEL, ACUTE
HCV Ab: 0.2 s/co ratio (ref 0.0–0.9)
HEP B C IGM: NEGATIVE
HEP B S AG: NEGATIVE
Hep A IgM: NEGATIVE

## 2014-12-03 LAB — BASIC METABOLIC PANEL
Anion gap: 6 (ref 5–15)
BUN: 8 mg/dL (ref 6–20)
CALCIUM: 8.5 mg/dL — AB (ref 8.9–10.3)
CHLORIDE: 102 mmol/L (ref 101–111)
CO2: 29 mmol/L (ref 22–32)
Creatinine, Ser: 0.74 mg/dL (ref 0.61–1.24)
GFR calc non Af Amer: >60 mL/min (ref >60)
Glucose, Bld: 73 mg/dL (ref 65–99)
Potassium: 3.3 mmol/L — ABNORMAL LOW (ref 3.5–5.1)
SODIUM: 137 mmol/L (ref 135–145)

## 2014-12-03 LAB — GLUCOSE, CAPILLARY
GLUCOSE-CAPILLARY: 85 mg/dL (ref 65–99)
GLUCOSE-CAPILLARY: 106 mg/dL — AB (ref 65–99)
Glucose-Capillary: 82 mg/dL (ref 65–99)

## 2014-12-03 LAB — CBC
HCT: 28.3 % — ABNORMAL LOW (ref 39.0–52.0)
HEMOGLOBIN: 9 g/dL — AB (ref 13.0–17.0)
MCH: 25.8 pg — AB (ref 26.0–34.0)
MCHC: 31.8 g/dL (ref 30.0–36.0)
MCV: 81.1 fL (ref 78.0–100.0)
Platelets: 372 10*3/uL (ref 150–400)
RBC: 3.49 MIL/uL — AB (ref 4.22–5.81)
RDW: 14.6 % (ref 11.5–15.5)
WBC: 9.6 10*3/uL (ref 4.0–10.5)

## 2014-12-03 LAB — VANCOMYCIN, TROUGH: Vancomycin Tr: 19 ug/mL (ref 10.0–20.0)

## 2014-12-03 SURGERY — VIDEO BRONCHOSCOPY WITHOUT FLUORO
Anesthesia: Moderate Sedation | Laterality: Bilateral

## 2014-12-03 MED ORDER — LIDOCAINE HCL 1 % IJ SOLN
INTRAMUSCULAR | Status: DC | PRN
  Administered 2014-12-03: 6 mL via RESPIRATORY_TRACT

## 2014-12-03 MED ORDER — FENTANYL CITRATE (PF) 100 MCG/2ML IJ SOLN
INTRAMUSCULAR | Status: AC
  Filled 2014-12-03: qty 4

## 2014-12-03 MED ORDER — FENTANYL CITRATE (PF) 100 MCG/2ML IJ SOLN
INTRAMUSCULAR | Status: DC | PRN
  Administered 2014-12-03 (×2): 50 ug via INTRAVENOUS

## 2014-12-03 MED ORDER — MIDAZOLAM HCL 10 MG/2ML IJ SOLN
INTRAMUSCULAR | Status: DC | PRN
  Administered 2014-12-03 (×3): 2 mg via INTRAVENOUS

## 2014-12-03 MED ORDER — SODIUM CHLORIDE 0.9 % IV SOLN
INTRAVENOUS | Status: DC
  Administered 2014-12-03 (×2): via INTRAVENOUS

## 2014-12-03 MED ORDER — MIDAZOLAM HCL 5 MG/ML IJ SOLN
INTRAMUSCULAR | Status: AC
  Filled 2014-12-03: qty 2

## 2014-12-03 MED ORDER — LIDOCAINE HCL 2 % EX GEL: 1.0000 "application " | Freq: Once | CUTANEOUS | Status: DC

## 2014-12-03 MED ORDER — PHENYLEPHRINE HCL 0.25 % NA SOLN: 1.0000 | Freq: Four times a day (QID) | NASAL | Status: DC | PRN

## 2014-12-03 MED ORDER — BUTAMBEN-TETRACAINE-BENZOCAINE 2-2-14 % EX AERO: 1.0000 | INHALATION_SPRAY | Freq: Once | CUTANEOUS | Status: DC

## 2014-12-03 MED ORDER — SODIUM CHLORIDE 0.9 % IJ SOLN
10.0000 mL | INTRAMUSCULAR | Status: DC | PRN
  Administered 2014-12-04: 10 mL
  Filled 2014-12-03: qty 40

## 2014-12-03 NOTE — Progress Notes (Signed)
    [facility] has been requested to perform a transesophageal echocardiogram on Mr Marc [name] on 12/04/14 for rule out of embolic emboli .  After careful review of history and examination, the risks and benefits of transesophageal echocardiogram have been explained including risks of esophageal damage, perforation (1:10,000 risk), bleeding, pharyngeal hematoma as well as other potential complications associated with conscious sedation including aspiration, arrhythmia, respiratory failure and death. Alternatives to treatment were discussed, questions were answered. Patient is willing to proceed.   Evalene Vath R, NP 12/03/2014 12:04 PM

## 2014-12-03 NOTE — Progress Notes (Addendum)
[facility]                                                                              Patient Demographics  Marc [name], is a 54 y.o. male, DOB - 02/13/1961, ZOX:[id number]  Admit date - 11/30/2014   Admitting Physician Eduard Clos, MD  Outpatient Primary MD for the patient is No PCP Per Patient  LOS - 3   Chief Complaint  Patient presents with  . Urinary Cath prob   . Blood Sugar Problem      HPI on 11/30/2014 by Dr. Midge Minium Marc [name] is a 54 y.o. male with history of hypertension and diabetes mellitus type 2 who was admitted last week for sepsis secondary to UTI and pneumonia and was placed on Foley catheter for urinary obstruction presents to the ER because he wanted to have his catheter removed. During last admission patient's blood culture grew MRSA and hospitalist had called him directly for direct admission and patient did not come to the ER. In the ER today patient was found to be febrile and tachycardic. Chest x-ray shows possible septic emboli and CT chest shows multiple nodules with cavitation. Repeat blood cultures have been obtained and patient has been placed on empiric antibiotics and admitted for further management. On exam patient is asymptomatic and denies any chest pain shortness of breath productive cough but did complain of some subjective feeling of fever or chills. Denies any abdominal pain nausea vomiting or diarrhea. Patient's blood sugar is also found to be elevated but not in DKA. Patient has been noncompliant with his medications.   Assessment & Plan   Sepsis secondary to MRSA bacteremia -Blood cultures from 11/21/2014 show MRSA -Repeat cultures on 11/30/2014 negative to date -CT chest shows multiple pulmonary nodules bilaterally, many with cavitation, appearance compatible septic emboli, TB or fungal infection -Urine culture no growth to date -Leukocytosis improving, continues to have some tachycardia -Infectious  disease consulted and appreciated, concern for TB as patient does have risk factors -Echocardiogram: EF 55-60%, no defect or PFO noted, no vegetation -Spoke with Dr. Ninetta Lights, infectious disease, who recommended BAL -Pulmonology consulted appreciated, bronchoscopy planned for 9 AM today. -Continue vancomycin, Zosyn was discontinued on 12/01/2014 -Suspect pulmonary nodule likely secondary to MRSA bacteremia -QuantiFERON TB cold pending -Will have PICC line placed -Will speak with cardiology regarding TEE  Diabetes mellitus, type II -Continue Lantus, Amaryl, insulin sliding scale with CBG monitoring  Hypertension -Continue amlodipine  Normocytic anemia -Baseline hemoglobin appears between 9 and 10, currently 9 -drop likely dilutional -Continue to monitor CBC -Iron 19 -Continue iron supplementation  Code Status: Full  Family Communication: None at bedside  Disposition Plan: Admitted. Pending results of BAL  Time Spent in minutes   30 minutes  Procedures  Echocardiogram Bronchoscopy  Consults   Infectious disease Pulmonology   DVT Prophylaxis  Lovenox  Lab Results  Component Value Date   PLT 372 12/03/2014    Medications  Scheduled Meds: . amLODipine  10 mg Oral Daily  . butamben-tetracaine-benzocaine  1 spray Topical Once  . enoxaparin (LOVENOX) injection  40 mg Subcutaneous Q24H  . folic acid  1 mg Oral Daily  . glimepiride  2 mg Oral Q breakfast  . insulin aspart  0-9 Units Subcutaneous TID WC  . insulin glargine  35 Units Subcutaneous QHS  . lidocaine  1 application Topical Once  . tamsulosin  0.4 mg Oral QPC supper  . thiamine  100 mg Oral Daily  . vancomycin  1,000 mg Intravenous Q8H   Continuous Infusions: . sodium chloride 10 mL/hr at 12/03/14 0847   PRN Meds:.acetaminophen **OR** acetaminophen, ondansetron **OR** ondansetron (ZOFRAN) IV, phenylephrine  Antibiotics    Anti-infectives    Start     Dose/Rate Route Frequency Ordered Stop    12/01/14 0600  vancomycin (VANCOCIN) IVPB 1000 mg/200 mL premix     1,000 mg 200 mL/hr over 60 Minutes Intravenous Every 8 hours 11/30/14 2057     12/01/14 0100  piperacillin-tazobactam (ZOSYN) IVPB 3.375 g  Status:  Discontinued     3.375 g 12.5 mL/hr over 240 Minutes Intravenous 3 times per day 12/01/14 0051 12/01/14 1701   11/30/14 2045  vancomycin (VANCOCIN) 1,500 mg in sodium chloride 0.9 % 500 mL IVPB     1,500 mg 250 mL/hr over 120 Minutes Intravenous  Once 11/30/14 2026 11/30/14 2343      Subjective:   [name] seen and examined today.  Patient states "I feel great, can I go home?"  He denies chest pain, shortness of breath, abdominal pain, nausea, vomiting, dizziness, headache.   Objective:   Filed Vitals:   12/03/14 0920 12/03/14 0925 12/03/14 0930 12/03/14 0943  BP: 161/93 128/76 187/94 126/76  Pulse:      Temp:      TempSrc:      Resp: Height:      Weight:      SpO2: 94% 99% 94% 94%    Wt Readings from Last 3 Encounters:  12/03/14 74.934 kg (165 lb 3.2 oz)  11/16/14 86.3 kg (190 lb 4.1 oz)  10/05/14 89.132 kg (196 lb 8 oz)     Intake/Output Summary (Last 24 hours) at 12/03/14 0947 Last data filed at 12/03/14 0942  Gross per 24 hour  Intake   1700 ml  Output   3150 ml  Net  -1450 ml    Exam  General: Well developed, well nourished, no distress  HEENT: NCAT,  mucous membranes moist. Poor dentition   Cardiovascular: S1 S2 auscultated, no murmurs/rubs/gallops, Regular rate and rhythm.  Respiratory: Clear to auscultation  Abdomen: Soft, nontender, nondistended, + bowel sounds  Extremities: warm dry without cyanosis clubbing or edema  Neuro: AAOx3nonfocal   Psych: Appropriate mood and affect  Data Review   Micro Results Recent Results (from the past 240 hour(s))  Blood culture (routine x 2)     Status: None (Preliminary result)   Collection Time: 11/30/14  8:56 PM  Result Value Ref Range Status   Specimen Description  BLOOD RIGHT HAND  Final   Special Requests BOTTLES DRAWN AEROBIC AND ANAEROBIC 10CC EA  Final   Culture   Final    NO GROWTH 2 DAYS Performed at [facility]    Report Status PENDING  Incomplete  Blood culture (routine x 2)     Status: None (Preliminary result)   Collection Time: 11/30/14  9:19 PM  Result Value Ref Range Status   Specimen Description BLOOD LEFT ANTECUBITAL  Final   Special Requests BOTTLES DRAWN AEROBIC AND ANAEROBIC 5CC EA  Final   Culture   Final    NO GROWTH 2 DAYS Performed at [facility]    Report Status PENDING  Incomplete  Urine culture     Status: None   Collection Time: 11/30/14  9:57 PM  Result Value Ref Range Status   Specimen Description URINE, CATHETERIZED  Final   Special Requests NONE  Final   Culture   Final    NO GROWTH 2 DAYS Performed at [facility]    Report Status 12/02/2014 FINAL  Final    Radiology Reports Dg Chest 2 View  11/30/2014   CLINICAL DATA:  Recently hospitalized with urinary tract infection. Fever. History of diabetes. Initial encounter.  EXAM: CHEST  2 VIEW  COMPARISON:  11/21/2014 and 11/18/2014 radiographs.  FINDINGS: The heart size and mediastinal contours are stable without definite adenopathy. There is overall improved aeration of the left lung base. However, the patient has developed ill-defined nodular densities in both lungs worrisome for possible septic emboli. No significant pleural effusion. The bones appear unchanged.  IMPRESSION: Interval increased nodularity to the previously demonstrated bilateral airspace opacities worrisome for developing septic emboli. No significant pleural effusion.  Further evaluation with chest CT recommended (preferably with contrast if possible).   Electronically Signed   By: Carey BullocksWilliam  Veazey M.D.   On: 11/30/2014 21:16   Ct Chest W Contrast  11/30/2014   CLINICAL DATA:  Recent diagnosis of urinary tract infection 2 weeks ago. Now developing right lateral chest pain  since this morning. Plain radiographs demonstrated increase nodular infiltrates.  EXAM: CT CHEST WITH CONTRAST  TECHNIQUE: Multidetector CT imaging of the chest was performed during intravenous contrast administration.  CONTRAST:  80mL OMNIPAQUE IOHEXOL 300 MG/ML  SOLN  COMPARISON:  Chest 11/30/2014  FINDINGS: Multiple diffuse nodular areas of consolidation throughout both lungs, many with cavitation. Largest lesion is present in the superior segment left lower lobe and measures about 1.8 x 2.3 cm. Appearance is compatible with septic emboli although nonspecific. Short-term follow-up after resolution of acute process is recommended to exclude metastasis. Differential diagnosis would also include Wegner's granulomatosis or atypical infection such as TB or fungal infection. Small right pleural effusion. No pneumothorax.  Normal heart size. Normal caliber thoracic aorta. Scattered mediastinal and hilar lymph nodes as well as axillary lymph nodes are present without pathologic enlargement. These are likely reactive. Small esophageal hiatal hernia. Esophagus is mostly decompressed.  Included portions of the upper abdominal organs are grossly unremarkable. No destructive bone lesions.  IMPRESSION: Multiple pulmonary nodules bilaterally, many with cavitation. Appearance is compatible with septic emboli although differential diagnosis would include metastasis, TB or fungal infection. Small right pleural effusion.   Electronically Signed   By: Burman NievesWilliam  Stevens M.D.   On: 11/30/2014 22:45   [place] Renal  11/17/2014   CLINICAL DATA:  Acute renal failure, history hypertension, diabetes  EXAM: RENAL / URINARY TRACT ULTRASOUND COMPLETE  COMPARISON:  None  FINDINGS: Right Kidney:  Length: 11.4 cm. Upper normal cortical echogenicity. Normal cortical thickness. Mild collecting system dilatation. No renal mass or shadowing calcification.  Left Kidney:  Length: 13.0 cm. Normal cortical thickness with slight fetal lobulation noted.  Slightly increased cortical echogenicity. Mild collecting system dilatation. No mass or shadowing calcification.  Bladder:  Distended, containing significant debris.  No gross/discrete mass.  IMPRESSION: Distended urinary bladder containing significant debris.  Mild BILATERAL renal collecting system dilatation though uncertain if this could be related to bladder distention.   Electronically Signed   By: Ulyses SouthwardMark  Boles M.D.   On: 11/17/2014 10:11   Dg Chest Port 1 View  11/21/2014  CLINICAL DATA:  Sepsis.  EXAM: PORTABLE CHEST - 1 VIEW  COMPARISON:  11/18/2014  FINDINGS: Heart size remains within normal limits. Prominent lung markings could represent edema. Again noted are patchy densities at the right lung base. Increased densities at the left lung base are concerning for pleural fluid and airspace disease. Concern for subtle airspace disease in left upper lung.  IMPRESSION: Increased densities at the left lung base likely represent pleural fluid and airspace disease.  Prominent lung markings with more confluent disease in the left upper lung and right lung base. Findings could represent asymmetric edema versus multi focal airspace disease.   Electronically Signed   By: Richarda Overlie M.D.   On: 11/21/2014 10:33   Dg Chest Port 1 View  11/18/2014   CLINICAL DATA:  Sepsis and weakness.  EXAM: PORTABLE CHEST - 1 VIEW  COMPARISON:  03/06/2014  FINDINGS: Subtle patchy densities at the right lung base. Heart size is normal. The trachea is midline. There is fullness in the mediastinum which could represent vascular congestion. Negative for a pneumothorax.  IMPRESSION: Patchy densities in the right lower lung with possible vascular congestion. Differential includes asymmetric pulmonary edema versus right lower lobe pneumonia/airspace disease. Recommend follow-up evaluation.   Electronically Signed   By: Richarda Overlie M.D.   On: 11/18/2014 15:26    CBC  Recent Labs Lab 11/30/14 1952 11/30/14 2129 12/01/14 0117  12/03/14 0611  WBC 12.0*  --  10.7* 9.6  HGB 9.4* 10.9* 8.8* 9.0*  HCT 28.6* 32.0* 28.1* 28.3*  PLT 381  --  379 372  MCV 83.4  --  83.4 81.1  MCH 27.4  --  26.1 25.8*  MCHC 32.9  --  31.3 31.8  RDW 15.2  --  14.9 14.6  LYMPHSABS  --   --  1.2  --   MONOABS  --   --  0.7  --   EOSABS  --   --  0.1  --   BASOSABS  --   --  0.0  --     Chemistries   Recent Labs Lab 11/30/14 2056 11/30/14 2129 12/01/14 0117 12/03/14 0611  NA 134* 131* 136 137  K 4.1 4.7 3.6 3.3*  CL 96* 94* 102 102  CO2 29  --  28 29  GLUCOSE 390* 551* 338* 73  BUN 18 23* 15 8  CREATININE 1.08 1.00 0.91 0.74  CALCIUM 8.8*  --  8.0* 8.5*  AST  --   --  21  --   ALT  --   --  38  --   ALKPHOS  --   --  187*  --   BILITOT  --   --  0.7  --    ------------------------------------------------------------------------------------------------------------------ estimated creatinine clearance is 111.5 mL/min (by C-G formula based on Cr of 0.74). ------------------------------------------------------------------------------------------------------------------ No results for input(s): HGBA1C in the last 72 hours. ------------------------------------------------------------------------------------------------------------------ No results for input(s): CHOL, HDL, LDLCALC, TRIG, CHOLHDL, LDLDIRECT in the last 72 hours. ------------------------------------------------------------------------------------------------------------------ No results for input(s): TSH, T4TOTAL, T3FREE, THYROIDAB in the last 72 hours.  Invalid input(s): FREET3 ------------------------------------------------------------------------------------------------------------------ No results for input(s): VITAMINB12, FOLATE, FERRITIN, TIBC, IRON, RETICCTPCT in the last 72 hours.  Coagulation profile  Recent Labs Lab 12/01/14 0049  INR 1.15    No results for input(s): DDIMER in the last 72 hours.  Cardiac Enzymes No results for input(s):  CKMB, TROPONINI, MYOGLOBIN in the last 168 hours.  Invalid input(s): CK ------------------------------------------------------------------------------------------------------------------ Invalid input(s): POCBNP    Breeann Reposa  D.O. on 12/03/2014 at 9:47 AM  Between 7am to 7pm - Pager - [phone]  After 7pm go to [email or web address] - password [email or web address]  And look for the night coverage person covering for me after hours  [facility] Office  [phone]

## 2014-12-03 NOTE — Progress Notes (Signed)
Video bronchoscopy performed Intervention bronchial washings Pt tolerated well  Meghan Warshawsky David RRT  

## 2014-12-03 NOTE — Progress Notes (Signed)
ANTIBIOTIC CONSULT NOTE - Brief follow up  Pharmacy Consult for Vancomycin Indication: sepsis, MRSA bacteremia, pulmonary infection  Please see pharmacist note from earlier today for further detail.  Vancomycin trough ordered to assess if vancomycin is accumulating with q8h dosing.  Trough level remains therapeutic and patient is not accumulating.  7/12 Vancomycin trough = 20 Today's Vancomycin trough = 19  Plan: Continue Vancomycin 1g IV q8h.  Recommend continuing this dose at discharge with at least weekly SCr and trough levels during prolonged course.  Clance BollAmanda Jacora Hopkins, PharmD, BCPS Pager: [phone] 12/03/2014 2:20 PM

## 2014-12-03 NOTE — Progress Notes (Signed)
Peripherally Inserted Central Catheter/Midline Placement  The IV Nurse has discussed with the patient and/or persons authorized to consent for the patient, the purpose of this procedure and the potential benefits and risks involved with this procedure.  The benefits include less needle sticks, lab draws from the catheter and patient may be discharged home with the catheter.  Risks include, but not limited to, infection, bleeding, blood clot (thrombus formation), and puncture of an artery; nerve damage and irregular heat beat.  Alternatives to this procedure were also discussed.  PICC/Midline Placement Documentation        Marc [name], Marc [name] Renee 12/03/2014, 6:21 PM

## 2014-12-03 NOTE — Procedures (Signed)
Bronchoscopy Procedure Note Marc [name] [id number] February 04, 1961  Procedure: Bronchoscopy Indications: 54 yo male with MRSA bacteremia, cavitary nodule pneumonia, and concern for tuberculosis.  Bronchoscopist: Coralyn HellingVineet Audrielle Vankuren, MD First Assistant: Devra DoppSteve Minor, ACNP  Procedure Details Consent: Risks of procedure as well as the alternatives and risks of each were explained to the (patient/caregiver).  Consent for procedure obtained. Time Out: Verified patient identification, verified procedure, site/side was marked, verified correct patient position, special equipment/implants available, medications/allergies/relevent history reviewed, required imaging and test results available.  Performed  He was brought to bronchoscopy suite.  He had cetacaine applied to posterior pharynx for topical anesthesia.  He was given 4 mg versed, 100 mcg fentanyl for sedation and analgesia.  The bronchoscope was entered orally.  He was noted to have poor dentition.  He had 4 ml of 1% lidocaine instilled for topically anesthesia of airways.  The vocal cords were visualized and had normal motion.    The bronchoscope was entered into the trachea and carina visualized.  The bronchoscope was entered into the left main bronchus.  The left upper, lingular, and lower lobe orifices were visualized.  Mucosa normal, and no endobronchial lesions.  The bronchoscope was entered into the right main bronchus.  There right upper, middle, and lower lobe orifices were visualized.  Mucosa normal, and no endobronchial lesions.  The bronchoscope was wedged into anterior segment of right upper lobe.  Instilled 60 ml of saline for bronchoalveolar lavage.  There was 25 ml of cloudy, white fluid returned.  The bronchoscope was then withdrawn.  He had transient sinus tachycardia related to coughing.  No other immediate complications.  Plan Will send BAL rt upper lobe for quantitative culture, AFB and culture, fungal smear and  culture, and cytology.  Coralyn HellingVineet Edeline Greening, MD [facility] 12/03/2014, 9:40 AM Pager:  [phone] After 3pm call: [phone]

## 2014-12-03 NOTE — Hospital Discharge Follow-Up (Signed)
Transitional Care Clinic:  This Case Manager met with patient at bedside to inform of [facility] appointment on 12/07/14 at 0900 with Dr. Jarold Song. Patient verbalized understanding. Patient aware of clinic location and aware how to reach clinic upon discharge. Patient plans to use [facility] upon discharge to obtain needed medications. Reiterated pharmacy resources to patient. Patient appreciative of information.  Per inpatient nurse case manager note, patient set-up with [facility] for IV antibiotics. Foley in place. Patient indicates he needs a glucometer and diabetes supplies.  Call placed to Myraette McGibboney, RN CM to inform of patient's need for prescription for glucometer, strips, and lancets at discharge. True Metrix meter and strips most cost effective at [employer] [facility] [employer]. Unable to reach RN CM; voicemail left with above information.

## 2014-12-03 NOTE — Progress Notes (Signed)
ANTIBIOTIC CONSULT NOTE - FOLLOW UP  Pharmacy Consult for Vancomycin Indication: sepsis, MRSA bacteremia, pulmonary infection  No Known Allergies  Patient Measurements: Height:  (177.8 cm) Weight: 165 lb 3.2 oz (74.934 kg) IBW/kg (Calculated) : 73 Adjusted Body Weight:   Vital Signs: Temp: 99.2 F (37.3 C) (07/14 0837) Temp Source: Oral (07/14 0837) BP: 126/76 mmHg (07/14 0945) Pulse Rate: 106 (07/14 0620) Intake/Output from previous day: 07/13 0701 - 07/14 0700 In: 1760 [P.O.:960; IV Piggyback:800] Out: 3300 [Urine:3300] Intake/Output from this shift: Total I/O In: 300 [I.V.:100; IV Piggyback:200] Out: 350 [Urine:350]  Labs:  Recent Labs  11/30/14 1952  11/30/14 2129 12/01/14 0117 12/03/14 0611  WBC 12.0*  --   --  10.7* 9.6  HGB 9.4*  --  10.9* 8.8* 9.0*  PLT 381  --   --  379 372  CREATININE  --   < > 1.00 0.91 0.74  < > = values in this interval not displayed. Estimated Creatinine Clearance: 111.5 mL/min (by C-G formula based on Cr of 0.74).  Recent Labs  12/01/14 2110  [facility] 20     Microbiology: Recent Results (from the past 720 hour(s))  Urine culture     Status: None   Collection Time: 11/16/14 12:11 PM  Result Value Ref Range Status   Specimen Description URINE, CLEAN CATCH  Final   Special Requests NONE  Final   Culture   Final    MULTIPLE SPECIES PRESENT, SUGGEST RECOLLECTION IF CLINICALLY INDICATED Performed at [facility]    Report Status 11/18/2014 FINAL  Final  Culture, blood (routine x 2) Call MD if unable to obtain prior to antibiotics being given     Status: None   Collection Time: 11/21/14 10:41 AM  Result Value Ref Range Status   Specimen Description BLOOD ARM LEFT  Final   Special Requests   Final    BOTTLES DRAWN AEROBIC AND ANAEROBIC 10CC BOTH BOTTLES   Culture  Setup Time   Final    GRAM POSITIVE COCCI IN CLUSTERS Gram Stain Report Called to,Read Back By and Verified With: M. Long RN 17:50 11/24/14  ([email or web address]) ANAEROBIC BOTTLE ONLY    Culture   Final    METHICILLIN RESISTANT STAPHYLOCOCCUS AUREUS Performed at [facility]    Report Status 11/27/2014 FINAL  Final   Organism ID, Bacteria METHICILLIN RESISTANT STAPHYLOCOCCUS AUREUS  Final      Susceptibility   Methicillin resistant staphylococcus aureus - MIC*    CIPROFLOXACIN >=8 RESISTANT Resistant     ERYTHROMYCIN >=8 RESISTANT Resistant     GENTAMICIN <=0.5 SENSITIVE Sensitive     OXACILLIN >=4 RESISTANT Resistant     TETRACYCLINE <=1 SENSITIVE Sensitive     VANCOMYCIN 1 SENSITIVE Sensitive     TRIMETH/SULFA <=10 SENSITIVE Sensitive     CLINDAMYCIN <=0.25 SENSITIVE Sensitive     RIFAMPIN <=0.5 SENSITIVE Sensitive     Inducible Clindamycin NEGATIVE Sensitive     * METHICILLIN RESISTANT STAPHYLOCOCCUS AUREUS  Culture, blood (routine x 2) Call MD if unable to obtain prior to antibiotics being given     Status: None   Collection Time: 11/21/14 10:49 AM  Result Value Ref Range Status   Specimen Description BLOOD RIGHT ARM  Final   Special Requests   Final    BOTTLES DRAWN AEROBIC AND ANAEROBIC 10CC BOTH BOTTLES   Culture  Setup Time   Final    GRAM POSITIVE COCCI IN CLUSTERS ANAEROBIC BOTTLE ONLY CRITICAL RESULT CALLED TO, READ BACK  BY AND VERIFIED WITH: A DEVINE,MD AT [id number] 11/24/14 BY L BENFIELD    Culture   Final    METHICILLIN RESISTANT STAPHYLOCOCCUS AUREUS SUSCEPTIBILITIES PERFORMED ON PREVIOUS CULTURE WITHIN THE LAST 5 DAYS. Performed at [facility]    Report Status 11/27/2014 FINAL  Final  Blood culture (routine x 2)     Status: None (Preliminary result)   Collection Time: 11/30/14  8:56 PM  Result Value Ref Range Status   Specimen Description BLOOD RIGHT HAND  Final   Special Requests BOTTLES DRAWN AEROBIC AND ANAEROBIC 10CC EA  Final   Culture   Final    NO GROWTH 2 DAYS Performed at [facility]    Report Status PENDING  Incomplete  Blood culture (routine x 2)     Status: None  (Preliminary result)   Collection Time: 11/30/14  9:19 PM  Result Value Ref Range Status   Specimen Description BLOOD LEFT ANTECUBITAL  Final   Special Requests BOTTLES DRAWN AEROBIC AND ANAEROBIC 5CC EA  Final   Culture   Final    NO GROWTH 2 DAYS Performed at [facility]    Report Status PENDING  Incomplete  Urine culture     Status: None   Collection Time: 11/30/14  9:57 PM  Result Value Ref Range Status   Specimen Description URINE, CATHETERIZED  Final   Special Requests NONE  Final   Culture   Final    NO GROWTH 2 DAYS Performed at [facility]    Report Status 12/02/2014 FINAL  Final    Anti-infectives    Start     Dose/Rate Route Frequency Ordered Stop   12/01/14 0600  vancomycin (VANCOCIN) IVPB 1000 mg/200 mL premix     1,000 mg 200 mL/hr over 60 Minutes Intravenous Every 8 hours 11/30/14 2057     12/01/14 0100  piperacillin-tazobactam (ZOSYN) IVPB 3.375 g  Status:  Discontinued     3.375 g 12.5 mL/hr over 240 Minutes Intravenous 3 times per day 12/01/14 0051 12/01/14 1701   11/30/14 2045  vancomycin (VANCOCIN) 1,500 mg in sodium chloride 0.9 % 500 mL IVPB     1,500 mg 250 mL/hr over 120 Minutes Intravenous  Once 11/30/14 2026 11/30/14 2343      Assessment: 54 yoM admitted with sepsis, MRSA bacteremia, started on Vancomycin per pharmacy dosing.  CT chest shows multiple nodules with cavitation and differentials include TB, fungal infection or septic emboli. Airborne precautions. No vegetations on TTE. ID recs TEE. BAL performed 7/14.   7/11 >> vancomycin >> 7/12 >> zosyn >> 7/12  7/2 blood x 2: MRSA 2/2 7/11 blood x 2: ngtd x 2 days 7/11 urine: NGF 7/x AFB culture: ordered (not producing sputum, BAL ordered) 7/12 hepatitis panel: IP 7/12 Quant tb gold: IP 7/14 BAL fungus: 7/14 BAL culture: 7/14 BAL AFB:   Dose adjustments/levels: 7/12 2100 VT: 20 on 1g q8h  Goal of Therapy:  Vancomycin trough level 15-20 mcg/ml  Doses adjusted per renal  function Eradication of infection  Plan:  Check vancomycin trough at 1330.  F/u renal function, cultures, clinical course.   Haynes Hoehnolleen Yasmina [place], PharmD, BCPS 12/03/2014, 9:54 AM  Pager: [phone]

## 2014-12-03 NOTE — Progress Notes (Signed)
INFECTIOUS DISEASE PROGRESS NOTE  ID: Marc [name] is a 54 y.o. male with  Principal Problem:   Sepsis Active Problems:   Essential hypertension   MRSA bacteremia   Diabetes mellitus type 2, uncontrolled   Septic embolism  Subjective: Feels well, denies difficulty breathing.   Abtx:  Anti-infectives    Start     Dose/Rate Route Frequency Ordered Stop   12/01/14 0600  vancomycin (VANCOCIN) IVPB 1000 mg/200 mL premix     1,000 mg 200 mL/hr over 60 Minutes Intravenous Every 8 hours 11/30/14 2057     12/01/14 0100  piperacillin-tazobactam (ZOSYN) IVPB 3.375 g  Status:  Discontinued     3.375 g 12.5 mL/hr over 240 Minutes Intravenous 3 times per day 12/01/14 0051 12/01/14 1701   11/30/14 2045  vancomycin (VANCOCIN) 1,500 mg in sodium chloride 0.9 % 500 mL IVPB     1,500 mg 250 mL/hr over 120 Minutes Intravenous  Once 11/30/14 2026 11/30/14 2343      Medications:  Scheduled: . amLODipine  10 mg Oral Daily  . butamben-tetracaine-benzocaine  1 spray Topical Once  . enoxaparin (LOVENOX) injection  40 mg Subcutaneous Q24H  . folic acid  1 mg Oral Daily  . glimepiride  2 mg Oral Q breakfast  . insulin aspart  0-9 Units Subcutaneous TID WC  . insulin glargine  35 Units Subcutaneous QHS  . lidocaine  1 application Topical Once  . tamsulosin  0.4 mg Oral QPC supper  . thiamine  100 mg Oral Daily  . vancomycin  1,000 mg Intravenous Q8H    Objective: Vital signs in last 24 hours: Temp:  [98.8 F (37.1 C)-99.2 F (37.3 C)] 99.2 F (37.3 C) (07/14 0837) Pulse Rate:  [100-106] 106 (07/14 0620) Resp:  [16-26] 21 (07/14 1000) BP: (97-187)/(67-96) 103/81 mmHg (07/14 1000) SpO2:  [94 %-100 %] 95 % (07/14 1000) Weight:  [74.934 kg (165 lb 3.2 oz)] 74.934 kg (165 lb 3.2 oz) (07/14 0620)   General appearance: alert, cooperative and no distress Resp: clear to auscultation bilaterally Cardio: regular rate and rhythm GI: normal findings: bowel sounds normal and soft,  non-tender  Lab Results  Recent Labs  12/01/14 0117 12/03/14 0611  WBC 10.7* 9.6  HGB 8.8* 9.0*  HCT 28.1* 28.3*  NA 136 137  K 3.6 3.3*  CL 102 102  CO2 28 29  BUN 15 8  CREATININE 0.91 0.74   Liver Panel  Recent Labs  12/01/14 0117  PROT 6.9  ALBUMIN 2.1*  AST 21  ALT 38  ALKPHOS 187*  BILITOT 0.7   Sedimentation Rate  Recent Labs  12/01/14 0117  ESRSEDRATE 92*   C-Reactive Protein No results for input(s): CRP in the last 72 hours.  Microbiology: Recent Results (from the past 240 hour(s))  Blood culture (routine x 2)     Status: None (Preliminary result)   Collection Time: 11/30/14  8:56 PM  Result Value Ref Range Status   Specimen Description BLOOD RIGHT HAND  Final   Special Requests BOTTLES DRAWN AEROBIC AND ANAEROBIC 10CC EA  Final   Culture   Final    NO GROWTH 2 DAYS Performed at [facility]    Report Status PENDING  Incomplete  Blood culture (routine x 2)     Status: None (Preliminary result)   Collection Time: 11/30/14  9:19 PM  Result Value Ref Range Status   Specimen Description BLOOD LEFT ANTECUBITAL  Final   Special Requests BOTTLES DRAWN AEROBIC  AND ANAEROBIC 5CC EA  Final   Culture   Final    NO GROWTH 2 DAYS Performed at [facility]    Report Status PENDING  Incomplete  Urine culture     Status: None   Collection Time: 11/30/14  9:57 PM  Result Value Ref Range Status   Specimen Description URINE, CATHETERIZED  Final   Special Requests NONE  Final   Culture   Final    NO GROWTH 2 DAYS Performed at [facility]    Report Status 12/02/2014 FINAL  Final    Studies/Results: No results found.   Assessment/Plan: MRSA bacteremia Nodular, cavitary Pneumonia DM2 HTN Tobacco use ETOH use  Total days of antibiotics: 3 vanco       Spoke with CCM, BAL looked clean. My great appreciation to Dr Craige Cotta.  Await AFB stain.  Await TEE For PIC, suspect he will need at least 3 weeks of vanco    Marc [name] Infectious Diseases (pager) [phone] [email or web address] 12/03/2014, 10:32 AM  LOS: 3 days

## 2014-12-03 NOTE — Progress Notes (Signed)
Pt was off unit from 0800 until 1230. Was NPO until 1300. Offered assistance with bath before he ate lunch but he did not want assistance at that time.

## 2014-12-04 ENCOUNTER — Encounter (HOSPITAL_COMMUNITY)
Admission: EM | Disposition: A | Discharge status: Home Health | Payer: Self-pay | Source: Home / Self Care | Attending: Internal Medicine

## 2014-12-04 ENCOUNTER — Encounter (HOSPITAL_COMMUNITY): Payer: Self-pay | Admitting: Pulmonary Disease

## 2014-12-04 DIAGNOSIS — R7881 Bacteremia: Secondary | ICD-10-CM

## 2014-12-04 DIAGNOSIS — I2690 Septic pulmonary embolism without acute cor pulmonale: Secondary | ICD-10-CM

## 2014-12-04 DIAGNOSIS — B9562 Methicillin resistant Staphylococcus aureus infection as the cause of diseases classified elsewhere: Secondary | ICD-10-CM

## 2014-12-04 LAB — BASIC METABOLIC PANEL
ANION GAP: 7 (ref 5–15)
BUN: 12 mg/dL (ref 6–20)
CO2: 27 mmol/L (ref 22–32)
Calcium: 8.2 mg/dL — ABNORMAL LOW (ref 8.9–10.3)
Chloride: 102 mmol/L (ref 101–111)
Creatinine, Ser: 0.9 mg/dL (ref 0.61–1.24)
GFR calc Af Amer: >60 mL/min (ref >60)
Glucose, Bld: 93 mg/dL (ref 65–99)
Potassium: 3.4 mmol/L — ABNORMAL LOW (ref 3.5–5.1)
Sodium: 136 mmol/L (ref 135–145)

## 2014-12-04 LAB — CBC
HCT: 26.4 % — ABNORMAL LOW (ref 39.0–52.0)
Hemoglobin: 8.4 g/dL — ABNORMAL LOW (ref 13.0–17.0)
MCH: 26.2 pg (ref 26.0–34.0)
MCHC: 31.8 g/dL (ref 30.0–36.0)
MCV: 82.2 fL (ref 78.0–100.0)
Platelets: 339 10*3/uL (ref 150–400)
RBC: 3.21 MIL/uL — ABNORMAL LOW (ref 4.22–5.81)
RDW: 14.9 % (ref 11.5–15.5)
WBC: 9.7 10*3/uL (ref 4.0–10.5)

## 2014-12-04 LAB — GLUCOSE, CAPILLARY
GLUCOSE-CAPILLARY: 62 mg/dL — AB (ref 65–99)
Glucose-Capillary: 61 mg/dL — ABNORMAL LOW (ref 65–99)
Glucose-Capillary: 137 mg/dL — ABNORMAL HIGH (ref 65–99)

## 2014-12-04 LAB — PROCALCITONIN: Procalcitonin: 0.2 ng/mL

## 2014-12-04 SURGERY — ECHOCARDIOGRAM, TRANSESOPHAGEAL
Anesthesia: Moderate Sedation

## 2014-12-04 MED ORDER — HEPARIN SOD (PORK) LOCK FLUSH 100 UNIT/ML IV SOLN
250.0000 [IU] | INTRAVENOUS | Status: AC | PRN
  Administered 2014-12-04: 250 [IU]

## 2014-12-04 MED ORDER — VANCOMYCIN HCL IN DEXTROSE 1-5 GM/200ML-% IV SOLN: 1500.0000 mg | Freq: Two times a day (BID) | INTRAVENOUS | Status: DC

## 2014-12-04 MED ORDER — VANCOMYCIN HCL 10 G IV SOLR
2000.0000 mg | INTRAVENOUS | Status: AC
  Administered 2014-12-04: 2000 mg via INTRAVENOUS
  Filled 2014-12-04: qty 2000

## 2014-12-04 MED ORDER — AMLODIPINE BESYLATE 10 MG PO TABS: 10.0000 mg | ORAL_TABLET | Freq: Every day | ORAL | Status: DC

## 2014-12-04 MED ORDER — GLIMEPIRIDE 2 MG PO TABS: 2.0000 mg | ORAL_TABLET | Freq: Every day | ORAL | Status: DC

## 2014-12-04 NOTE — Progress Notes (Signed)
Called to reschedule TEE as an OP. Discussed with Nada BoozerLaura Ingold FNP who is familiar with the case and she will look into it further.   Corine ShelterLUKE Maxx Calaway PA-C 12/04/2014 3:59 PM

## 2014-12-04 NOTE — Progress Notes (Signed)
Pt mother will be here to have education with Pam ([facility]) at 430pm according to patient.

## 2014-12-04 NOTE — Progress Notes (Signed)
[facility]  Date of Admission:  11/30/2014  Antibiotics: vancomycin  Subjective: No new events  Objective: Temp:  [98.4 F (36.9 C)-99.7 F (37.6 C)] 99.7 F (37.6 C) (07/15 0526) Pulse Rate:  [93-106] 93 (07/15 0526) Resp:  [18] 18 (07/15 0526) BP: (123-145)/(70-82) 125/72 mmHg (07/15 0953) SpO2:  [94 %-100 %] 94 % (07/15 0526) Weight:  [167 lb 4.8 oz (75.887 kg)] 167 lb 4.8 oz (75.887 kg) (07/15 0526)    Lab Results Lab Results  Component Value Date   WBC 9.7 12/04/2014   HGB 8.4* 12/04/2014   HCT 26.4* 12/04/2014   MCV 82.2 12/04/2014   PLT 339 12/04/2014    Lab Results  Component Value Date   CREATININE 0.90 12/04/2014   BUN 12 12/04/2014   NA 136 12/04/2014   K 3.4* 12/04/2014   CL 102 12/04/2014   CO2 27 12/04/2014    Lab Results  Component Value Date   ALT 38 12/01/2014   AST 21 12/01/2014   ALKPHOS 187* 12/01/2014   BILITOT 0.7 12/01/2014      Microbiology: Recent Results (from the past 240 hour(s))  Blood culture (routine x 2)     Status: None (Preliminary result)   Collection Time: 11/30/14  8:56 PM  Result Value Ref Range Status   Specimen Description BLOOD RIGHT HAND  Final   Special Requests BOTTLES DRAWN AEROBIC AND ANAEROBIC 10CC EA  Final   Culture   Final    NO GROWTH 3 DAYS Performed at [facility]    Report Status PENDING  Incomplete  Blood culture (routine x 2)     Status: None (Preliminary result)   Collection Time: 11/30/14  9:19 PM  Result Value Ref Range Status   Specimen Description BLOOD LEFT ANTECUBITAL  Final   Special Requests BOTTLES DRAWN AEROBIC AND ANAEROBIC 5CC EA  Final   Culture   Final    NO GROWTH 3 DAYS Performed at [facility]    Report Status PENDING  Incomplete  Urine culture     Status: None   Collection Time: 11/30/14  9:57 PM  Result Value Ref Range Status   Specimen Description URINE, CATHETERIZED  Final   Special Requests NONE  Final   Culture    Final    NO GROWTH 2 DAYS Performed at [facility]    Report Status 12/02/2014 FINAL  Final  Culture, bal-quantitative     Status: None (Preliminary result)   Collection Time: 12/03/14  9:29 AM  Result Value Ref Range Status   Specimen Description BRONCHIAL ALVEOLAR LAVAGE  Final   Special Requests NONE  Final   Gram Stain   Final    RARE WBC PRESENT, PREDOMINANTLY MONONUCLEAR RARE SQUAMOUS EPITHELIAL CELLS PRESENT FEW GRAM POSITIVE COCCI IN PAIRS IN CLUSTERS RARE GRAM NEGATIVE RODS Performed at [facility]    Culture   Final    Culture reincubated for better growth Performed at [facility]    Report Status PENDING  Incomplete  Fungus Culture with Smear     Status: None (Preliminary result)   Collection Time: 12/03/14  9:29 AM  Result Value Ref Range Status   Specimen Description BRONCHIAL ALVEOLAR LAVAGE  Final   Special Requests NONE  Final   Fungal Smear   Final    NO YEAST OR FUNGAL ELEMENTS SEEN Performed at [facility]    Culture   Final    CULTURE IN PROGRESS  FOR FOUR WEEKS Performed at [facility]    Report Status PENDING  Incomplete    Studies/Results: No results found.  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  Nothing noted on TTE.  I do not feel a TEE is needed at this time, though should have cardiology follow up due to presumed endocarditis.  Likely will need a repeat echo at some point to monitor valves.   -should get vancomycin per home health for 6 weeks total through August 21 -weekly cbc, cmp, vanco trough to [facility] -we will arrange follow up in about 3-4 weeks  Marc [name], Marc Maduro, MD [facility] [email or web address] [phone] pager   [phone] cell 12/04/2014, 1:19 PM

## 2014-12-04 NOTE — Progress Notes (Signed)
[facility] came by and stated that person that patient will be living with has no returned calls from her or patient. Patient homeless and unable to demonstrate appropriate care for PICC line. [facility] stated pt unsafe for discharge.

## 2014-12-04 NOTE — Progress Notes (Signed)
PCCM PROGRESS NOTE  Subjective: He denies hoarseness, chest pain, or dyspnea.  Objective: BP 125/72 mmHg  Pulse 93  Temp(Src) 99.7 F (37.6 C) (Oral)  Resp 18  Ht 5\' 10"  (1.778 m)  Wt 167 lb 4.8 oz (75.887 kg)  BMI 24.01 kg/m2  SpO2 94%  General: pleasant HEENT: no stridor Chest: no wheeze Cardiac: regular Abd: soft, non tender Ext: no edema Neuro: normal strength  CMP Latest Ref Rng 12/04/2014 12/03/2014 12/01/2014  Glucose 65 - 99 mg/dL 93 73 841(L338(H)  BUN 6 - 20 mg/dL 12 8 15   Creatinine 0.61 - 1.24 mg/dL 2.440.90 0.100.74 2.720.91  Sodium 135 - 145 mmol/L 136 137 136  Potassium 3.5 - 5.1 mmol/L 3.4(L) 3.3(L) 3.6  Chloride 101 - 111 mmol/L 102 102 102  CO2 22 - 32 mmol/L 27 29 28   Calcium 8.9 - 10.3 mg/dL 8.2(L) 8.5(L) 8.0(L)  Total Protein 6.5 - 8.1 g/dL - - 6.9  Total Bilirubin 0.3 - 1.2 mg/dL - - 0.7  Alkaline Phos 38 - 126 U/L - - 187(H)  AST 15 - 41 U/L - - 21  ALT 17 - 63 U/L - - 38    CBC Latest Ref Rng 12/04/2014 12/03/2014 12/01/2014  WBC 4.0 - 10.5 K/uL 9.7 9.6 10.7(H)  Hemoglobin 13.0 - 17.0 g/dL 5.3(G8.4(L) 6.4(Q9.0(L) 0.3(K8.8(L)  Hematocrit 39.0 - 52.0 % 26.4(L) 28.3(L) 28.1(L)  Platelets 150 - 400 K/uL 339 372 379    Dg Chest 2 View  11/30/2014   CLINICAL DATA:  Recently hospitalized with urinary tract infection. Fever. History of diabetes. Initial encounter.  EXAM: CHEST  2 VIEW  COMPARISON:  11/21/2014 and 11/18/2014 radiographs.  FINDINGS: The heart size and mediastinal contours are stable without definite adenopathy. There is overall improved aeration of the left lung base. However, the patient has developed ill-defined nodular densities in both lungs worrisome for possible septic emboli. No significant pleural effusion. The bones appear unchanged.  IMPRESSION: Interval increased nodularity to the previously demonstrated bilateral airspace opacities worrisome for developing septic emboli. No significant pleural effusion.  Further evaluation with chest CT recommended (preferably  with contrast if possible).   Electronically Signed   By: Carey BullocksWilliam  Veazey M.D.   On: 11/30/2014 21:16   Ct Chest W Contrast  11/30/2014   CLINICAL DATA:  Recent diagnosis of urinary tract infection 2 weeks ago. Now developing right lateral chest pain since this morning. Plain radiographs demonstrated increase nodular infiltrates.  EXAM: CT CHEST WITH CONTRAST  TECHNIQUE: Multidetector CT imaging of the chest was performed during intravenous contrast administration.  CONTRAST:  80mL OMNIPAQUE IOHEXOL 300 MG/ML  SOLN  COMPARISON:  Chest 11/30/2014  FINDINGS: Multiple diffuse nodular areas of consolidation throughout both lungs, many with cavitation. Largest lesion is present in the superior segment left lower lobe and measures about 1.8 x 2.3 cm. Appearance is compatible with septic emboli although nonspecific. Short-term follow-up after resolution of acute process is recommended to exclude metastasis. Differential diagnosis would also include Wegner's granulomatosis or atypical infection such as TB or fungal infection. Small right pleural effusion. No pneumothorax.  Normal heart size. Normal caliber thoracic aorta. Scattered mediastinal and hilar lymph nodes as well as axillary lymph nodes are present without pathologic enlargement. These are likely reactive. Small esophageal hiatal hernia. Esophagus is mostly decompressed.  Included portions of the upper abdominal organs are grossly unremarkable. No destructive bone lesions.  IMPRESSION: Multiple pulmonary nodules bilaterally, many with cavitation. Appearance is compatible with septic emboli although differential diagnosis would include  metastasis, TB or fungal infection. Small right pleural effusion.   Electronically Signed   By: Burman Nieves M.D.   On: 11/30/2014 22:45   Assessment/Plan:  MRSA bacteremia with cavitary pneumonia >> most likely septic emboli, but also concern for TB (less likely). Plan: - f/u BAL, AFB, fungal cx, and cytology from  bronch done on 7/14 - if AFB smear from BAL negative, then can d/c airborne isolation - Abx per primary team and ID - cardiology consulted to assess for TEE  PCCM can be available as needed if there are significant abnormalities on BAL results.  Will sign off otherwise.  Call if further assistance needed.  Coralyn Helling, MD [facility] Pulmonary/Critical Care 12/04/2014, 11:12 AM Pager:  [phone] After 3pm call: [phone]

## 2014-12-04 NOTE — Discharge Instructions (Signed)
Our office- cardiology will arrange outpt TEE.  And call you on Monday.   Bacteremia Bacteremia occurs when bacteria get in your blood. Normal blood does not usually have bacteria. Bacteremia is one way infections can spread from one part of the body to another. CAUSES   Causes may include anything that allows bacteria to get into the body. Examples are:  Catheters.  Intravenous (IV) access tubes.  Cuts or scrapes of the skin.  Temporary bacteremia may occur during dental procedures, while brushing your teeth, or during a bowel movement. This rarely causes any symptoms or medical problems.  Bacteria may also get in the bloodstream as a complication of a bacterial infection elsewhere. This includes infected wounds and bacterial infections of the:  Lungs (pneumonia).  Kidneys (pyelonephritis).  Intestines (enteritis, colitis).  Organs in the abdomen (appendicitis, cholecystitis, diverticulitis). SYMPTOMS  The body is usually able to clear small numbers of bacteria out of the blood quickly. Brief bacteremia usually does not cause problems.   Problems can occur if the bacteria start to grow in number or spread to other parts of the body. If the bacteria start growing, you may develop:  Chills.  Fever.  Nausea.  Vomiting.  Sweating.  Lightheadedness and low blood pressure.  Pain.  If bacteria start to grow in the linings around the brain, it is called meningitis. This can cause severe headaches, many other problems, and even death.  If bacteria start to grow in a joint, it causes arthritis with painful joints. If bacteria start to grow in a bone, it is called osteomyelitis.  Bacteria from the blood can also cause sores (abscesses) in many organs, such as the muscle, liver, spleen, lungs, brain, and kidneys. DIAGNOSIS   This condition is diagnosed by cultures of the blood.  Cultures may also be taken from other parts of the body that are thought to be causing the  bacteremia. A small piece of tissue, fluid, or other product of the body is sampled. The sample is then put on a growth plate to see if any bacteria grows.  Other lab tests may be done and the results may be abnormal. TREATMENT  Treatment requires a stay in the hospital. You will be given antibiotic medicine through an IV access tube. PREVENTION  People with an increased risk of developing bacteremia or complications may be given antibiotics before certain procedures. Examples are:  A person with a heart murmur or artificial heart valve, before having his or her teeth cleaned.  Before having a surgical or other invasive procedure.  Before having a bowel procedure. Document Released: 02/19/2006 Document Revised: 07/31/2011 Document Reviewed: 12/01/2010 [facility] Patient Information 2015 [employer] [place]. This information is not intended to replace advice given to you by your health care provider. Make sure you discuss any questions you have with your health care provider.

## 2014-12-04 NOTE — Progress Notes (Signed)
Discussed with Dr. Catha GosselinMikhail, we will arrange outpt TEE next week - was to be done today but at the time concern for TB but now ruled out.  But TEE could not be done today.  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

## 2014-12-04 NOTE — Progress Notes (Signed)
Pt going to stay with his mother [name] at [address] [place] [address], [phone].  A call to Mrs. Pecola revealed that she will allow the pt to stay with he and will help him with IV.

## 2014-12-04 NOTE — Discharge Summary (Addendum)
Physician Discharge Summary  Marc [name]:[id number] DOB: Oct 17, 1960 DOA: 11/30/2014  PCP: No PCP Per Patient  Admit date: 11/30/2014 Discharge date: 12/04/2014  Time spent: 45 minutes  Addendum 12/05/2014 Received call from case management regarding advanced him care. It seems that patient decided to stay with her friend and said his mother as he had initially stated. Patient's friend will not assist him with the IV antibiosis. His homecare has decided not to continue following and managing patient. Attempted to reach patient to speak with him, unable to do so. Anticipate that patient will return to the hospital when and if symptoms worsen.  Recommendations for Outpatient Follow-up:  Patient will be discharged to home with home health.  Patient will need to follow up with primary care provider within one week of discharge.  He will also need to follow up with cardiology, office will contact patient with appointment for echocardiogram.  Patient should continue medications as prescribed.  Patient should follow a Heart failure/Carb modified  diet.  Patient need to have weekly CBC, CMP, vanc trough, sent to infectious disease clinic.  Patient will need to follow up with infectious disease.   Patient can be contacted at [phone] or [phone].  Due to issues with arranging home health.  Patient was given 2gVanc dose before discharge.  Home health to resume vanc on Sunday, 12/06/2014.  Vanc trough will need to checked and dosage adjusted accordingly.   Discharge Diagnoses:  Sepsis secondary to MRSA bacteremia/septic emboli/suspected endocarditis Diabetes mellitus, type II  Hypertension Normocytic anemia  Discharge Condition: Stable  Diet recommendation: Heart failure/Carb modified   Filed Weights   12/02/14 0508 12/03/14 0620 12/04/14 0526  Weight: 75.841 kg (167 lb 3.2 oz) 74.934 kg (165 lb 3.2 oz) 75.887 kg (167 lb 4.8 oz)    History of present illness:  on 11/30/2014 by  Dr. Midge MiniumArshad Kakrakandy Marc RosierErvin L [name] is a 54 y.o. male with history of hypertension and diabetes mellitus type 2 who was admitted last week for sepsis secondary to UTI and pneumonia and was placed on Foley catheter for urinary obstruction presents to the ER because he wanted to have his catheter removed. During last admission patient's blood culture grew MRSA and hospitalist had called him directly for direct admission and patient did not come to the ER. In the ER today patient was found to be febrile and tachycardic. Chest x-ray shows possible septic emboli and CT chest shows multiple nodules with cavitation. Repeat blood cultures have been obtained and patient has been placed on empiric antibiotics and admitted for further management. On exam patient is asymptomatic and denies any chest pain shortness of breath productive cough but did complain of some subjective feeling of fever or chills. Denies any abdominal pain nausea vomiting or diarrhea. Patient's blood sugar is also found to be elevated but not in DKA. Patient has been noncompliant with his medications.   Hospital Course:  Sepsis secondary to MRSA bacteremia/ Septic emboli/ suspected endocarditis -Blood cultures from 11/21/2014 show MRSA -Repeat cultures on 11/30/2014 negative to date -CT chest shows multiple pulmonary nodules bilaterally, many with cavitation, appearance compatible septic emboli, TB or fungal infection -Urine culture no growth to date -Leukocytosis improving, continues to have some tachycardia -Infectious disease consulted and appreciated, concern for TB as patient does have risk factors -Echocardiogram: EF 55-60%, no defect or PFO noted, no vegetation -Spoke with Dr. Ninetta LightsHatcher, infectious disease, who recommended BAL -Pulmonology consulted appreciated, bronchoscopy preformed.  -BAL: GPC on gram stain, negative for yeast  of fungus -Continue vancomycin, Zosyn was discontinued on 12/01/2014 -Suspect pulmonary nodule  likely secondary to MRSA bacteremia -QuantiFERON TB cold pending -PICC line placed -TEE will be done as an outpatient. Have spoken with cardiology. They will contact him to set up an appointment.  -Patient will need to continue vancomycin for 6 weeks, through January 10, 2015 -Patient will be discharged with home health. Patient will need weekly CBC, CMP, and Vanc trough- sent to [facility].  -Patient will need to follow up with ID in 3-4 weeks.   Diabetes mellitus, type II -Continue Lantus, Amaryl, insulin sliding scale with CBG monitoring  Hypertension -Continue amlodipine  Normocytic anemia -Baseline hemoglobin appears between 9 and 10, currently 8.4 -drop likely dilutional -Continue to monitor CBC -Iron 19 -Continue iron supplementation  Procedures  Echocardiogram Bronchoscopy  Consults  Infectious disease Pulmonology   Discharge Exam: Filed Vitals:   12/04/14 1420  BP: 135/62  Pulse: 98  Temp: 98.5 F (36.9 C)  Resp: 18   Exam  General: Well developed, well nourished, no distress  HEENT: NCAT, mucous membranes moist. Poor dentition   Cardiovascular: S1 S2 auscultated, no murmurs/rubs/gallops, RRR  Respiratory: Clear to auscultation  Abdomen: Soft, nontender, nondistended, + bowel sounds  Extremities: warm dry without cyanosis clubbing or edema  Neuro: AAOx3nonfocal  Discharge Instructions     Medication List    ASK your doctor about these medications        accu-chek soft touch lancets  Use as instructed     acetaminophen 500 MG tablet  Commonly known as:  TYLENOL  Take 500 mg by mouth every 6 (six) hours as needed for mild pain.     alum & mag hydroxide-simeth 200-200-20 MG/5ML suspension  Commonly known as:  MAALOX/MYLANTA  Take 30 mLs by mouth every 6 (six) hours as needed for indigestion or heartburn.     amLODipine 10 MG tablet  Commonly known as:  NORVASC  Take 1 tablet (10 mg total) by mouth daily.     folic acid 1 MG tablet   Commonly known as:  FOLVITE  Take 1 tablet (1 mg total) by mouth daily.     glimepiride 2 MG tablet  Commonly known as:  AMARYL  Take 1 tablet (2 mg total) by mouth daily with breakfast.     glucose blood test strip  Use as instructed     insulin aspart 100 UNIT/ML injection  Commonly known as:  novoLOG  Inject 7 Units into the skin 3 (three) times daily with meals.     insulin glargine 100 UNIT/ML injection  Commonly known as:  LANTUS  Inject 0.35 mLs (35 Units total) into the skin at bedtime.     levofloxacin 500 MG tablet  Commonly known as:  LEVAQUIN  Take 1 tablet (500 mg total) by mouth daily.     multivitamin with minerals Tabs tablet  Take 1 tablet by mouth daily.     pantoprazole 40 MG tablet  Commonly known as:  PROTONIX  Take 1 tablet (40 mg total) by mouth daily.     potassium chloride SA 20 MEQ tablet  Commonly known as:  K-DUR,KLOR-CON  Take 1 tablet (20 mEq total) by mouth daily.     tamsulosin 0.4 MG Caps capsule  Commonly known as:  FLOMAX  Take 1 capsule (0.4 mg total) by mouth daily after supper.     thiamine 100 MG tablet  Take 1 tablet (100 mg total) by mouth daily.  No Known Allergies     Follow-up Information    Follow up with [facility]. Go on 12/07/2014.   Specialty:  Internal Medicine   Why:  at 9:00am   Contact information:   [id number] [place] [address] [phone]       The results of significant diagnostics from this hospitalization (including imaging, microbiology, ancillary and laboratory) are listed below for reference.    Significant Diagnostic Studies: Dg Chest 2 View  11/30/2014   CLINICAL DATA:  Recently hospitalized with urinary tract infection. Fever. History of diabetes. Initial encounter.  EXAM: CHEST  2 VIEW  COMPARISON:  11/21/2014 and 11/18/2014 radiographs.  FINDINGS: The heart size and mediastinal contours are stable without definite  adenopathy. There is overall improved aeration of the left lung base. However, the patient has developed ill-defined nodular densities in both lungs worrisome for possible septic emboli. No significant pleural effusion. The bones appear unchanged.  IMPRESSION: Interval increased nodularity to the previously demonstrated bilateral airspace opacities worrisome for developing septic emboli. No significant pleural effusion.  Further evaluation with chest CT recommended (preferably with contrast if possible).   Electronically Signed   By: Carey Bullocks M.D.   On: 11/30/2014 21:16   Ct Chest W Contrast  11/30/2014   CLINICAL DATA:  Recent diagnosis of urinary tract infection 2 weeks ago. Now developing right lateral chest pain since this morning. Plain radiographs demonstrated increase nodular infiltrates.  EXAM: CT CHEST WITH CONTRAST  TECHNIQUE: Multidetector CT imaging of the chest was performed during intravenous contrast administration.  CONTRAST:  80mL OMNIPAQUE IOHEXOL 300 MG/ML  SOLN  COMPARISON:  Chest 11/30/2014  FINDINGS: Multiple diffuse nodular areas of consolidation throughout both lungs, many with cavitation. Largest lesion is present in the superior segment left lower lobe and measures about 1.8 x 2.3 cm. Appearance is compatible with septic emboli although nonspecific. Short-term follow-up after resolution of acute process is recommended to exclude metastasis. Differential diagnosis would also include Wegner's granulomatosis or atypical infection such as TB or fungal infection. Small right pleural effusion. No pneumothorax.  Normal heart size. Normal caliber thoracic aorta. Scattered mediastinal and hilar lymph nodes as well as axillary lymph nodes are present without pathologic enlargement. These are likely reactive. Small esophageal hiatal hernia. Esophagus is mostly decompressed.  Included portions of the upper abdominal organs are grossly unremarkable. No destructive bone lesions.  IMPRESSION:  Multiple pulmonary nodules bilaterally, many with cavitation. Appearance is compatible with septic emboli although differential diagnosis would include metastasis, TB or fungal infection. Small right pleural effusion.   Electronically Signed   By: Burman Nieves M.D.   On: 11/30/2014 22:45   US Renal  11/17/2014   CLINICAL DATA:  Acute renal failure, history hypertension, diabetes  EXAM: RENAL / URINARY TRACT ULTRASOUND COMPLETE  COMPARISON:  None  FINDINGS: Right Kidney:  Length: 11.4 cm. Upper normal cortical echogenicity. Normal cortical thickness. Mild collecting system dilatation. No renal mass or shadowing calcification.  Left Kidney:  Length: 13.0 cm. Normal cortical thickness with slight fetal lobulation noted. Slightly increased cortical echogenicity. Mild collecting system dilatation. No mass or shadowing calcification.  Bladder:  Distended, containing significant debris.  No gross/discrete mass.  IMPRESSION: Distended urinary bladder containing significant debris.  Mild BILATERAL renal collecting system dilatation though uncertain if this could be related to bladder distention.   Electronically Signed   By: Ulyses Southward M.D.   On: 11/17/2014 10:11   Dg Chest Port 1  View  11/21/2014   CLINICAL DATA:  Sepsis.  EXAM: PORTABLE CHEST - 1 VIEW  COMPARISON:  11/18/2014  FINDINGS: Heart size remains within normal limits. Prominent lung markings could represent edema. Again noted are patchy densities at the right lung base. Increased densities at the left lung base are concerning for pleural fluid and airspace disease. Concern for subtle airspace disease in left upper lung.  IMPRESSION: Increased densities at the left lung base likely represent pleural fluid and airspace disease.  Prominent lung markings with more confluent disease in the left upper lung and right lung base. Findings could represent asymmetric edema versus multi focal airspace disease.   Electronically Signed   By: Richarda Overlie M.D.   On:  11/21/2014 10:33   Dg Chest Port 1 View  11/18/2014   CLINICAL DATA:  Sepsis and weakness.  EXAM: PORTABLE CHEST - 1 VIEW  COMPARISON:  03/06/2014  FINDINGS: Subtle patchy densities at the right lung base. Heart size is normal. The trachea is midline. There is fullness in the mediastinum which could represent vascular congestion. Negative for a pneumothorax.  IMPRESSION: Patchy densities in the right lower lung with possible vascular congestion. Differential includes asymmetric pulmonary edema versus right lower lobe pneumonia/airspace disease. Recommend follow-up evaluation.   Electronically Signed   By: Richarda Overlie M.D.   On: 11/18/2014 15:26    Microbiology: Recent Results (from the past 240 hour(s))  Blood culture (routine x 2)     Status: None (Preliminary result)   Collection Time: 11/30/14  8:56 PM  Result Value Ref Range Status   Specimen Description BLOOD RIGHT HAND  Final   Special Requests BOTTLES DRAWN AEROBIC AND ANAEROBIC 10CC EA  Final   Culture   Final    NO GROWTH 4 DAYS Performed at [facility]    Report Status PENDING  Incomplete  Blood culture (routine x 2)     Status: None (Preliminary result)   Collection Time: 11/30/14  9:19 PM  Result Value Ref Range Status   Specimen Description BLOOD LEFT ANTECUBITAL  Final   Special Requests BOTTLES DRAWN AEROBIC AND ANAEROBIC 5CC EA  Final   Culture   Final    NO GROWTH 4 DAYS Performed at [facility]    Report Status PENDING  Incomplete  Urine culture     Status: None   Collection Time: 11/30/14  9:57 PM  Result Value Ref Range Status   Specimen Description URINE, CATHETERIZED  Final   Special Requests NONE  Final   Culture   Final    NO GROWTH 2 DAYS Performed at [facility]    Report Status 12/02/2014 FINAL  Final  Culture, bal-quantitative     Status: None (Preliminary result)   Collection Time: 12/03/14  9:29 AM  Result Value Ref Range Status   Specimen Description BRONCHIAL ALVEOLAR  LAVAGE  Final   Special Requests NONE  Final   Gram Stain   Final    RARE WBC PRESENT, PREDOMINANTLY MONONUCLEAR RARE SQUAMOUS EPITHELIAL CELLS PRESENT FEW GRAM POSITIVE COCCI IN PAIRS IN CLUSTERS RARE GRAM NEGATIVE RODS Performed at [facility]    Culture   Final    Culture reincubated for better growth Performed at [facility]    Report Status PENDING  Incomplete  AFB culture with smear     Status: None (Preliminary result)   Collection Time: 12/03/14  9:29 AM  Result Value Ref Range Status   Specimen Description BRONCHIAL ALVEOLAR  LAVAGE  Final   Special Requests NONE  Final   Acid Fast Smear   Final    NO ACID FAST BACILLI SEEN Performed at [facility]    Culture   Final    CULTURE WILL BE EXAMINED FOR 6 WEEKS BEFORE ISSUING A FINAL REPORT Performed at [facility]    Report Status PENDING  Incomplete  Fungus Culture with Smear     Status: None (Preliminary result)   Collection Time: 12/03/14  9:29 AM  Result Value Ref Range Status   Specimen Description BRONCHIAL ALVEOLAR LAVAGE  Final   Special Requests NONE  Final   Fungal Smear   Final    NO YEAST OR FUNGAL ELEMENTS SEEN Performed at [facility]    Culture   Final    CULTURE IN PROGRESS FOR FOUR WEEKS Performed at [facility]    Report Status PENDING  Incomplete     Labs: Basic Metabolic Panel:  Recent Labs Lab 11/30/14 2056 11/30/14 2129 12/01/14 0117 12/03/14 0611 12/04/14 0400  NA 134* 131* 136 137 136  K 4.1 4.7 3.6 3.3* 3.4*  CL 96* 94* 102 102 102  CO2 29  --  GLUCOSE 390* 551* 338* 73 93  BUN 18 23* CREATININE 1.08 1.00 0.91 0.74 0.90  CALCIUM 8.8*  --  8.0* 8.5* 8.2*   Liver Function Tests:  Recent Labs Lab 12/01/14 0117  AST 21  ALT 38  ALKPHOS 187*  BILITOT 0.7  PROT 6.9  ALBUMIN 2.1*   No results for input(s): LIPASE, AMYLASE in the last 168 hours. No results for input(s): AMMONIA in the last 168  hours. CBC:  Recent Labs Lab 11/30/14 1952 11/30/14 2129 12/01/14 0117 12/03/14 0611 12/04/14 0400  WBC 12.0*  --  10.7* 9.6 9.7  NEUTROABS  --   --  8.7*  --   --   HGB 9.4* 10.9* 8.8* 9.0* 8.4*  HCT 28.6* 32.0* 28.1* 28.3* 26.4*  MCV 83.4  --  83.4 81.1 82.2  PLT 381  --  379 372 339   Cardiac Enzymes: No results for input(s): CKTOTAL, CKMB, CKMBINDEX, TROPONINI in the last 168 hours. BNP: BNP (last 3 results) No results for input(s): BNP in the last 8760 hours.  ProBNP (last 3 results) No results for input(s): PROBNP in the last 8760 hours.  CBG:  Recent Labs Lab 12/03/14 0734 12/03/14 1659 12/03/14 2134 12/04/14 0802 12/04/14 1116  GLUCAP 82 85 106* 61* 137*       Signed:  Davell Beckstead  [facility] 12/04/2014, 5:46 PM

## 2014-12-05 LAB — CULTURE, BLOOD (ROUTINE X 2)
CULTURE: NO GROWTH
CULTURE: NO GROWTH

## 2014-12-05 LAB — GLUCOSE, CAPILLARY: Glucose-Capillary: 112 mg/dL — ABNORMAL HIGH (ref 65–99)

## 2014-12-06 LAB — CULTURE, BAL-QUANTITATIVE W GRAM STAIN: Colony Count: 75000

## 2014-12-06 LAB — CULTURE, BAL-QUANTITATIVE

## 2014-12-06 NOTE — Care Management Note (Signed)
Case Management Note  Patient Details  Name: Marc [name] MRN: [id number] Date of Birth: 1961/05/01  Subjective/Objective:       Sepsis             Action/Plan: Home Health  Expected Discharge Date:  12/04/2014               Expected Discharge Plan:  Home w Home Health Services  In-House Referral:     Discharge planning Services  CM Consult, Follow-up appt scheduled  Post Acute Care Choice:  Home Health Choice offered to:     DME Arranged:    DME Agency:     HH Arranged:  RN HH Agency:     Status of Service:  Completed, signed off  Medicare Important Message Given:    Date Medicare IM Given:    Medicare IM give by:    Date Additional Medicare IM Given:    Additional Medicare Important Message give by:     If discussed at Long Length of Stay Meetings, dates discussed:    Additional Comments: [facility] to safety issues post discharge. [facility] has been unable to reach pt and [facility] rep spoke to mother, [name]. Mother states pt did not come to her home that he went to stay with girlfriend home. [facility] made several attempts to reach pt and girlfriend. Attending MD made aware.  Elliot CousinShavis, James Senn Ellen, RN 12/06/2014, 9:52 AM

## 2014-12-07 ENCOUNTER — Inpatient Hospital Stay: Payer: Self-pay | Admitting: Family Medicine

## 2014-12-07 ENCOUNTER — Other Ambulatory Visit: Payer: Self-pay | Admitting: Family Medicine

## 2014-12-07 ENCOUNTER — Telehealth: Payer: Self-pay

## 2014-12-07 DIAGNOSIS — E1165 Type 2 diabetes mellitus with hyperglycemia: Secondary | ICD-10-CM

## 2014-12-07 LAB — QUANTIFERON TB GOLD ASSAY (BLOOD)

## 2014-12-07 MED ORDER — TRUE METRIX METER W/DEVICE KIT: 1.0000 | PACK | Freq: Three times a day (TID) | Status: DC

## 2014-12-07 MED ORDER — GLUCOSE BLOOD VI STRP: ORAL_STRIP | Status: DC

## 2014-12-07 MED ORDER — TRUEPLUS LANCETS 30G MISC: 1.0000 | Freq: Three times a day (TID) | Status: DC

## 2014-12-07 NOTE — Telephone Encounter (Signed)
Follow up TCC meeting with the patient.   This CM received a call from the member's friend, Mickel Baas at approximately 4:30pm today.  She said that she received CM's voice mail message about contacting the patient she was in the waiting room at the [facility] w/ the patient.  This CM was in the [facility] when she called and this CM and Kizzie Bane, RN met w/ the patient and Mickel Baas.  He explained that he did not come to his appointment this morning because is was " hot."  Mickel Baas confirmed that he was staying w/ her. He stated that he was wondering when the nurse was coming to his house to give him the IV?  He also said that he did not have any of his prescriptions filled. He had the paper prescriptions that he was given at discharge.  Informed him that CM would contact [facility] to check on the status of the infusion and CM would also speak to Dr Jarold Song about scheduling an appt.with him  as soon as possible. He also inquired about foley catheter removal.  CM gave his prescriptions to the [facility] pharmacy to fill while he waited.  Dr Jarold Song sent a rx to the pharmacy for a glucometer and blood glucose testing supplies because had stated that he did not have a glucometer at home.  This CM and A. Obie Dredge, RN met w/ Dr Jarold Song and updated her on the patient's status, noting that he still has a PICC line and has not received any IV antibiotics or line flushes since discharge on 12/04/14.  He has not received any medications from [facility] and he has not had his discharge prescriptions filled. Dr Jarold Song agreed to see the patient tomorrow, 12/08/14 @ 1600. As per the encounter note from [facility], [facility],  [facility] will not be servicing the patient.  This CM called [facility] # [phone]  and spoke to [place], Mgr re.the status of services for the patient. She noted that the only information that she had was that they did not admit the patient d/t non compliance w/ his discharge plan. She said that she would investigate  further and would have the staff member involved w/ the patient contact this CM w/ more information.  This CM then met with the patient and Mickel Baas who were still in the waiting room waiting for his prescriptions to be filled. Informed him that an appointment has been scheduled for him w/ Dr Jarold Song tomorrow at 4:00pm and he needs to be at the clinic at 3:30pm. He said that he would do that and Mickel Baas said that she would bring him to the appointment. This CM confirmed with her a second time that she would bring him to the appointment.  He was also instructed to bring all of his medications to his appointment for the MD to review and not to do anything to the PICC line and both he and Mickel Baas verbalized understanding.  CM also explained that it is important to see the doctor to have her instruct him re.how to manage his infection and that not treating his infection can cause many serious complications.  Again, he and Mickel Baas verbalized understanding.   He asked about removing the foley catheter and CM told him that Dr Jarold Song would address that tomorrow. CM also explained that as per the conversation w/ [facility], they will not be providing the medication for him because his discharge plan was not what he agreed to.  He  said that he does not understand why he can't do the infusion himself and laura said that she could possibly assist. CM explained that the MD would assess him tomorrow and discuss a plan at that time.  This CM again stressed the importance of coming to the scheduled appointment tomorrow and again they both said that he would be at the appointment.   5:30pm - CM received a call from Vinnie Level, pharmacist w/ [facility]. She stated that the staff had worked with him for days to instruct him how to administer the medication and he was not capable of managing the infusion.  She said that his mother was going to help administer the IV medication but she never showed up for training at the hospital on  Friday, 7/15.  Vinnie Level also said that his friend, Mickel Baas, declined to admin the  IV medication for him. She noted that this activity was occurring late on Friday afternoon, 7/15.   She noted that he could not identify a caregiver and [facility] offered the option of seeing him on Sunday, 7/17 and he declined the service. She said that the hospital staff was aware of those issues before the patient was discharged and they were also aware that [facility] would not be servicing him. No medications were delivered to the home.   5:40pm - CM received a voice mail message from Carolynn Sayers, [facility] IV team, stating that she would speak to CM tomorrow about the patient's plan of care at home as she had worked with him when he was hospitalized. Marland Kitchen

## 2014-12-07 NOTE — Telephone Encounter (Signed)
Called the patient to check on his status and to confirm his appointment today # [phone]. Voice mail message left requesting a call back to # [phone] or [phone].

## 2014-12-08 ENCOUNTER — Inpatient Hospital Stay (HOSPITAL_COMMUNITY)
Admission: EM | Admit: 2014-12-08 | Discharge: 2014-12-10 | DRG: 288 | Discharge status: Against Medical Advice | Payer: Medicaid Other | Attending: Internal Medicine | Admitting: Internal Medicine

## 2014-12-08 ENCOUNTER — Encounter: Payer: Self-pay | Admitting: Family Medicine

## 2014-12-08 ENCOUNTER — Telehealth: Payer: Self-pay

## 2014-12-08 ENCOUNTER — Ambulatory Visit: Payer: Medicaid Other | Attending: Family Medicine | Admitting: Family Medicine

## 2014-12-08 ENCOUNTER — Encounter (HOSPITAL_COMMUNITY): Payer: Self-pay | Admitting: *Deleted

## 2014-12-08 VITALS — BP 166/86 | HR 116 | Temp 99.0°F | Ht 70.0 in | Wt 172.6 lb

## 2014-12-08 DIAGNOSIS — Z91199 Patient's noncompliance with other medical treatment and regimen due to unspecified reason: Secondary | ICD-10-CM

## 2014-12-08 DIAGNOSIS — E1310 Other specified diabetes mellitus with ketoacidosis without coma: Secondary | ICD-10-CM | POA: Diagnosis present

## 2014-12-08 DIAGNOSIS — B9562 Methicillin resistant Staphylococcus aureus infection as the cause of diseases classified elsewhere: Secondary | ICD-10-CM | POA: Diagnosis present

## 2014-12-08 DIAGNOSIS — I10 Essential (primary) hypertension: Secondary | ICD-10-CM | POA: Diagnosis not present

## 2014-12-08 DIAGNOSIS — Z8249 Family history of ischemic heart disease and other diseases of the circulatory system: Secondary | ICD-10-CM

## 2014-12-08 DIAGNOSIS — E1110 Type 2 diabetes mellitus with ketoacidosis without coma: Secondary | ICD-10-CM | POA: Diagnosis present

## 2014-12-08 DIAGNOSIS — I76 Septic arterial embolism: Secondary | ICD-10-CM | POA: Diagnosis present

## 2014-12-08 DIAGNOSIS — N320 Bladder-neck obstruction: Secondary | ICD-10-CM | POA: Diagnosis not present

## 2014-12-08 DIAGNOSIS — I2690 Septic pulmonary embolism without acute cor pulmonale: Secondary | ICD-10-CM | POA: Diagnosis not present

## 2014-12-08 DIAGNOSIS — Z794 Long term (current) use of insulin: Secondary | ICD-10-CM

## 2014-12-08 DIAGNOSIS — I38 Endocarditis, valve unspecified: Secondary | ICD-10-CM | POA: Diagnosis present

## 2014-12-08 DIAGNOSIS — I330 Acute and subacute infective endocarditis: Principal | ICD-10-CM | POA: Diagnosis present

## 2014-12-08 DIAGNOSIS — Z833 Family history of diabetes mellitus: Secondary | ICD-10-CM

## 2014-12-08 DIAGNOSIS — Z9119 Patient's noncompliance with other medical treatment and regimen: Secondary | ICD-10-CM | POA: Diagnosis present

## 2014-12-08 DIAGNOSIS — E1165 Type 2 diabetes mellitus with hyperglycemia: Secondary | ICD-10-CM | POA: Diagnosis not present

## 2014-12-08 DIAGNOSIS — R7881 Bacteremia: Secondary | ICD-10-CM

## 2014-12-08 DIAGNOSIS — R000 Tachycardia, unspecified: Secondary | ICD-10-CM | POA: Diagnosis present

## 2014-12-08 DIAGNOSIS — Z79899 Other long term (current) drug therapy: Secondary | ICD-10-CM

## 2014-12-08 DIAGNOSIS — E876 Hypokalemia: Secondary | ICD-10-CM | POA: Insufficient documentation

## 2014-12-08 HISTORY — DX: Endocarditis, valve unspecified: I38

## 2014-12-08 LAB — COMPREHENSIVE METABOLIC PANEL
ALBUMIN: 2.5 g/dL — AB (ref 3.5–5.0)
ALT: 37 U/L (ref 17–63)
ANION GAP: 9 (ref 5–15)
AST: 16 U/L (ref 15–41)
Alkaline Phosphatase: 161 U/L — ABNORMAL HIGH (ref 38–126)
BILIRUBIN TOTAL: 0.1 mg/dL — AB (ref 0.3–1.2)
BUN: 12 mg/dL (ref 6–20)
CO2: 27 mmol/L (ref 22–32)
CREATININE: 0.91 mg/dL (ref 0.61–1.24)
Calcium: 8.5 mg/dL — ABNORMAL LOW (ref 8.9–10.3)
Chloride: 101 mmol/L (ref 101–111)
GFR calc Af Amer: >60 mL/min (ref >60)
GFR calc non Af Amer: >60 mL/min (ref >60)
Glucose, Bld: 235 mg/dL — ABNORMAL HIGH (ref 65–99)
Potassium: 3.8 mmol/L (ref 3.5–5.1)
Sodium: 137 mmol/L (ref 135–145)
Total Protein: 7.2 g/dL (ref 6.5–8.1)

## 2014-12-08 LAB — CBC WITH DIFFERENTIAL/PLATELET
BASOS PCT: 1 % (ref 0–1)
Basophils Absolute: 0.1 10*3/uL (ref 0.0–0.1)
EOS ABS: 0.3 10*3/uL (ref 0.0–0.7)
Eosinophils Relative: 3 % (ref 0–5)
HEMATOCRIT: 25.5 % — AB (ref 39.0–52.0)
HEMOGLOBIN: 8 g/dL — AB (ref 13.0–17.0)
Lymphocytes Relative: 18 % (ref 12–46)
Lymphs Abs: 2 10*3/uL (ref 0.7–4.0)
MCH: 25.8 pg — ABNORMAL LOW (ref 26.0–34.0)
MCHC: 31.4 g/dL (ref 30.0–36.0)
MCV: 82.3 fL (ref 78.0–100.0)
MONOS PCT: 9 % (ref 3–12)
Monocytes Absolute: 1 10*3/uL (ref 0.1–1.0)
NEUTROS ABS: 7.6 10*3/uL (ref 1.7–7.7)
Neutrophils Relative %: 69 % (ref 43–77)
Platelets: 415 10*3/uL — ABNORMAL HIGH (ref 150–400)
RBC: 3.1 MIL/uL — ABNORMAL LOW (ref 4.22–5.81)
RDW: 15.1 % (ref 11.5–15.5)
WBC: 10.9 10*3/uL — ABNORMAL HIGH (ref 4.0–10.5)

## 2014-12-08 LAB — GLUCOSE, CAPILLARY: GLUCOSE-CAPILLARY: 249 mg/dL — AB (ref 65–99)

## 2014-12-08 LAB — GLUCOSE, POCT (MANUAL RESULT ENTRY): POC GLUCOSE: 284 mg/dL — AB (ref 70–99)

## 2014-12-08 LAB — LACTIC ACID, PLASMA: Lactic Acid, Venous: 0.5 mmol/L (ref 0.5–2.0)

## 2014-12-08 MED ORDER — PANTOPRAZOLE SODIUM 40 MG PO TBEC
40.0000 mg | DELAYED_RELEASE_TABLET | Freq: Every day | ORAL | Status: DC
  Administered 2014-12-09 – 2014-12-10 (×2): 40 mg via ORAL
  Filled 2014-12-08 (×2): qty 1

## 2014-12-08 MED ORDER — LISINOPRIL 5 MG PO TABS
5.0000 mg | ORAL_TABLET | Freq: Every day | ORAL | Status: DC
  Administered 2014-12-08 – 2014-12-10 (×3): 5 mg via ORAL
  Filled 2014-12-08 (×3): qty 1

## 2014-12-08 MED ORDER — GLIMEPIRIDE 2 MG PO TABS
2.0000 mg | ORAL_TABLET | Freq: Every day | ORAL | Status: DC
  Administered 2014-12-09 – 2014-12-10 (×2): 2 mg via ORAL
  Filled 2014-12-08 (×3): qty 1

## 2014-12-08 MED ORDER — ALUM & MAG HYDROXIDE-SIMETH 200-200-20 MG/5ML PO SUSP: 30.0000 mL | Freq: Four times a day (QID) | ORAL | Status: DC | PRN

## 2014-12-08 MED ORDER — HEPARIN SODIUM (PORCINE) 5000 UNIT/ML IJ SOLN
5000.0000 [IU] | Freq: Three times a day (TID) | INTRAMUSCULAR | Status: DC
  Administered 2014-12-08 – 2014-12-10 (×5): 5000 [IU] via SUBCUTANEOUS
  Filled 2014-12-08 (×8): qty 1

## 2014-12-08 MED ORDER — INSULIN ASPART 100 UNIT/ML ~~LOC~~ SOLN
0.0000 [IU] | Freq: Three times a day (TID) | SUBCUTANEOUS | Status: DC
Start: 2014-12-09 — End: 2014-12-10
  Administered 2014-12-09 (×2): 3 [IU] via SUBCUTANEOUS

## 2014-12-08 MED ORDER — GLUCOSE BLOOD VI STRP: 1.0000 | ORAL_STRIP | Freq: Three times a day (TID) | Status: DC

## 2014-12-08 MED ORDER — VANCOMYCIN HCL 1000 MG IV SOLR
1000.0000 mg | Freq: Three times a day (TID) | INTRAVENOUS | Status: DC
  Administered 2014-12-09: 1000 mg via INTRAVENOUS
  Filled 2014-12-08 (×2): qty 1000

## 2014-12-08 MED ORDER — AMLODIPINE BESYLATE 10 MG PO TABS: 10.0000 mg | ORAL_TABLET | Freq: Every day | ORAL | Status: DC

## 2014-12-08 MED ORDER — ACETAMINOPHEN 650 MG RE SUPP: 650.0000 mg | Freq: Four times a day (QID) | RECTAL | Status: DC | PRN

## 2014-12-08 MED ORDER — ONDANSETRON HCL 4 MG PO TABS: 4.0000 mg | ORAL_TABLET | Freq: Four times a day (QID) | ORAL | Status: DC | PRN

## 2014-12-08 MED ORDER — SODIUM CHLORIDE 0.9 % IV SOLN
1500.0000 mg | INTRAVENOUS | Status: AC
  Administered 2014-12-08: 1500 mg via INTRAVENOUS
  Filled 2014-12-08: qty 1500

## 2014-12-08 MED ORDER — TRUE METRIX METER W/DEVICE KIT: 1.0000 | PACK | Freq: Three times a day (TID) | Status: DC

## 2014-12-08 MED ORDER — INSULIN GLARGINE 100 UNIT/ML ~~LOC~~ SOLN
35.0000 [IU] | Freq: Every day | SUBCUTANEOUS | Status: DC
  Administered 2014-12-08 – 2014-12-09 (×2): 35 [IU] via SUBCUTANEOUS
  Filled 2014-12-08 (×3): qty 0.35

## 2014-12-08 MED ORDER — AMLODIPINE BESYLATE 10 MG PO TABS
10.0000 mg | ORAL_TABLET | Freq: Every day | ORAL | Status: DC
  Administered 2014-12-08 – 2014-12-10 (×3): 10 mg via ORAL
  Filled 2014-12-08 (×3): qty 1

## 2014-12-08 MED ORDER — FOLIC ACID 1 MG PO TABS
1.0000 mg | ORAL_TABLET | Freq: Every day | ORAL | Status: DC
  Administered 2014-12-08 – 2014-12-10 (×3): 1 mg via ORAL
  Filled 2014-12-08 (×3): qty 1

## 2014-12-08 MED ORDER — SODIUM CHLORIDE 0.9 % IV BOLUS (SEPSIS)
1000.0000 mL | Freq: Once | INTRAVENOUS | Status: AC
  Administered 2014-12-08: 1000 mL via INTRAVENOUS

## 2014-12-08 MED ORDER — ONDANSETRON HCL 4 MG/2ML IJ SOLN: 4.0000 mg | Freq: Four times a day (QID) | INTRAMUSCULAR | Status: DC | PRN

## 2014-12-08 MED ORDER — INSULIN ASPART 100 UNIT/ML ~~LOC~~ SOLN
0.0000 [IU] | Freq: Every day | SUBCUTANEOUS | Status: DC
  Administered 2014-12-08: 2 [IU] via SUBCUTANEOUS

## 2014-12-08 MED ORDER — TAMSULOSIN HCL 0.4 MG PO CAPS
0.4000 mg | ORAL_CAPSULE | Freq: Every day | ORAL | Status: DC
  Administered 2014-12-09: 0.4 mg via ORAL
  Filled 2014-12-08 (×2): qty 1

## 2014-12-08 MED ORDER — ACETAMINOPHEN 325 MG PO TABS
650.0000 mg | ORAL_TABLET | Freq: Four times a day (QID) | ORAL | Status: DC | PRN
  Administered 2014-12-10: 650 mg via ORAL
  Filled 2014-12-08: qty 2

## 2014-12-08 MED ORDER — LISINOPRIL 5 MG PO TABS: 5.0000 mg | ORAL_TABLET | Freq: Every day | ORAL | Status: DC

## 2014-12-08 MED ORDER — TRUEPLUS LANCETS 30G MISC: 1.0000 | Freq: Three times a day (TID) | Status: DC

## 2014-12-08 NOTE — Telephone Encounter (Signed)
Met with Carolynn Sayers, RN w/ [facility] IV Team who had been working with the patient while he was hospitalized to prepare him for receiving his infusion at home.  She noted that despite extensive teaching with the patient, it was determined that he was not able to safely administer the infusion himself.  She also noted the difficulties securing a caregiver to safely administer the medication for the patient as the patient did not want to go to a SNF.  He was discharged from the hospital with the plan for him to stay with his mother, who was to administer the medication;  but he did not stay with her. Instead, he went to stay with his friend, Mickel Baas, who was not agreeable to administering the medication for the patient.  [facility] [place] noted that the hospital was notified that [facility] would not be servicing the patient. There was no caregiver at home that was trained and willing to admin the infusion.

## 2014-12-08 NOTE — ED Provider Notes (Signed)
CSN: [id number]     Arrival date & time 12/08/14  1645 History   First MD Initiated Contact with Patient 12/08/14 1705     Chief Complaint  Patient presents with  . IV Medication     (Consider location/radiation/quality/duration/timing/severity/associated sxs/prior Treatment) Patient is a 54 y.o. male presenting with general illness. The history is limited by the absence of a caregiver.  Illness Location:  MRSA endocarditis with septic emboli, recently hospitalized and discharged Quality:  Pt was to go to mom's for infusion of abx however went to friend's house and now does not have anyone comfortable with doing infusions and was sent by transitional care Onset quality:  Gradual Duration:  3 days (without abx) Timing:  Constant Context:  Sent by transitional care clinic Relieved by:  Nothing Associated symptoms: nausea   Associated symptoms: no abdominal pain, no chest pain, no cough, no diarrhea, no fever, no headaches, no loss of consciousness, no rash, no shortness of breath, no sore throat, no vomiting and no wheezing     Past Medical History  Diagnosis Date  . Diabetes mellitus without complication   . Hypertension   . Endocarditis    Past Surgical History  Procedure Laterality Date  . No past surgeries    . Video bronchoscopy Bilateral 12/03/2014    Procedure: VIDEO BRONCHOSCOPY WITHOUT FLUORO;  Surgeon: Chesley Mires, MD;  Location: [facility];  Service: Cardiopulmonary;  Laterality: Bilateral;   Family History  Problem Relation Age of Onset  . Hypertension Mother   . Diabetes Sister   . Diabetes Brother    History  Substance Use Topics  . Smoking status: Never Smoker   . Smokeless tobacco: Never Used  . Alcohol Use: No     Comment: patient states he does not drink on 12/08/14    Review of Systems  Constitutional: Positive for chills. Negative for fever.  HENT: Negative for sore throat.   Eyes: Negative for visual disturbance.  Respiratory: Negative for  cough, shortness of breath and wheezing.   Cardiovascular: Negative for chest pain.  Gastrointestinal: Positive for nausea. Negative for vomiting, abdominal pain and diarrhea.  Genitourinary: Negative for difficulty urinating.  Musculoskeletal: Negative for back pain and neck stiffness.  Skin: Negative for rash.  Neurological: Negative for loss of consciousness, syncope and headaches.      Allergies  Review of patient's allergies indicates no known allergies.  Home Medications   Prior to Admission medications   Medication Sig Start Date End Date Taking? Authorizing Provider  acetaminophen (TYLENOL) 500 MG tablet Take 500 mg by mouth every 6 (six) hours as needed for mild pain.   Yes Historical Provider, MD  Blood Glucose Monitoring Suppl (TRUE METRIX METER) W/DEVICE KIT 1 each by Does not apply route 4 (four) times daily - after meals and at bedtime. 12/08/14  Yes Arnoldo Morale, MD  folic acid (FOLVITE) 1 MG tablet Take 1 tablet (1 mg total) by mouth daily. 11/22/14  Yes Robbie Lis, MD  glucose blood test strip 1 each by Other route 4 (four) times daily -  before meals and at bedtime. Use as instructed 12/08/14  Yes Arnoldo Morale, MD  pantoprazole (PROTONIX) 40 MG tablet Take 1 tablet (40 mg total) by mouth daily. 11/22/14  Yes Robbie Lis, MD  tamsulosin (FLOMAX) 0.4 MG CAPS capsule Take 1 capsule (0.4 mg total) by mouth daily after supper. 11/22/14  Yes Robbie Lis, MD  TRUEPLUS LANCETS 30G MISC 1 each by Does not apply  route 4 (four) times daily - after meals and at bedtime. 12/08/14  Yes Arnoldo Morale, MD  alum & mag hydroxide-simeth (MAALOX/MYLANTA) 200-200-20 MG/5ML suspension Take 30 mLs by mouth every 6 (six) hours as needed for indigestion or heartburn. Patient not taking: Reported on 11/30/2014 11/22/14   Robbie Lis, MD  amLODipine (NORVASC) 10 MG tablet Take 1 tablet (10 mg total) by mouth daily. Patient not taking: Reported on 12/08/2014 12/08/14   Arnoldo Morale, MD  glimepiride  (AMARYL) 2 MG tablet Take 1 tablet (2 mg total) by mouth daily with breakfast. Patient not taking: Reported on 12/08/2014 12/04/14   Velta Addison Mikhail, DO  glucose blood test strip Use as instructed Patient not taking: Reported on 12/08/2014 11/22/14   Robbie Lis, MD  glucose blood test strip Use as instructed Patient not taking: Reported on 12/08/2014 12/07/14   Arnoldo Morale, MD  insulin aspart (NOVOLOG) 100 UNIT/ML injection Inject 7 Units into the skin 3 (three) times daily with meals. Patient not taking: Reported on 11/30/2014 11/22/14   Robbie Lis, MD  insulin glargine (LANTUS) 100 UNIT/ML injection Inject 0.35 mLs (35 Units total) into the skin at bedtime. Patient not taking: Reported on 11/30/2014 11/22/14   Robbie Lis, MD  lisinopril (PRINIVIL,ZESTRIL) 5 MG tablet Take 1 tablet (5 mg total) by mouth daily. Patient not taking: Reported on 12/08/2014 12/08/14   Arnoldo Morale, MD  Multiple Vitamin (MULTIVITAMIN WITH MINERALS) TABS tablet Take 1 tablet by mouth daily. Patient not taking: Reported on 11/30/2014 11/22/14   Robbie Lis, MD  thiamine 100 MG tablet Take 1 tablet (100 mg total) by mouth daily. Patient not taking: Reported on 12/08/2014 11/22/14   Robbie Lis, MD  vancomycin (VANCOCIN) 1 GM/200ML SOLN Inject 300 mLs (1,500 mg total) into the vein every 12 (twelve) hours. Per home health protocol Patient not taking: Reported on 12/08/2014 12/04/14   Maryann Mikhail, DO   BP 132/68 mmHg  Pulse 98  Temp(Src) 98.7 F (37.1 C) (Oral)  Resp 18  Ht 5' 11"  (1.803 m)  Wt 170 lb 11.2 oz (77.429 kg)  BMI 23.82 kg/m2  SpO2 98% Physical Exam  Constitutional: He is oriented to person, place, and time. He appears well-developed and well-nourished. No distress.  HENT:  Head: Normocephalic and atraumatic.  Eyes: Conjunctivae and EOM are normal.  Neck: Normal range of motion.  Cardiovascular: Normal rate, regular rhythm, normal heart sounds and intact distal pulses.  Exam reveals no gallop and no  friction rub.   No murmur heard. Pulmonary/Chest: Effort normal and breath sounds normal. No respiratory distress. He has no wheezes. He has no rales.  Abdominal: Soft. He exhibits no distension. There is no tenderness. There is no guarding.  Musculoskeletal: He exhibits no edema.  Neurological: He is alert and oriented to person, place, and time.  Skin: Skin is warm and dry. He is not diaphoretic.  PICC line right arm   Nursing note and vitals reviewed.   ED Course  Procedures (including critical care time) Labs Review Labs Reviewed  CBC WITH DIFFERENTIAL/PLATELET - Abnormal; Notable for the following:    WBC 10.9 (*)    RBC 3.10 (*)    Hemoglobin 8.0 (*)    HCT 25.5 (*)    MCH 25.8 (*)    Platelets 415 (*)    All other components within normal limits  COMPREHENSIVE METABOLIC PANEL - Abnormal; Notable for the following:    Glucose, Bld 235 (*)  Calcium 8.5 (*)    Albumin 2.5 (*)    Alkaline Phosphatase 161 (*)    Total Bilirubin 0.1 (*)    All other components within normal limits  BASIC METABOLIC PANEL - Abnormal; Notable for the following:    Potassium 3.4 (*)    Chloride 100 (*)    Glucose, Bld 125 (*)    Calcium 8.5 (*)    All other components within normal limits  CBC - Abnormal; Notable for the following:    RBC 3.19 (*)    Hemoglobin 8.3 (*)    HCT 26.2 (*)    Platelets 459 (*)    All other components within normal limits  GLUCOSE, CAPILLARY - Abnormal; Notable for the following:    Glucose-Capillary 249 (*)    All other components within normal limits  GLUCOSE, CAPILLARY - Abnormal; Notable for the following:    Glucose-Capillary 169 (*)    All other components within normal limits  GLUCOSE, CAPILLARY - Abnormal; Notable for the following:    Glucose-Capillary 166 (*)    All other components within normal limits  CULTURE, BLOOD (ROUTINE X 2)  CULTURE, BLOOD (ROUTINE X 2)  LACTIC ACID, PLASMA    Imaging Review No results found.   EKG  Interpretation None      MDM   Final diagnoses:  Endocarditis  Non-compliance with treatment   54 yo male with history of htn, DM, recent admission for endocarditis with MRSA bacteremia, septic emboli who was unable to continue abx since discharge 7/15 presents from PCP office for concern for pt requiring IV abx and no safe plan in place at home.  Pt mildly tachycardic, however otherwise stable, well appearing.  Blood dx drawn and IV abx initiated. Pt admitted for further care.    Gareth Morgan, MD 12/09/14 1510

## 2014-12-08 NOTE — ED Notes (Signed)
Called unit to give report. Secretary stated nurse will call.

## 2014-12-08 NOTE — ED Notes (Signed)
Pt reports he was [facility]x for endocarditis.  PICC line noted in his R Upper arm.

## 2014-12-08 NOTE — Progress Notes (Signed)
[facility] ED Cm contacted by Baxter HireKristen [facility] to [facility] with request to provide iv antibiotics for endocarditis. [facility] had difficulty contacting and initiating home services During last hospitalization pt changed mind on whom would be his support system/primary care giver and where he would reside after d/c  2022 Cm left message for medical director to updated on pt return to ED - Noted observation admission status

## 2014-12-08 NOTE — ED Notes (Signed)
Unable to collect labs at this time MD talking with patient. 

## 2014-12-08 NOTE — Progress Notes (Signed)
Quick Note:  Labs addressed at office as well as medications and patient was made aware. ______ 

## 2014-12-08 NOTE — Progress Notes (Signed)
Patient ID: Marc [name], male   DOB: Dec 15, 1960, 54 y.o.   MRN: [id number]    Marc [name], is a 55 y.o. male   [facility]  Date of telephone encounter: 12/07/2014  Admit date: 11/30/14 Discharge date: 12/04/14  PCP: None  PIR:[id number]  YTK:[id number]  DOB - 1961-01-29  CC:  Chief Complaint  Patient presents with  . Endocarditis  . Hypertension  . Diabetes       HPI: Please refer to notes from - Marc [name] (case manager) from today regarding this patient's care. Marc [name] is a 54 y.o. male with a history of uncontrolled type 2 diabetes mellitus (A1c 12.1), hypertension, urinary outlet obstruction, MRSA bacteremia with septic emboli, presumptive endocarditis who was discharged on 12/04/14 with a PICC line and was supposed to receive IV vancomycin every 12 hours which he never received and is in today to establish care at the transitional care clinic. He had presented to have his Foley catheter removed which had been inserted secondary to bladder outlet obstruction during his previous hospitalization in which he was treated for sepsis secondary to UTI and pneumonia. His blood culture grew MRSA and he never presented back to the hospital for direct admission after called by the hospitalist. On presentation he was found to be tachycardic, chest x-ray showed possible septic emboli and CT chest revealed multiple pulmonary nodules with cavitation compatible with septic emboli although differentials could include metastasis, TB or fungal infection, small right pleural effusion and so he was placed on empiric Vanc and Zosyn.  He had leukocytosis with WBC of 12 which trended down to 9.7 and was anemic with Hemoglobin of 10.9 which trended down to 8.4.His repeat blood cultures, urine cultures revealed no growth; AFB was negative, BAL revealed GPC on gram stain negative for fungus or yeast. 2-D echo revealed EF of 55-60%, no vegetation and TEE was to be scheduled as  an outpatient. He was seen by infectious disease during his stay; PICC line was placed and he was discharged on IV vancomycin to be continued. 6 more weeks..   Interval history:  The case manager from the clinic has been in contact with [facility] and has been informed that [facility] would no longer be servicing the patient due to difficulty securing a care giver to safely administer the medication and the patient refused going to a SNF and could not administer the infusion himself. He had initially stated he would be discharged home to live with his mother and then later decided it would be with a friend- Marc [name] and today he tells me he ended up going to his mom's. Marc [name] was not agreeable to administering the infusion.  The patient does not fully and grasp the gravity of the situation and only wants me to 'just take out the foley catheter'. He states he did not have time to sit under the hot sun and learn about the "crazy medication" to be administered while at the hospital as they were wasting his time. He is accompanied by his friend Marc [name] and has no other concerns at this time. Patient has No headache, No chest pain, No abdominal pain - No Nausea, No new weakness tingling or numbness, No Cough - SOB.  No Known Allergies Past Medical History  Diagnosis Date  . Diabetes mellitus without complication   . Hypertension    Current Outpatient Prescriptions on File Prior to Visit  Medication Sig Dispense Refill  . folic acid (FOLVITE) 1 MG tablet Take 1  tablet (1 mg total) by mouth daily. 30 tablet 0  . pantoprazole (PROTONIX) 40 MG tablet Take 1 tablet (40 mg total) by mouth daily. 30 tablet 0  . thiamine 100 MG tablet Take 1 tablet (100 mg total) by mouth daily. 30 tablet 0  . acetaminophen (TYLENOL) 500 MG tablet Take 500 mg by mouth every 6 (six) hours as needed for mild pain.    Marland Kitchen alum & mag hydroxide-simeth (MAALOX/MYLANTA) 200-200-20 MG/5ML suspension Take 30 mLs by mouth  every 6 (six) hours as needed for indigestion or heartburn. (Patient not taking: Reported on 11/30/2014) 355 mL 0  . glimepiride (AMARYL) 2 MG tablet Take 1 tablet (2 mg total) by mouth daily with breakfast. (Patient not taking: Reported on 12/08/2014) 30 tablet 0  . glucose blood test strip Use as instructed (Patient not taking: Reported on 11/30/2014) 100 each 0  . glucose blood test strip Use as instructed (Patient not taking: Reported on 12/08/2014) 100 each 12  . insulin aspart (NOVOLOG) 100 UNIT/ML injection Inject 7 Units into the skin 3 (three) times daily with meals. (Patient not taking: Reported on 11/30/2014) 10 mL 0  . insulin glargine (LANTUS) 100 UNIT/ML injection Inject 0.35 mLs (35 Units total) into the skin at bedtime. (Patient not taking: Reported on 11/30/2014) 10 mL 0  . Multiple Vitamin (MULTIVITAMIN WITH MINERALS) TABS tablet Take 1 tablet by mouth daily. (Patient not taking: Reported on 11/30/2014) 30 tablet 0  . tamsulosin (FLOMAX) 0.4 MG CAPS capsule Take 1 capsule (0.4 mg total) by mouth daily after supper. 30 capsule 0  . vancomycin (VANCOCIN) 1 GM/200ML SOLN Inject 300 mLs (1,500 mg total) into the vein every 12 (twelve) hours. Per home health protocol (Patient not taking: Reported on 12/08/2014) 4000 mL 0   No current facility-administered medications on file prior to visit.   Family History  Problem Relation Age of Onset  . Hypertension Mother   . Diabetes Sister   . Diabetes Brother    History   Social History  . Marital Status: Single    Spouse Name: N/A  . Number of Children: N/A  . Years of Education: N/A   Occupational History  . Not on file.   Social History Main Topics  . Smoking status: Never Smoker   . Smokeless tobacco: Never Used  . Alcohol Use: No     Comment: patient states he does not drink on 12/08/14  . Drug Use: No  . Sexual Activity: Not on file   Other Topics Concern  . Not on file   Social History Narrative    Review of  Systems: Constitutional: Negative for fever, chills, diaphoresis, activity change, appetite change and fatigue. HENT: Negative for ear pain, nosebleeds, congestion, facial swelling, rhinorrhea, neck pain, neck stiffness and ear discharge.  Eyes: Negative for pain, discharge, redness, itching and visual disturbance. Respiratory: Negative for cough, choking, chest tightness, shortness of breath, wheezing and stridor.  Cardiovascular: Negative for chest pain, palpitations and leg swelling. Gastrointestinal: Negative for abdominal distention. Genitourinary:positive for lower urinary tract symptoms Musculoskeletal: Negative for back pain, joint swelling, arthralgia and gait problem. Neurological: Negative for dizziness, tremors, seizures, syncope, facial asymmetry, speech difficulty, weakness, light-headedness, numbness and headaches.  Hematological: Negative for adenopathy. Does not bruise/bleed easily. Skin: Negative for rash, ulcer. Psychiatric/Behavioral: Negative for hallucinations, behavioral problems, confusion, dysphoric mood, decreased concentration and agitation.    Objective:   Filed Vitals:   12/08/14 1553  BP: 166/86  Pulse: 116  Temp: 99 F (  37.2 C)    Physical Exam: Constitutional: Patient appears well-developed and well-nourished. No distress. HENT: Normocephalic, atraumatic, External right and left ear normal. Oropharynx is clear and moist.  Eyes: Conjunctivae and EOM are normal. PERRLA, no scleral icterus. Neck: Normal ROM, No JVD. No tracheal deviation. No thyromegaly. CVS: Tachycardic, S1/S2 +, no murmurs, no gallops, no carotid bruit.  Pulmonary: Effort and breath sounds normal, no stridor, rhonchi, wheezes, rales.  Abdominal: Soft. BS +, no distension, tenderness, rebound or guarding.  Musculoskeletal: Normal range of motion. No edema and no tenderness.  Genito- urinary: Foley catheter in place  Lymphadenopathy: No lymphadenopathy noted, cervical, inguinal or  axillary Neuro: Alert. Normal reflexes, muscle tone coordination. No cranial nerve deficit. Skin: Skin is warm and dry. No rash noted. Not diaphoretic. No erythema. No pallor. Psychiatric: Normal mood and affect. Poor judgment.  Lab Results  Component Value Date   WBC 9.7 12/04/2014   HGB 8.4* 12/04/2014   HCT 26.4* 12/04/2014   MCV 82.2 12/04/2014   PLT 339 12/04/2014   Lab Results  Component Value Date   CREATININE 0.90 12/04/2014   BUN 12 12/04/2014   NA 136 12/04/2014   K 3.4* 12/04/2014   CL 102 12/04/2014   CO2 27 12/04/2014    Lab Results  Component Value Date   HGBA1C 12.1* 11/16/2014     CLINICAL DATA: Recent diagnosis of urinary tract infection 2 weeks ago. Now developing right lateral chest pain since this morning. Plain radiographs demonstrated increase nodular infiltrates.  EXAM: CT CHEST WITH CONTRAST  TECHNIQUE: Multidetector CT imaging of the chest was performed during intravenous contrast administration.  CONTRAST: 80mL OMNIPAQUE IOHEXOL 300 MG/ML SOLN  COMPARISON: Chest 11/30/2014  FINDINGS: Multiple diffuse nodular areas of consolidation throughout both lungs, many with cavitation. Largest lesion is present in the superior segment left lower lobe and measures about 1.8 x 2.3 cm. Appearance is compatible with septic emboli although nonspecific. Short-term follow-up after resolution of acute process is recommended to exclude metastasis. Differential diagnosis would also include Wegner's granulomatosis or atypical infection such as TB or fungal infection. Small right pleural effusion. No pneumothorax.  Normal heart size. Normal caliber thoracic aorta. Scattered mediastinal and hilar lymph nodes as well as axillary lymph nodes are present without pathologic enlargement. These are likely reactive. Small esophageal hiatal hernia. Esophagus is mostly decompressed.  Included portions of the upper abdominal organs are  grossly unremarkable. No destructive bone lesions.  IMPRESSION: Multiple pulmonary nodules bilaterally, many with cavitation. Appearance is compatible with septic emboli although differential diagnosis would include metastasis, TB or fungal infection. Small right pleural effusion.   Electronically Signed  By: Burman Nieves M.D.  On: 11/30/2014 22  Assessment and plan:  54 year old male with a history of uncontrolled type 2 diabetes mellitus (A1c 12.1), hypertension, urinary outlet obstruction, non compliance with medical treatment, MRSA bacteremia with septic emboli, presumptive endocarditis who was discharged recently but is yet to commence IV vancomycin due to lack of caregiver willing to administer infusion.   MRSA bacteremia/ Septic embolism/ presumptive endocarditis: Currently tachycardic, otherwise stable. This patient is a noncompliant and cannot be trusted to come in as an outpatient to the infusion center for IV medications every 12 hours and does not comprehend the implications of his actions even though I have explained to him the severe consequences I spoke with Dr. Earnie Larsson of infectious disease given he is yet to receive treatment for septic emboli and presumptive endocarditis and [facility] has signed off  on him and obtaining another Homecare agency in the outpatient setting would be a challenge given he has no insurance and so he would be referred back to the hospital to receive his antibiotics and set up with a home care agency given he is high risk.  Controlled type 2 diabetes mellitus: A1c of 12.1 due to noncompliance. Recently picked up hisGlimepride the pharmacy which I have advised him to start taking I will review his blood sugar log at his next office visit  Hypertension: Uncontrolled Review of his medications indicates amlodipine is missing even though it appears on his medication list. I am adding lisinopril for renal protection as  well. Reassess blood pressure at his next visit.  Bladder outlet obstruction: He is so irritated about the fact that he has a Foley in place and that is all he is concerned about. He does have an upcoming appointment with urology on have advised him to keep that meal continue Flomax.  Hypokalemia: I would love to repeat his BMET given he is being referred to the hospital. Be done there.   This note has been created with Education officer, environmentalDragon speech recognition software and smart phrase technology. Any transcriptional errors are unintentional.    Jaclyn ShaggyEnobong, Amao, MD. [facility] [phone] 12/08/2014, 4:25 PM

## 2014-12-08 NOTE — H&P (Signed)
PCP:   No PCP Per Patient   Chief Complaint:  MRSA endocarditis  HPI: This is a 54 year old gentleman admitted with MRSA endocarditis. He was discharged with home health to assist with IV antibiotics outpatient. The patient states that he got annoyed with the home health services [facility] not to return. He has not received IV vancomycin since of 15 94 days now). He states he has had low-grade fever. Today he felt weak and had difficulty walking because of leg discomfort. He decided to come back to the ER. He states he has a niece who is willing to help him administer his IV antibiotics because she is good with giving medication, she is a [employer] manager. He re-presents to the ER so new arrangements can be made and she can be taught how to give the antibiotics. History provided by patient.  Patient request not to be placed on a diabetic diet.  Review of Systems:  The patient denies anorexia, fever, weight loss, vision loss, decreased hearing, hoarseness, chest pain, syncope, dyspnea on exertion, peripheral edema, balance deficits, hemoptysis, abdominal pain, melena, hematochezia, severe indigestion/heartburn, hematuria, incontinence, genital sores, muscle weakness, suspicious skin lesions, transient blindness, depression, unusual weight change, abnormal bleeding, enlarged lymph nodes, angioedema, and breast masses.  Past Medical History: Past Medical History  Diagnosis Date  . Diabetes mellitus without complication   . Hypertension   . Endocarditis    Past Surgical History  Procedure Laterality Date  . No past surgeries    . Video bronchoscopy Bilateral 12/03/2014    Procedure: VIDEO BRONCHOSCOPY WITHOUT FLUORO;  Surgeon: Chesley Mires, MD;  Location: [facility];  Service: Cardiopulmonary;  Laterality: Bilateral;    Medications: Prior to Admission medications   Medication Sig Start Date End Date Taking? Authorizing Provider  acetaminophen (TYLENOL) 500 MG tablet Take 500 mg by mouth every 6 (six)  hours as needed for mild pain.   Yes Historical Provider, MD  Blood Glucose Monitoring Suppl (TRUE METRIX METER) W/DEVICE KIT 1 each by Does not apply route 4 (four) times daily - after meals and at bedtime. 12/08/14  Yes Arnoldo Morale, MD  folic acid (FOLVITE) 1 MG tablet Take 1 tablet (1 mg total) by mouth daily. 11/22/14  Yes Robbie Lis, MD  glucose blood test strip 1 each by Other route 4 (four) times daily -  before meals and at bedtime. Use as instructed 12/08/14  Yes Arnoldo Morale, MD  pantoprazole (PROTONIX) 40 MG tablet Take 1 tablet (40 mg total) by mouth daily. 11/22/14  Yes Robbie Lis, MD  tamsulosin (FLOMAX) 0.4 MG CAPS capsule Take 1 capsule (0.4 mg total) by mouth daily after supper. 11/22/14  Yes Robbie Lis, MD  TRUEPLUS LANCETS 30G MISC 1 each by Does not apply route 4 (four) times daily - after meals and at bedtime. 12/08/14  Yes Arnoldo Morale, MD  alum & mag hydroxide-simeth (MAALOX/MYLANTA) 200-200-20 MG/5ML suspension Take 30 mLs by mouth every 6 (six) hours as needed for indigestion or heartburn. Patient not taking: Reported on 11/30/2014 11/22/14   Robbie Lis, MD  amLODipine (NORVASC) 10 MG tablet Take 1 tablet (10 mg total) by mouth daily. Patient not taking: Reported on 12/08/2014 12/08/14   Arnoldo Morale, MD  glimepiride (AMARYL) 2 MG tablet Take 1 tablet (2 mg total) by mouth daily with breakfast. Patient not taking: Reported on 12/08/2014 12/04/14   Velta Addison Mikhail, DO  glucose blood test strip Use as instructed Patient not taking: Reported on 12/08/2014 11/22/14  Robbie Lis, MD  glucose blood test strip Use as instructed Patient not taking: Reported on 12/08/2014 12/07/14   Arnoldo Morale, MD  insulin aspart (NOVOLOG) 100 UNIT/ML injection Inject 7 Units into the skin 3 (three) times daily with meals. Patient not taking: Reported on 11/30/2014 11/22/14   Robbie Lis, MD  insulin glargine (LANTUS) 100 UNIT/ML injection Inject 0.35 mLs (35 Units total) into the skin at  bedtime. Patient not taking: Reported on 11/30/2014 11/22/14   Robbie Lis, MD  lisinopril (PRINIVIL,ZESTRIL) 5 MG tablet Take 1 tablet (5 mg total) by mouth daily. Patient not taking: Reported on 12/08/2014 12/08/14   Arnoldo Morale, MD  Multiple Vitamin (MULTIVITAMIN WITH MINERALS) TABS tablet Take 1 tablet by mouth daily. Patient not taking: Reported on 11/30/2014 11/22/14   Robbie Lis, MD  thiamine 100 MG tablet Take 1 tablet (100 mg total) by mouth daily. Patient not taking: Reported on 12/08/2014 11/22/14   Robbie Lis, MD  vancomycin (VANCOCIN) 1 GM/200ML SOLN Inject 300 mLs (1,500 mg total) into the vein every 12 (twelve) hours. Per home health protocol Patient not taking: Reported on 12/08/2014 12/04/14   Cristal Ford, DO    Allergies:  No Known Allergies  Social History:  reports that he has never smoked. He has never used smokeless tobacco. He reports that he does not drink alcohol or use illicit drugs.  Family History: Family History  Problem Relation Age of Onset  . Hypertension Mother   . Diabetes Sister   . Diabetes Brother     Physical Exam: Filed Vitals:   12/08/14 1900 12/08/14 1910 12/08/14 2000 12/08/14 2019  BP:  151/88 157/78 157/78  Pulse:  102 106 102  Temp:    99.3 F (37.4 C)  TempSrc:    Oral  Resp:  16 20 24   Weight: 83.915 kg (185 lb)     SpO2:  96% 100% 98%    General:  Alert and oriented times three, well developed and nourished, no acute distress Eyes: PERRLA, pink conjunctiva, no scleral icterus ENT: Moist oral mucosa, neck supple, no thyromegaly Lungs: clear to ascultation, no wheeze, no crackles, no use of accessory muscles Cardiovascular: regular rate and rhythm, no regurgitation, no gallops, no murmurs. No carotid bruits, no JVD Abdomen: soft, positive BS, non-tender, non-distended, no organomegaly, not an acute abdomen GU: not examined Neuro: CN II - XII grossly intact, sensation intact Musculoskeletal: strength 5/5 all extremities, no  clubbing, cyanosis or edema Skin: no rash, no subcutaneous crepitation, no decubitus Psych: appropriate patient   Labs on Admission:   Recent Labs  12/08/14 1820  NA 137  K 3.8  CL 101  CO2 27  GLUCOSE 235*  BUN 12  CREATININE 0.91  CALCIUM 8.5*    Recent Labs  12/08/14 1820  AST 16  ALT 37  ALKPHOS 161*  BILITOT 0.1*  PROT 7.2  ALBUMIN 2.5*   No results for input(s): LIPASE, AMYLASE in the last 72 hours.  Recent Labs  12/08/14 1820  WBC 10.9*  NEUTROABS 7.6  HGB 8.0*  HCT 25.5*  MCV 82.3  PLT 415*   No results for input(s): CKTOTAL, CKMB, CKMBINDEX, TROPONINI in the last 72 hours. Invalid input(s): POCBNP No results for input(s): DDIMER in the last 72 hours. No results for input(s): HGBA1C in the last 72 hours. No results for input(s): CHOL, HDL, LDLCALC, TRIG, CHOLHDL, LDLDIRECT in the last 72 hours. No results for input(s): TSH, T4TOTAL, T3FREE, THYROIDAB in the  last 72 hours.  Invalid input(s): FREET3 No results for input(s): VITAMINB12, FOLATE, FERRITIN, TIBC, IRON, RETICCTPCT in the last 72 hours.  Micro Results: Recent Results (from the past 240 hour(s))  Blood culture (routine x 2)     Status: None   Collection Time: 11/30/14  8:56 PM  Result Value Ref Range Status   Specimen Description BLOOD RIGHT HAND  Final   Special Requests BOTTLES DRAWN AEROBIC AND ANAEROBIC 10CC EA  Final   Culture   Final    NO GROWTH 5 DAYS Performed at [facility]    Report Status 12/05/2014 FINAL  Final  Blood culture (routine x 2)     Status: None   Collection Time: 11/30/14  9:19 PM  Result Value Ref Range Status   Specimen Description BLOOD LEFT ANTECUBITAL  Final   Special Requests BOTTLES DRAWN AEROBIC AND ANAEROBIC 5CC EA  Final   Culture   Final    NO GROWTH 5 DAYS Performed at [facility]    Report Status 12/05/2014 FINAL  Final  Urine culture     Status: None   Collection Time: 11/30/14  9:57 PM  Result Value Ref Range Status    Specimen Description URINE, CATHETERIZED  Final   Special Requests NONE  Final   Culture   Final    NO GROWTH 2 DAYS Performed at [facility]    Report Status 12/02/2014 FINAL  Final  Culture, bal-quantitative     Status: None   Collection Time: 12/03/14  9:29 AM  Result Value Ref Range Status   Specimen Description BRONCHIAL ALVEOLAR LAVAGE  Final   Special Requests NONE  Final   Gram Stain   Final    RARE WBC PRESENT, PREDOMINANTLY MONONUCLEAR RARE SQUAMOUS EPITHELIAL CELLS PRESENT FEW GRAM POSITIVE COCCI IN PAIRS IN CLUSTERS RARE GRAM NEGATIVE RODS Performed at [place]   Final    75,000 COLONIES/ML Performed at [facility]    Culture   Final    Non-Pathogenic Oropharyngeal-type Flora Isolated. Performed at [facility]    Report Status 12/06/2014 FINAL  Final  AFB culture with smear     Status: None (Preliminary result)   Collection Time: 12/03/14  9:29 AM  Result Value Ref Range Status   Specimen Description BRONCHIAL ALVEOLAR LAVAGE  Final   Special Requests NONE  Final   Acid Fast Smear   Final    NO ACID FAST BACILLI SEEN Performed at [facility]    Culture   Final    CULTURE WILL BE EXAMINED FOR 6 WEEKS BEFORE ISSUING A FINAL REPORT Performed at [facility]    Report Status PENDING  Incomplete  Fungus Culture with Smear     Status: None (Preliminary result)   Collection Time: 12/03/14  9:29 AM  Result Value Ref Range Status   Specimen Description BRONCHIAL ALVEOLAR LAVAGE  Final   Special Requests NONE  Final   Fungal Smear   Final    NO YEAST OR FUNGAL ELEMENTS SEEN Performed at [facility]    Culture   Final    CANDIDA ALBICANS Performed at [facility]    Report Status PENDING  Incomplete     Radiological Exams on Admission: No results found.  Assessment/Plan Present on Admission:  . MRSA Endocarditis -Vancomycin resume, pharmacy to dose -Case  management consult for assisted with home IV antibiotics  -Please review patient's discharge summary and  outpatient plans  . DKA, type 2, not at goal -diabetic diet and SSI -lantus resumed . Essential hypertension -Stable medications resumed   Madilynne Mullan 12/08/2014, 8:57 PM

## 2014-12-08 NOTE — Progress Notes (Signed)
Patient here after being diagnosed with endocarditis and has been non-compliant with his 6 week vancomycin treatment His CBG is 284 today and although he picked up his medications yesterday has not taken his DM2 medications. He did state that he did not pick up his glucometer and supplies yesterday even though order was placed He reports no pain and no anxiety or depression His only request is that we remove his foley catheter today

## 2014-12-08 NOTE — Telephone Encounter (Signed)
Called the patient [phone] to remind him of his appointment this afternoon.  Voice mail message left w/ call back # [phone] or [phone].

## 2014-12-08 NOTE — Patient Instructions (Signed)
Infective Endocarditis  Infective endocarditis is an infection of the inner layer of heart (endocardium) or the heart valves. An endocarditis infection is caused by many things, including viruses, bacteria, or fungi that enter your bloodstream. If left untreated, endocarditis can cause other problems, which may include:  · Heart valve damage.  · Blood clots (embolism).  · Irregular heartbeat (arrhythmia).  · Heart failure.  CAUSES   Surgery, dental work, and intravenous (IV) drug abuse can allow bacteria or fungi to enter the bloodstream. Once in the bloodstream, bacteria can attach to the inner lining of the heart or heart valves, causing infection.  SYMPTOMS   · Fever and chills.  · Skin rashes.  · Heart murmurs.  · Spleen enlargement.  · Unexplained weight loss.  · Shortness of breath.  · Blood in urine.  · Lethargy and fatigue.  · Sudden weakness in face, arms, legs (possible stroke).  · Night sweats.  DIAGNOSIS   · Endocarditis may be suspected if your caregiver hears a heart murmur, observes certain skin rashes, or there are changes in your eye exam.  · Tests include:  ¨ Blood work.  ¨ Transthoracic echocardiogram (TTE). A TTE uses sound waves to produce images of the heart. A hand-held device is placed on the chest and transmits sound waves. These sound waves bounce off the heart to produce images that help your caregiver detect heart damage.  ¨ Transesophageal echocardiogram (TEE). For a TEE, a flexible tube is passed down your throat and into the esophagus. Because the esophagus is close to the heart, a TEE allows different images of the heart to be obtained. This type of procedure requires sedation.  WHO IS AT RISK FOR INFECTIVE ENDOCARDITIS?  · Those with a history of endocarditis.  · People with artificial (prosthetic) heart valves.  · Cardiac transplant patients with valve disease.  · People with congenital heart disease, including:  ¨ Those with unrepaired congenital heart defects.  ¨ Those with a  congenital heart defect repaired with prosthetic materials. Within the first 6 months of the procedure, you are still at risk for infection. After 6 months, talk to your caregiver about your special needs.  ¨ Those who have had surgery to correct the defect but with some remaining problems at the site of repair.  PREVENTION   If you are at the highest risk for infective endocarditis, you may need to take antibiotics before having dental work or other surgical procedures. These medicines help to prevent infective endocarditis. Let your dentist and your caregiver know if you have a history of any of the following so that the necessary precautions can be taken:  · A ventricular septal defect (VSD).  · A repaired VSD.  · Endocarditis in the past.  · A prosthetic heart valve.  TREATMENT   · IV antibiotic treatment may be needed for several weeks to treat endocarditis.  · Surgery may be needed to remove the infected tissue in the heart.  · Surgery to repair valve damage.  MAKE SURE YOU:   · Understand these instructions.  · Will watch your condition.  · Will get help right away if you are not doing well or get worse.  Document Released: 05/08/2005 Document Revised: 09/02/2012 Document Reviewed: 09/19/2006  ExitCare® Patient Information ©2015 [employer]. This information is not intended to replace advice given to you by your health care provider. Make sure you discuss any questions you have with your health care provider.

## 2014-12-08 NOTE — Progress Notes (Signed)
ANTIBIOTIC CONSULT NOTE - INITIAL  Pharmacy Consult for Vancomycin Indication: Endocarditis  No Known Allergies  Patient Measurements:   Adjusted Body Weight:   Vital Signs: Temp: 98.3 F (36.8 C) (07/19 1655) Temp Source: Oral (07/19 1655) BP: 156/90 mmHg (07/19 1655) Pulse Rate: 112 (07/19 1655) Intake/Output from previous day:   Intake/Output from this shift:    Labs: No results for input(s): WBC, HGB, PLT, LABCREA, CREATININE in the last 72 hours. Estimated Creatinine Clearance: 99.1 mL/min (by C-G formula based on Cr of 0.9). No results for input(s): VANCOTROUGH, VANCOPEAK, VANCORANDOM, GENTTROUGH, GENTPEAK, GENTRANDOM, TOBRATROUGH, TOBRAPEAK, TOBRARND, AMIKACINPEAK, AMIKACINTROU, AMIKACIN in the last 72 hours.   Microbiology: Recent Results (from the past 720 hour(s))  Urine culture     Status: None   Collection Time: 11/16/14 12:11 PM  Result Value Ref Range Status   Specimen Description URINE, CLEAN CATCH  Final   Special Requests NONE  Final   Culture   Final    MULTIPLE SPECIES PRESENT, SUGGEST RECOLLECTION IF CLINICALLY INDICATED Performed at [facility]    Report Status 11/18/2014 FINAL  Final  Culture, blood (routine x 2) Call MD if unable to obtain prior to antibiotics being given     Status: None   Collection Time: 11/21/14 10:41 AM  Result Value Ref Range Status   Specimen Description BLOOD ARM LEFT  Final   Special Requests   Final    BOTTLES DRAWN AEROBIC AND ANAEROBIC 10CC BOTH BOTTLES   Culture  Setup Time   Final    GRAM POSITIVE COCCI IN CLUSTERS Gram Stain Report Called to,Read Back By and Verified With: M. Long RN 17:50 11/24/14 ([email or web address]) ANAEROBIC BOTTLE ONLY    Culture   Final    METHICILLIN RESISTANT STAPHYLOCOCCUS AUREUS Performed at [facility]    Report Status 11/27/2014 FINAL  Final   Organism ID, Bacteria METHICILLIN RESISTANT STAPHYLOCOCCUS AUREUS  Final      Susceptibility   Methicillin resistant staphylococcus  aureus - MIC*    CIPROFLOXACIN >=8 RESISTANT Resistant     ERYTHROMYCIN >=8 RESISTANT Resistant     GENTAMICIN <=0.5 SENSITIVE Sensitive     OXACILLIN >=4 RESISTANT Resistant     TETRACYCLINE <=1 SENSITIVE Sensitive     VANCOMYCIN 1 SENSITIVE Sensitive     TRIMETH/SULFA <=10 SENSITIVE Sensitive     CLINDAMYCIN <=0.25 SENSITIVE Sensitive     RIFAMPIN <=0.5 SENSITIVE Sensitive     Inducible Clindamycin NEGATIVE Sensitive     * METHICILLIN RESISTANT STAPHYLOCOCCUS AUREUS  Culture, blood (routine x 2) Call MD if unable to obtain prior to antibiotics being given     Status: None   Collection Time: 11/21/14 10:49 AM  Result Value Ref Range Status   Specimen Description BLOOD RIGHT ARM  Final   Special Requests   Final    BOTTLES DRAWN AEROBIC AND ANAEROBIC 10CC BOTH BOTTLES   Culture  Setup Time   Final    GRAM POSITIVE COCCI IN CLUSTERS ANAEROBIC BOTTLE ONLY CRITICAL RESULT CALLED TO, READ BACK BY AND VERIFIED WITH: A DEVINE,MD AT 2536 11/24/14 BY L BENFIELD    Culture   Final    METHICILLIN RESISTANT STAPHYLOCOCCUS AUREUS SUSCEPTIBILITIES PERFORMED ON PREVIOUS CULTURE WITHIN THE LAST 5 DAYS. Performed at [facility]    Report Status 11/27/2014 FINAL  Final  Blood culture (routine x 2)     Status: None   Collection Time: 11/30/14  8:56 PM  Result Value Ref Range Status   Specimen  Description BLOOD RIGHT HAND  Final   Special Requests BOTTLES DRAWN AEROBIC AND ANAEROBIC 10CC EA  Final   Culture   Final    NO GROWTH 5 DAYS [facility]    Report Status 12/05/2014 FINAL  Final  Blood culture (routine x 2)     Status: None   Collection Time: 11/30/14  9:19 PM  Result Value Ref Range Status   Specimen Description BLOOD LEFT ANTECUBITAL  Final   Special Requests BOTTLES DRAWN AEROBIC AND ANAEROBIC 5CC EA  Final   Culture   Final    NO GROWTH 5 DAYS [facility]    Report Status 12/05/2014 FINAL  Final  Urine culture     Status: None    Collection Time: 11/30/14  9:57 PM  Result Value Ref Range Status   Specimen Description URINE, CATHETERIZED  Final   Special Requests NONE  Final   Culture   Final    NO GROWTH 2 DAYS [facility]    Report Status 12/02/2014 FINAL  Final  Culture, bal-quantitative     Status: None   Collection Time: 12/03/14  9:29 AM  Result Value Ref Range Status   Specimen Description BRONCHIAL ALVEOLAR LAVAGE  Final   Special Requests NONE  Final   Gram Stain   Final    RARE WBC PRESENT, PREDOMINANTLY MONONUCLEAR RARE SQUAMOUS EPITHELIAL CELLS PRESENT FEW GRAM POSITIVE COCCI IN PAIRS IN CLUSTERS RARE GRAM NEGATIVE RODS Performed at [facility]    Colony Count   Final    75,000 COLONIES/ML Performed at [facility]    Culture   Final    Non-Pathogenic Oropharyngeal-type Flora Isolated. Performed at [facility]    Report Status 12/06/2014 FINAL  Final  AFB culture with smear     Status: None (Preliminary result)   Collection Time: 12/03/14  9:29 AM  Result Value Ref Range Status   Specimen Description BRONCHIAL ALVEOLAR LAVAGE  Final   Special Requests NONE  Final   Acid Fast Smear   Final    NO ACID FAST BACILLI SEEN Performed at [facility]    Culture   Final    CULTURE WILL BE EXAMINED FOR 6 WEEKS BEFORE ISSUING A FINAL REPORT Performed at [facility]    Report Status PENDING  Incomplete  Fungus Culture with Smear     Status: None (Preliminary result)   Collection Time: 12/03/14  9:29 AM  Result Value Ref Range Status   Specimen Description BRONCHIAL ALVEOLAR LAVAGE  Final   Special Requests NONE  Final   Fungal Smear   Final    NO YEAST OR FUNGAL ELEMENTS SEEN Performed at [facility]    Culture   Final    CANDIDA ALBICANS Performed at [facility]    Report Status PENDING  Incomplete    Medical History: Past Medical History  Diagnosis Date  . Diabetes mellitus without complication    . Hypertension   . Endocarditis    Assessment: [age] yoM with recent admission for sepsis secondary to MRSA bacteremia with septic emboli discharged with plan for IV vancomycin via home health however patient states he has not received any doses since discharge.  Pt returns to ED 7/19 for IV antibiotics. Pharmacy consulted to restart vancomycin for endocarditis.  SCr 0.90, CrCl ~99 (N97)  7/19 >> Vancomycin >>  7/19 bloodx2: ordered  Dose adjustments/levels: 7/12 2100 VT: 20 on  1g q8h  Goal of Therapy:  Vancomycin trough level 15-20 mcg/ml  Plan:  Vancomycin 1500mg  IV now, then 1g q8h (same dose as last admission which produced therapeutic trough) F/u TEE results? F/u renal fxn, clinical course  Haynes Hoehn, PharmD, BCPS 12/08/2014, 6:15 PM  Pager: [phone]

## 2014-12-09 ENCOUNTER — Encounter: Payer: Self-pay | Admitting: Internal Medicine

## 2014-12-09 ENCOUNTER — Encounter (HOSPITAL_COMMUNITY): Payer: Self-pay | Admitting: Nurse Practitioner

## 2014-12-09 DIAGNOSIS — Z794 Long term (current) use of insulin: Secondary | ICD-10-CM | POA: Diagnosis not present

## 2014-12-09 DIAGNOSIS — R000 Tachycardia, unspecified: Secondary | ICD-10-CM | POA: Diagnosis present

## 2014-12-09 DIAGNOSIS — I10 Essential (primary) hypertension: Secondary | ICD-10-CM | POA: Diagnosis present

## 2014-12-09 DIAGNOSIS — E1310 Other specified diabetes mellitus with ketoacidosis without coma: Secondary | ICD-10-CM | POA: Diagnosis present

## 2014-12-09 DIAGNOSIS — I330 Acute and subacute infective endocarditis: Secondary | ICD-10-CM | POA: Diagnosis not present

## 2014-12-09 DIAGNOSIS — I76 Septic arterial embolism: Secondary | ICD-10-CM | POA: Diagnosis present

## 2014-12-09 DIAGNOSIS — Z833 Family history of diabetes mellitus: Secondary | ICD-10-CM | POA: Diagnosis not present

## 2014-12-09 DIAGNOSIS — Z79899 Other long term (current) drug therapy: Secondary | ICD-10-CM | POA: Diagnosis not present

## 2014-12-09 DIAGNOSIS — Z8249 Family history of ischemic heart disease and other diseases of the circulatory system: Secondary | ICD-10-CM | POA: Diagnosis not present

## 2014-12-09 DIAGNOSIS — R509 Fever, unspecified: Secondary | ICD-10-CM | POA: Diagnosis not present

## 2014-12-09 DIAGNOSIS — Z9119 Patient's noncompliance with other medical treatment and regimen: Secondary | ICD-10-CM | POA: Diagnosis present

## 2014-12-09 DIAGNOSIS — B9562 Methicillin resistant Staphylococcus aureus infection as the cause of diseases classified elsewhere: Secondary | ICD-10-CM | POA: Diagnosis present

## 2014-12-09 LAB — GLUCOSE, CAPILLARY
GLUCOSE-CAPILLARY: 119 mg/dL — AB (ref 65–99)
Glucose-Capillary: 169 mg/dL — ABNORMAL HIGH (ref 65–99)
Glucose-Capillary: 166 mg/dL — ABNORMAL HIGH (ref 65–99)
Glucose-Capillary: 95 mg/dL (ref 65–99)

## 2014-12-09 LAB — CBC
HCT: 26.2 % — ABNORMAL LOW (ref 39.0–52.0)
HEMOGLOBIN: 8.3 g/dL — AB (ref 13.0–17.0)
MCH: 26 pg (ref 26.0–34.0)
MCHC: 31.7 g/dL (ref 30.0–36.0)
MCV: 82.1 fL (ref 78.0–100.0)
Platelets: 459 10*3/uL — ABNORMAL HIGH (ref 150–400)
RBC: 3.19 MIL/uL — ABNORMAL LOW (ref 4.22–5.81)
RDW: 14.8 % (ref 11.5–15.5)
WBC: 9.9 10*3/uL (ref 4.0–10.5)

## 2014-12-09 LAB — BASIC METABOLIC PANEL
ANION GAP: 8 (ref 5–15)
BUN: 8 mg/dL (ref 6–20)
CO2: 30 mmol/L (ref 22–32)
Calcium: 8.5 mg/dL — ABNORMAL LOW (ref 8.9–10.3)
Chloride: 100 mmol/L — ABNORMAL LOW (ref 101–111)
Creatinine, Ser: 0.86 mg/dL (ref 0.61–1.24)
GFR calc Af Amer: >60 mL/min (ref >60)
GLUCOSE: 125 mg/dL — AB (ref 65–99)
Potassium: 3.4 mmol/L — ABNORMAL LOW (ref 3.5–5.1)
Sodium: 138 mmol/L (ref 135–145)

## 2014-12-09 MED ORDER — VANCOMYCIN HCL 1000 MG IV SOLR
1500.0000 mg | Freq: Two times a day (BID) | INTRAVENOUS | Status: AC
  Administered 2014-12-09 (×2): 1500 mg via INTRAVENOUS
  Filled 2014-12-09 (×2): qty 2000

## 2014-12-09 MED ORDER — VANCOMYCIN HCL 10 G IV SOLR
1500.0000 mg | Freq: Two times a day (BID) | INTRAVENOUS | Status: DC
  Administered 2014-12-10: 1500 mg via INTRAVENOUS
  Filled 2014-12-09: qty 1500

## 2014-12-09 NOTE — Clinical Social Work Note (Signed)
Clinical Social Work Assessment  Patient Details  Name: Marc [name] MRN: [id number] Date of Birth: 29-Jan-1961  Date of referral:  12/09/14               Reason for consult:  Discharge Planning                Permission sought to share information with:    Permission granted to share information::  No  Name::        Agency::     Relationship::     Contact Information:     Housing/Transportation Living arrangements for the past 2 months:  Homeless (but has been staying with pt mother) Source of Information:  Patient Patient Interpreter Needed:  None Criminal Activity/Legal Involvement Pertinent to Current Situation/Hospitalization:  No - Comment as needed Significant Relationships:  Other Family Members Lives with:  Self (currently residing at mother's home per pt) Do you feel safe going back to the place where you live?  Yes Need for family participation in patient care:  No (Coment)  Care giving concerns:  Pt from home with IV antibiotics. Pt readmitted secondary to noncompliance with IV antibiotics. RNCM has spoken with pt niece who is agreeable to learning to administer IV antibiotics, but pt niece has not yet come to hospital. RNCM asked CSW to speak with pt about SNF.    Social Worker assessment / plan:  CSW met with pt at bedside. CSW introduced self and explained role. Pt states that he is confident that pt niece will be able to assist him with the IV antibiotics. CSW discussed with pt option of SNF in order to received IV antibiotics. Pt declined SNF placement and states that he is otherwise healthy and does not feel he needs SNF for the IV antibiotics. Pt adamant about returning home and will not consider SNF for assistance.  CSW notified RNCM of pt declining SNF.   No further social work needs identified at this time.  CSW signing off.   Please re-consult if further social work needs arise.   Employment status:  Unemployed Forensic scientist:  Self Pay  (Medicaid Pending) PT Recommendations:  Not assessed at this time Information / Referral to community resources:  Other (Comment Required) (RNCM following)  Patient/Family's Response to care:  Pt alert and oriented x 4. Pt adamant about not wanting to go to SNF for IV antibiotics.   Patient/Family's Understanding of and Emotional Response to Diagnosis, Current Treatment, and Prognosis:  Pt displays lack of understanding about treatment plan as pt readmitted for noncompliance of IV antibiotics.  Emotional Assessment Appearance:  Appears stated age Attitude/Demeanor/Rapport:  Guarded Affect (typically observed):  Guarded Orientation:  Oriented to Self, Oriented to Place, Oriented to  Time, Oriented to Situation Alcohol / Substance use:  Not Applicable Psych involvement (Current and /or in the community):  No (Comment)  Discharge Needs  Concerns to be addressed:  Discharge Planning Concerns Readmission within the last 30 days:  Yes Current discharge risk:  Lack of support system Barriers to Discharge:  Other (awaiting pt niece to come to hospital for teaching of IV abx. Assessing pt for SNF for other option)   Brylinn Teaney, [place] A, LCSW 12/09/2014, 2:57 PM  [phone]

## 2014-12-09 NOTE — Progress Notes (Signed)
[facility] PROGRESS NOTE  Marc [name] ZOX:[id number] DOB: [birth year] DOA: 12/08/2014 PCP: No PCP Per Patient  Summary I have seen and examined Marc [name] at bedside and reviewed his chart. 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 Arrangements are being made to ensure patient takes  antibiotics at home. His niece is coming in tomorrow to learn how to administer the antibiotics. He reports feeling better. We will continue current management and discharge home once arrangements for antibiotic administration finalized, likely tomorrow. Appreciate chest management. Plan Endocarditis  Continue current antibiotic to complete course infectious diseases recommendations  Essential hypertension/DM type 2, not at goal/Non-compliance with treatment  No acute changes Code Status: Full Code. Family Communication: None at bedside, will discuss with niece tomorrow Disposition Plan: Home tomorrow   Consultants:  None  Procedures:  None  Antibiotics:  Vancomycin per pharmacy. Appreciate help.  HPI/Subjective: Has no complaints  Objective: Filed Vitals:   12/09/14 1454  BP: 132/68  Pulse: 98  Temp: 98.7 F (37.1 C)  Resp: 18    Intake/Output Summary (Last 24 hours) at 12/09/14 1859 Last data filed at 12/09/14 1846  Gross per 24 hour  Intake    730 ml  Output   4920 ml  Net  -4190 ml   Filed Weights   12/08/14 1900 12/08/14 2222  Weight: 83.915 kg (185 lb) 77.429 kg (170 lb 11.2 oz)    Exam:   General:  Comfortable at rest.  Cardiovascular: S1-S2 normal. No murmurs. Pulse regular.  Respiratory: Good air entry bilaterally. No rhonchi or rales.  Abdomen: Soft and nontender. Normal bowel sounds. No organomegaly.  Musculoskeletal: No pedal edema   Neurological: Intact  Data Reviewed: Basic Metabolic Panel:  Recent Labs Lab 12/03/14 0611  12/04/14 0400 12/08/14 1820 12/09/14 0500  NA 137 136 137 138  K 3.3* 3.4* 3.8 3.4*  CL 102 102 101 100*  CO2 GLUCOSE 73 93 235* 125*  BUN CREATININE 0.74 0.90 0.91 0.86  CALCIUM 8.5* 8.2* 8.5* 8.5*   Liver Function Tests:  Recent Labs Lab 12/08/14 1820  AST 16  ALT 37  ALKPHOS 161*  BILITOT 0.1*  PROT 7.2  ALBUMIN 2.5*   No results for input(s): LIPASE, AMYLASE in the last 168 hours. No results for input(s): AMMONIA in the last 168 hours. CBC:  Recent Labs Lab 12/03/14 0611 12/04/14 0400 12/08/14 1820 12/09/14 0500  WBC 9.6 9.7 10.9* 9.9  NEUTROABS  --   --  7.6  --   HGB 9.0* 8.4* 8.0* 8.3*  HCT 28.3* 26.4* 25.5* 26.2*  MCV 81.1 82.2 82.3 82.1  PLT 372 339 415* 459*   Cardiac Enzymes: No results for input(s): CKTOTAL, CKMB, CKMBINDEX, TROPONINI in the last 168 hours. BNP (last 3 results) No results for input(s): BNP in the last 8760 hours.  ProBNP (last 3 results) No results for input(s): PROBNP in the last 8760 hours.  CBG:  Recent Labs Lab 12/04/14 2123 12/08/14 2112 12/09/14 0827 12/09/14 1213 12/09/14 1658  GLUCAP 112* 249* 169* 166* 119*    Recent Results (from the past 240 hour(s))  Blood culture (routine x 2)     Status: None   Collection Time: 11/30/14  8:56 PM  Result Value Ref Range Status   Specimen Description BLOOD RIGHT HAND  Final   Special Requests BOTTLES DRAWN AEROBIC  AND ANAEROBIC 10CC EA  Final   Culture   Final    NO GROWTH 5 DAYS Performed at [facility]    Report Status 12/05/2014 FINAL  Final  Blood culture (routine x 2)     Status: None   Collection Time: 11/30/14  9:19 PM  Result Value Ref Range Status   Specimen Description BLOOD LEFT ANTECUBITAL  Final   Special Requests BOTTLES DRAWN AEROBIC AND ANAEROBIC 5CC EA  Final   Culture   Final    NO GROWTH 5 DAYS Performed at [facility]    Report Status 12/05/2014 FINAL  Final  Urine culture     Status: None    Collection Time: 11/30/14  9:57 PM  Result Value Ref Range Status   Specimen Description URINE, CATHETERIZED  Final   Special Requests NONE  Final   Culture   Final    NO GROWTH 2 DAYS Performed at [facility]    Report Status 12/02/2014 FINAL  Final  Culture, bal-quantitative     Status: None   Collection Time: 12/03/14  9:29 AM  Result Value Ref Range Status   Specimen Description BRONCHIAL ALVEOLAR LAVAGE  Final   Special Requests NONE  Final   Gram Stain   Final    RARE WBC PRESENT, PREDOMINANTLY MONONUCLEAR RARE SQUAMOUS EPITHELIAL CELLS PRESENT FEW GRAM POSITIVE COCCI IN PAIRS IN CLUSTERS RARE GRAM NEGATIVE RODS Performed at [facility]   Final    75,000 COLONIES/ML Performed at [facility]    Culture   Final    Non-Pathogenic Oropharyngeal-type Flora Isolated. Performed at [facility]    Report Status 12/06/2014 FINAL  Final  AFB culture with smear     Status: None (Preliminary result)   Collection Time: 12/03/14  9:29 AM  Result Value Ref Range Status   Specimen Description BRONCHIAL ALVEOLAR LAVAGE  Final   Special Requests NONE  Final   Acid Fast Smear   Final    NO ACID FAST BACILLI SEEN Performed at [facility]    Culture   Final    CULTURE WILL BE EXAMINED FOR 6 WEEKS BEFORE ISSUING A FINAL REPORT Performed at [facility]    Report Status PENDING  Incomplete  Fungus Culture with Smear     Status: None (Preliminary result)   Collection Time: 12/03/14  9:29 AM  Result Value Ref Range Status   Specimen Description BRONCHIAL ALVEOLAR LAVAGE  Final   Special Requests NONE  Final   Fungal Smear   Final    NO YEAST OR FUNGAL ELEMENTS SEEN Performed at [facility]    Culture   Final    CANDIDA ALBICANS Performed at [facility]    Report Status PENDING  Incomplete  Blood culture (routine x 2)     Status: None (Preliminary result)   Collection Time: 12/08/14  6:09 PM   Result Value Ref Range Status   Specimen Description BLOOD LEFT HAND  Final   Special Requests BOTTLES DRAWN AEROBIC AND ANAEROBIC 5CC EACH  Final   Culture   Final    NO GROWTH < 24 HOURS Performed at [facility]    Report Status PENDING  Incomplete  Blood culture (routine x 2)     Status: None (Preliminary result)   Collection Time: 12/08/14  6:30 PM  Result Value Ref Range Status   Specimen Description BLOOD L HAND  Final  Special Requests BOTTLES DRAWN AEROBIC AND ANAEROBIC 5CC  Final   Culture   Final    NO GROWTH < 24 HOURS [facility]    Report Status PENDING  Incomplete     Studies: No results found.  Scheduled Meds: . amLODipine  10 mg Oral Daily  . folic acid  1 mg Oral Daily  . glimepiride  2 mg Oral Q breakfast  . heparin  5,000 Units Subcutaneous 3 times per day  . insulin aspart  0-15 Units Subcutaneous TID WC  . insulin aspart  0-5 Units Subcutaneous QHS  . insulin glargine  35 Units Subcutaneous QHS  . lisinopril  5 mg Oral Daily  . pantoprazole  40 mg Oral Daily  . tamsulosin  0.4 mg Oral QPC supper  . vancomycin (VANCOCIN) 1000 mg IVPB  1,500 mg Intravenous Q12H   Continuous Infusions:    Time spent: 25 minutes    Marc [name]  [facility] Pager [phone]. If 7PM-7AM, please contact night-coverage at [email or web address], password [email or web address] 12/09/2014, 6:59 PM  LOS: 0 days

## 2014-12-09 NOTE — Care Management Note (Signed)
Case Management Note  Patient Details  Name: Marc [name] MRN: [id number] Date of Birth: 01-Sep-1960  Subjective/Objective: 54 y/o m admitted w/Endocarditis. Recent hospital stay-d/c home.                   Action/Plan:Spoke to patient about d/c plans. Confirmed w/patient:d/c address:[address] [place] [address]-Marc [name](niece) willing to learn. She will be here to hospital @ 1p.[facility] rep Marc [name] aware of referral, She will contact [facility] IV liason Marc [name] who will be here from 1-2p to confirm teaching.  IV abx Vancomycin 1500mg  q12 x 6weeks.R arm PICC.Once teaching is agreed then will update MD w/outcome.MD/CSW updated.   Expected Discharge Date:   (unknown)               Expected Discharge Plan:  Home w Home Health Services  In-House Referral:     Discharge planning Services  CM Consult  Post Acute Care Choice:    Choice offered to:  Patient  DME Arranged:    DME Agency:     HH Arranged:    HH Agency:  [facility]  Status of Service:  In process, will continue to follow  Medicare Important Message Given:    Date Medicare IM Given:    Medicare IM give by:    Date Additional Medicare IM Given:    Additional Medicare Important Message give by:     If discussed at Long Length of Stay Meetings, dates discussed:    Additional Comments:  Lanier ClamMahabir, Caidyn Blossom, RN 12/09/2014, 12:02 PM

## 2014-12-09 NOTE — Progress Notes (Signed)
ANTIBIOTIC CONSULT NOTE - Follow Up  Pharmacy Consult for Vancomycin Indication: Endocarditis  No Known Allergies  Patient Measurements: Weight: 170 lb 11.2 oz (77.429 kg) Adjusted Body Weight:   Vital Signs: Temp: 98.4 F (36.9 C) (07/20 0725) Temp Source: Oral (07/20 0725) BP: 116/84 mmHg (07/20 0725) Pulse Rate: 88 (07/20 0725) Intake/Output from previous day: 07/19 0701 - 07/20 0700 In: -  Out: 2700 [Urine:2700] Intake/Output from this shift: Total I/O In: 240 [P.O.:240] Out: 800 [Urine:800]  Labs:  Recent Labs  12/08/14 1820 12/09/14 0500  WBC 10.9* 9.9  HGB 8.0* 8.3*  PLT 415* 459*  CREATININE 0.91 0.86   Estimated Creatinine Clearance: 103.7 mL/min (by C-G formula based on Cr of 0.86). No results for input(s): VANCOTROUGH, VANCOPEAK, VANCORANDOM, GENTTROUGH, GENTPEAK, GENTRANDOM, TOBRATROUGH, TOBRAPEAK, TOBRARND, AMIKACINPEAK, AMIKACINTROU, AMIKACIN in the last 72 hours.   Microbiology: Recent Results (from the past 720 hour(s))  Urine culture     Status: None   Collection Time: 11/16/14 12:11 PM  Result Value Ref Range Status   Specimen Description URINE, CLEAN CATCH  Final   Special Requests NONE  Final   Culture   Final    MULTIPLE SPECIES PRESENT, SUGGEST RECOLLECTION IF CLINICALLY INDICATED Performed at [facility]    Report Status 11/18/2014 FINAL  Final  Culture, blood (routine x 2) Call MD if unable to obtain prior to antibiotics being given     Status: None   Collection Time: 11/21/14 10:41 AM  Result Value Ref Range Status   Specimen Description BLOOD ARM LEFT  Final   Special Requests   Final    BOTTLES DRAWN AEROBIC AND ANAEROBIC 10CC BOTH BOTTLES   Culture  Setup Time   Final    GRAM POSITIVE COCCI IN CLUSTERS Gram Stain Report Called to,Read Back By and Verified With: M. Long RN 17:50 11/24/14 ([email or web address]) ANAEROBIC BOTTLE ONLY    Culture   Final    METHICILLIN RESISTANT STAPHYLOCOCCUS AUREUS Performed at [facility]     Report Status 11/27/2014 FINAL  Final   Organism ID, Bacteria METHICILLIN RESISTANT STAPHYLOCOCCUS AUREUS  Final      Susceptibility   Methicillin resistant staphylococcus aureus - MIC*    CIPROFLOXACIN >=8 RESISTANT Resistant     ERYTHROMYCIN >=8 RESISTANT Resistant     GENTAMICIN <=0.5 SENSITIVE Sensitive     OXACILLIN >=4 RESISTANT Resistant     TETRACYCLINE <=1 SENSITIVE Sensitive     VANCOMYCIN 1 SENSITIVE Sensitive     TRIMETH/SULFA <=10 SENSITIVE Sensitive     CLINDAMYCIN <=0.25 SENSITIVE Sensitive     RIFAMPIN <=0.5 SENSITIVE Sensitive     Inducible Clindamycin NEGATIVE Sensitive     * METHICILLIN RESISTANT STAPHYLOCOCCUS AUREUS  Culture, blood (routine x 2) Call MD if unable to obtain prior to antibiotics being given     Status: None   Collection Time: 11/21/14 10:49 AM  Result Value Ref Range Status   Specimen Description BLOOD RIGHT ARM  Final   Special Requests   Final    BOTTLES DRAWN AEROBIC AND ANAEROBIC 10CC BOTH BOTTLES   Culture  Setup Time   Final    GRAM POSITIVE COCCI IN CLUSTERS ANAEROBIC BOTTLE ONLY CRITICAL RESULT CALLED TO, READ BACK BY AND VERIFIED WITH: A DEVINE,MD AT 1610 11/24/14 BY L BENFIELD    Culture   Final    METHICILLIN RESISTANT STAPHYLOCOCCUS AUREUS SUSCEPTIBILITIES PERFORMED ON PREVIOUS CULTURE WITHIN THE LAST 5 DAYS. Performed at [facility]    Report Status  11/27/2014 FINAL  Final  Blood culture (routine x 2)     Status: None   Collection Time: 11/30/14  8:56 PM  Result Value Ref Range Status   Specimen Description BLOOD RIGHT HAND  Final   Special Requests BOTTLES DRAWN AEROBIC AND ANAEROBIC 10CC EA  Final   Culture   Final    NO GROWTH 5 DAYS Performed at [facility]    Report Status 12/05/2014 FINAL  Final  Blood culture (routine x 2)     Status: None   Collection Time: 11/30/14  9:19 PM  Result Value Ref Range Status   Specimen Description BLOOD LEFT ANTECUBITAL  Final   Special Requests BOTTLES DRAWN AEROBIC  AND ANAEROBIC 5CC EA  Final   Culture   Final    NO GROWTH 5 DAYS Performed at [facility]    Report Status 12/05/2014 FINAL  Final  Urine culture     Status: None   Collection Time: 11/30/14  9:57 PM  Result Value Ref Range Status   Specimen Description URINE, CATHETERIZED  Final   Special Requests NONE  Final   Culture   Final    NO GROWTH 2 DAYS Performed at [facility]    Report Status 12/02/2014 FINAL  Final  Culture, bal-quantitative     Status: None   Collection Time: 12/03/14  9:29 AM  Result Value Ref Range Status   Specimen Description BRONCHIAL ALVEOLAR LAVAGE  Final   Special Requests NONE  Final   Gram Stain   Final    RARE WBC PRESENT, PREDOMINANTLY MONONUCLEAR RARE SQUAMOUS EPITHELIAL CELLS PRESENT FEW GRAM POSITIVE COCCI IN PAIRS IN CLUSTERS RARE GRAM NEGATIVE RODS Performed at [facility]   Final    75,000 COLONIES/ML Performed at [facility]    Culture   Final    Non-Pathogenic Oropharyngeal-type Flora Isolated. Performed at [facility]    Report Status 12/06/2014 FINAL  Final  AFB culture with smear     Status: None (Preliminary result)   Collection Time: 12/03/14  9:29 AM  Result Value Ref Range Status   Specimen Description BRONCHIAL ALVEOLAR LAVAGE  Final   Special Requests NONE  Final   Acid Fast Smear   Final    NO ACID FAST BACILLI SEEN Performed at [facility]    Culture   Final    CULTURE WILL BE EXAMINED FOR 6 WEEKS BEFORE ISSUING A FINAL REPORT Performed at [facility]    Report Status PENDING  Incomplete  Fungus Culture with Smear     Status: None (Preliminary result)   Collection Time: 12/03/14  9:29 AM  Result Value Ref Range Status   Specimen Description BRONCHIAL ALVEOLAR LAVAGE  Final   Special Requests NONE  Final   Fungal Smear   Final    NO YEAST OR FUNGAL ELEMENTS SEEN Performed at [facility]    Culture   Final    CANDIDA  ALBICANS Performed at [facility]    Report Status PENDING  Incomplete    Medical History: Past Medical History  Diagnosis Date  . Diabetes mellitus without complication   . Hypertension   . Endocarditis    Assessment: 38 yoM with recent admission for sepsis secondary to MRSA bacteremia with septic emboli discharged with plan for IV vancomycin 1500 mg q12h via home health however patient states he has not received any doses since discharge.  Pt returns  to ED 7/19 for IV antibiotics. Pharmacy consulted to restart vancomycin.  ID saw patient during recent admission and recommended 6 weeks of IV vancomycin for presumed endocarditis.  7/19 (resumed) >> Vancomycin >>  7/19 bloodx2: ordered  Dose adjustments/levels: 7/12 Vancomycin trough = 20 on 1g q8h 7/14 Vancomycin trough = 19 on 1g q8h   Upon admission, provided patient with a 1500 mg loading dose and resumed 1g IV q8h dosing since patient obtained therapeutic trough levels on this dose during recent admission.  Would like to adjust to q12h dosing today for ease of administration after discharge.  Goal of Therapy:  Vancomycin trough level 15-20 mcg/ml  Plan:  1.  Adjust vancomycin to 1500 mg IV q12h. 2.  F/u trough level at steady state to ensure we can get therapeutic trough levels on q12h dosing.  Clance BollAmanda Janeese Mcgloin, PharmD, BCPS Pager: [phone] 12/09/2014 10:20 AM

## 2014-12-09 NOTE — Progress Notes (Addendum)
CSW received consult for questionable SNF placement for IV antibiotics as pt readmitted to hospital.   CSW discussed with covering RNCM, Juliann Pulse regarding pt situation.   RNCM met with pt at bedside to discuss disposition planning and RNCM stated that pt plan is for return home with [place] with family assistance. Please see RNCM note for further details.   No further social work needs identified.  CSW signing off.   Addendum: See psychosocial assessment on 12/09/14 at 2:57 pm. CSW was asked to see pt to offer SNF for IV antibiotics, pt declined. Pt RN, Di Kindle present for conversation.  RNCM notified.  CSW signing off.   Alison Murray, MSW, [place] [facility] [phone]

## 2014-12-09 NOTE — Progress Notes (Addendum)
1129 CM spoke with today's unit cm  1124 Spoke with one unit Cm and left message another unit CM  1116 CM Spoke With Medical Director Updated him on call to [facility] CoffinPam C, [facility] with consult 1041 ED Cm left message for 3W unit SW [phone] Cm reviewed case with Sheppard PentonKaren Thomas of [facility] and pt is not [facility] candidate for iv therapy (no charity) [phone]  ED CM spoke with Lenor CoffinPam C, [facility] about pt concerns with home therapy [facility] entered in SW consult in EPIC  Cm noted Cm consult for home health

## 2014-12-09 NOTE — Hospital Discharge Follow-Up (Signed)
Call placed to Lanier ClamKathy Mahabir, RN CM regarding patient. She indicates patient to discharge to niece's [facility](Joella Lavin [phone])home. Niece to come to hospital today and learn IV antibiotic administration.  Call placed to patient to follow-up. Patient confirms he will be staying with his niece, Coralie CommonJoella, after discharge. He indicates his niece will learn antibiotic administration and will administer medication upon discharge.  Discussed need for follow-up appointment with [facility]. Patient verbalized understanding and appointment obtained on 12/14/14 at 1415. Appointment placed on AVS. Patient indicates his significant other, Vernona RiegerLaura, will provide transportation to appointment. Determined if patient obtained medications after appointment with Dr. Venetia NightAmao on 12/08/14. He indicated he had all medications except "blood pressure medication;" patient uncertain if it was amlodipine. Patient also indicates he still needs to pick up glucometer. Patient informed [facility]. Informed he will need to pick up when discharged. Patient verbalized understanding.  Lanier ClamKathy Mahabir, RN CM updated on [facility] follow-up appointment.

## 2014-12-09 NOTE — Care Management Note (Signed)
Case Management Note  Patient Details  Name: Earnest Rosierrvin L Blubaugh MRN: [id number] Date of Birth: 29-Nov-1960  Subjective/Objective:   Patient's niece Coralie CommonJoella has not come to hospital as of yet for teaching of iv abx.[facility] has called twice-she keeps saying she is coming.She works from 3p-11p.We will continue to reach out to niece for tomorrow to teach, & hopeful for d/c tomorrow. Patient declines SNF.MD updated.                 Action/Plan:d/c plan is for home w/HHC.   Expected Discharge Date:   (unknown)               Expected Discharge Plan:  Home w Home Health Services  In-House Referral:     Discharge planning Services  CM Consult  Post Acute Care Choice:    Choice offered to:  Patient  DME Arranged:    DME Agency:     HH Arranged:    HH Agency:  [facility]  Status of Service:  In process, will continue to follow  Medicare Important Message Given:    Date Medicare IM Given:    Medicare IM give by:    Date Additional Medicare IM Given:    Additional Medicare Important Message give by:     If discussed at Long Length of Stay Meetings, dates discussed:    Additional Comments:  Lanier ClamMahabir, Chanie Soucek, RN 12/09/2014, 3:08 PM

## 2014-12-09 NOTE — Care Management Note (Signed)
Case Management Note  Patient Details  Name: Marc [name] MRN: [id number] Date of Birth: [birth year]  Subjective/Objective:   [name]'s(Niece) c#[phone].                 Action/Plan:   Expected Discharge Date:   (unknown)               Expected Discharge Plan:  Home w Home Health Services  In-House Referral:     Discharge planning Services  CM Consult  Post Acute Care Choice:    Choice offered to:  Patient  DME Arranged:    DME Agency:     HH Arranged:    HH Agency:  [facility]  Status of Service:  In process, will continue to follow  Medicare Important Message Given:    Date Medicare IM Given:    Medicare IM give by:    Date Additional Medicare IM Given:    Additional Medicare Important Message give by:     If discussed at Long Length of Stay Meetings, dates discussed:    Additional Comments:  Lanier ClamMahabir, Norita Meigs, RN 12/09/2014, 12:13 PM

## 2014-12-10 LAB — GLUCOSE, CAPILLARY
GLUCOSE-CAPILLARY: 118 mg/dL — AB (ref 65–99)
Glucose-Capillary: 64 mg/dL — ABNORMAL LOW (ref 65–99)
Glucose-Capillary: 93 mg/dL (ref 65–99)

## 2014-12-10 LAB — VANCOMYCIN, TROUGH: Vancomycin Tr: 20 ug/mL (ref 10.0–20.0)

## 2014-12-10 MED ORDER — ALTEPLASE 2 MG IJ SOLR
2.0000 mg | Freq: Once | INTRAMUSCULAR | Status: AC
  Administered 2014-12-10: 2 mg
  Filled 2014-12-10: qty 2

## 2014-12-10 MED ORDER — ALTEPLASE 2 MG IJ SOLR: 2.0000 mg | Freq: Once | INTRAMUSCULAR | Status: DC

## 2014-12-10 NOTE — Care Management Note (Signed)
Case Management Note  Patient Details  Name: Marc [name] MRN: [id number] Date of Birth: Jan 30, 1961  Subjective/Objective:  Per [facility] IV home care liason-Pam Chandler-IV abx teaching w/niece(Joella Reish) this morning scheduled @ 10a. [facility] will confirm if they have a teachable caregiver, if so then they can accept case.Await outcome of meeting.                 Action/Plan:   Expected Discharge Date:   (unknown)               Expected Discharge Plan:  Home w Home Health Services  In-House Referral:     Discharge planning Services  CM Consult  Post Acute Care Choice:    Choice offered to:  Patient  DME Arranged:    DME Agency:     HH Arranged:    HH Agency:  [facility]  Status of Service:  In process, will continue to follow  Medicare Important Message Given:    Date Medicare IM Given:    Medicare IM give by:    Date Additional Medicare IM Given:    Additional Medicare Important Message give by:     If discussed at Long Length of Stay Meetings, dates discussed:    Additional Comments:  Lanier Clam, RN 12/10/2014, 9:04 AM

## 2014-12-10 NOTE — Plan of Care (Signed)
Problem: Phase I Progression Outcomes Goal: Voiding-avoid urinary catheter unless indicated Outcome: Not Progressing Chronic foley     

## 2014-12-10 NOTE — Progress Notes (Signed)
Both Dr. Venetia Constable and myself spoke with the pt at separate times regarding his wishes to leave AMA. Pt states that "he is ready to leave, and understands the risks with him leaving including death. Pt still insists on leaving AMA, has signed necessary paperwork and will be leaving once PICC line is removed and pt lays flat for 30 mins

## 2014-12-10 NOTE — Progress Notes (Signed)
ANTIBIOTIC CONSULT NOTE - Follow Up  Pharmacy Consult for Vancomycin Indication: Endocarditis  No Known Allergies  Patient Measurements: Height:  (180.3 cm) Weight: 170 lb 11.2 oz (77.429 kg) IBW/kg (Calculated) : 75.3 Adjusted Body Weight:   Vital Signs: Temp: 98.7 F (37.1 C) (07/21 0501) Temp Source: Oral (07/21 0501) BP: 114/53 mmHg (07/21 0501) Pulse Rate: 98 (07/21 0501) Intake/Output from previous day: 07/20 0701 - 07/21 0700 In: 980 [P.O.:480; IV Piggyback:500] Out: 2620 [Urine:2620] Intake/Output from this shift: Total I/O In: -  Out: 1500 [Urine:1500]  Labs:  Recent Labs  12/08/14 1820 12/09/14 0500  WBC 10.9* 9.9  HGB 8.0* 8.3*  PLT 415* 459*  CREATININE 0.91 0.86   Estimated Creatinine Clearance: 107 mL/min (by C-G formula based on Cr of 0.86).  Recent Labs  12/10/14 1145  VANCOTROUGH 20     Microbiology: Recent Results (from the past 720 hour(s))  Urine culture     Status: None   Collection Time: 11/16/14 12:11 PM  Result Value Ref Range Status   Specimen Description URINE, CLEAN CATCH  Final   Special Requests NONE  Final   Culture   Final    MULTIPLE SPECIES PRESENT, SUGGEST RECOLLECTION IF CLINICALLY INDICATED Performed at [facility]    Report Status 11/18/2014 FINAL  Final  Culture, blood (routine x 2) Call MD if unable to obtain prior to antibiotics being given     Status: None   Collection Time: 11/21/14 10:41 AM  Result Value Ref Range Status   Specimen Description BLOOD ARM LEFT  Final   Special Requests   Final    BOTTLES DRAWN AEROBIC AND ANAEROBIC 10CC BOTH BOTTLES   Culture  Setup Time   Final    GRAM POSITIVE COCCI IN CLUSTERS Gram Stain Report Called to,Read Back By and Verified With: M. Long RN 17:50 11/24/14 ([email or web address]) ANAEROBIC BOTTLE ONLY    Culture   Final    METHICILLIN RESISTANT STAPHYLOCOCCUS AUREUS Performed at [facility]    Report Status 11/27/2014 FINAL  Final   Organism ID, Bacteria  METHICILLIN RESISTANT STAPHYLOCOCCUS AUREUS  Final      Susceptibility   Methicillin resistant staphylococcus aureus - MIC*    CIPROFLOXACIN >=8 RESISTANT Resistant     ERYTHROMYCIN >=8 RESISTANT Resistant     GENTAMICIN <=0.5 SENSITIVE Sensitive     OXACILLIN >=4 RESISTANT Resistant     TETRACYCLINE <=1 SENSITIVE Sensitive     VANCOMYCIN 1 SENSITIVE Sensitive     TRIMETH/SULFA <=10 SENSITIVE Sensitive     CLINDAMYCIN <=0.25 SENSITIVE Sensitive     RIFAMPIN <=0.5 SENSITIVE Sensitive     Inducible Clindamycin NEGATIVE Sensitive     * METHICILLIN RESISTANT STAPHYLOCOCCUS AUREUS  Culture, blood (routine x 2) Call MD if unable to obtain prior to antibiotics being given     Status: None   Collection Time: 11/21/14 10:49 AM  Result Value Ref Range Status   Specimen Description BLOOD RIGHT ARM  Final   Special Requests   Final    BOTTLES DRAWN AEROBIC AND ANAEROBIC 10CC BOTH BOTTLES   Culture  Setup Time   Final    GRAM POSITIVE COCCI IN CLUSTERS ANAEROBIC BOTTLE ONLY CRITICAL RESULT CALLED TO, READ BACK BY AND VERIFIED WITH: A DEVINE,MD AT 9562 11/24/14 BY L BENFIELD    Culture   Final    METHICILLIN RESISTANT STAPHYLOCOCCUS AUREUS SUSCEPTIBILITIES PERFORMED ON PREVIOUS CULTURE WITHIN THE LAST 5 DAYS. Performed at [facility]    Report  Status 11/27/2014 FINAL  Final  Blood culture (routine x 2)     Status: None   Collection Time: 11/30/14  8:56 PM  Result Value Ref Range Status   Specimen Description BLOOD RIGHT HAND  Final   Special Requests BOTTLES DRAWN AEROBIC AND ANAEROBIC 10CC EA  Final   Culture   Final    NO GROWTH 5 DAYS Performed at [facility]    Report Status 12/05/2014 FINAL  Final  Blood culture (routine x 2)     Status: None   Collection Time: 11/30/14  9:19 PM  Result Value Ref Range Status   Specimen Description BLOOD LEFT ANTECUBITAL  Final   Special Requests BOTTLES DRAWN AEROBIC AND ANAEROBIC 5CC EA  Final   Culture   Final    NO GROWTH 5  DAYS Performed at [facility]    Report Status 12/05/2014 FINAL  Final  Urine culture     Status: None   Collection Time: 11/30/14  9:57 PM  Result Value Ref Range Status   Specimen Description URINE, CATHETERIZED  Final   Special Requests NONE  Final   Culture   Final    NO GROWTH 2 DAYS Performed at [facility]    Report Status 12/02/2014 FINAL  Final  Culture, bal-quantitative     Status: None   Collection Time: 12/03/14  9:29 AM  Result Value Ref Range Status   Specimen Description BRONCHIAL ALVEOLAR LAVAGE  Final   Special Requests NONE  Final   Gram Stain   Final    RARE WBC PRESENT, PREDOMINANTLY MONONUCLEAR RARE SQUAMOUS EPITHELIAL CELLS PRESENT FEW GRAM POSITIVE COCCI IN PAIRS IN CLUSTERS RARE GRAM NEGATIVE RODS Performed at [facility]   Final    75,000 COLONIES/ML Performed at [facility]    Culture   Final    Non-Pathogenic Oropharyngeal-type Flora Isolated. Performed at [facility]    Report Status 12/06/2014 FINAL  Final  AFB culture with smear     Status: None (Preliminary result)   Collection Time: 12/03/14  9:29 AM  Result Value Ref Range Status   Specimen Description BRONCHIAL ALVEOLAR LAVAGE  Final   Special Requests NONE  Final   Acid Fast Smear   Final    NO ACID FAST BACILLI SEEN Performed at [facility]    Culture   Final    CULTURE WILL BE EXAMINED FOR 6 WEEKS BEFORE ISSUING A FINAL REPORT Performed at [facility]    Report Status PENDING  Incomplete  Fungus Culture with Smear     Status: None (Preliminary result)   Collection Time: 12/03/14  9:29 AM  Result Value Ref Range Status   Specimen Description BRONCHIAL ALVEOLAR LAVAGE  Final   Special Requests NONE  Final   Fungal Smear   Final    NO YEAST OR FUNGAL ELEMENTS SEEN Performed at [facility]    Culture   Final    CANDIDA ALBICANS Performed at [facility]    Report Status PENDING   Incomplete  Blood culture (routine x 2)     Status: None (Preliminary result)   Collection Time: 12/08/14  6:09 PM  Result Value Ref Range Status   Specimen Description BLOOD LEFT HAND  Final   Special Requests BOTTLES DRAWN AEROBIC AND ANAEROBIC 5CC EACH  Final   Culture   Final    NO GROWTH < 24 HOURS Performed at [facility]    Report Status PENDING  Incomplete  Blood culture (routine x 2)     Status: None (Preliminary result)   Collection Time: 12/08/14  6:30 PM  Result Value Ref Range Status   Specimen Description BLOOD L HAND  Final   Special Requests BOTTLES DRAWN AEROBIC AND ANAEROBIC 5CC  Final   Culture   Final    NO GROWTH < 24 HOURS Performed at [facility]    Report Status PENDING  Incomplete    Medical History: Past Medical History  Diagnosis Date  . Diabetes mellitus without complication   . Hypertension   . Endocarditis    Assessment: 74 yoM with recent admission for sepsis secondary to MRSA bacteremia with septic emboli discharged with plan for IV vancomycin 1500 mg q12h via home health however patient states he has not received any doses since discharge.  Pt returns to ED 7/19 for IV antibiotics. Pharmacy consulted to restart vancomycin.  ID saw patient during recent admission and recommended 6 weeks of IV vancomycin for presumed endocarditis.  7/19 (resumed) >> Vancomycin >>  7/19 bloodx2: NGTD  Dose adjustments/levels: 7/12 Vancomycin trough = 20 on 1g q8h 7/14 Vancomycin trough = 19 on 1g q8h  7/21 Vancomycin trough = 20 on 1250mg  q12h (prior to 3rd dose)  Upon admission, provided patient with a 1500 mg loading dose and resumed 1g IV q8h dosing since patient obtained therapeutic trough levels on this dose during recent admission.  Dose adjusted to 1250mg  q12h dosing for ease of administration after discharge.    Goal of Therapy:  Vancomycin trough level 15-20 mcg/ml  Plan:  Continue vancomycin to 1500 mg IV q12h. F/u repeat trough  level at steady state  Haynes Hoehn, PharmD, BCPS 12/10/2014, 1:31 PM  Pager: [phone]

## 2014-12-10 NOTE — Discharge Summary (Addendum)
Patient signed out AGAINST MEDICAL ADVICE despite the care team's encouragement for him to stay and have arrangements made for him to take IV antibiotics at home. Patient was counting on his niece to help with administration of the IV antibiotic at home, but he says the niece seems like she changed her mind. He does not want to go to skilled nursing facility. He realizes he can die from infective endocarditis without proper treatment. Despite counseling him, he decided to leave.

## 2014-12-10 NOTE — Progress Notes (Signed)
This CM was notified by [facility] infusion RN that pt niece Joella Niess again did not show up for teaching of IV abx administration for pt. This CM called Joella and left a message for her to return my call and I have not received a return phone call. I spoke with pt at bedside and while I was in the room pt called his mother who was with Joella. Mother informed me that Coralie Common would not be coming today. This CM asked pt who else could administer his antibiotics as a "Plan B". Pt states that he could do it himself. When I spoke to infusion RN I was informed that pt had been taught twice and was not compliant with aseptic technique and they would not allow him to do it himself.  I again approached the option of SNF. Pt adamantly refuses SNF and states that he "just wants to get out of here". It was explained to pt that if he left without IV abx that he could die from his infection. He states that he is fine with that and everyone has to die sometime. This information was given to MD and medical director. Sandford Craze RN,BSN,NCM [phone]

## 2014-12-11 ENCOUNTER — Emergency Department (HOSPITAL_COMMUNITY)
Admit: 2014-12-11 | Discharge: 2014-12-11 | Disposition: A | Discharge status: Home / Self Care | Payer: Medicaid Other | Attending: Family Medicine | Admitting: Family Medicine

## 2014-12-11 ENCOUNTER — Telehealth: Payer: Self-pay

## 2014-12-11 ENCOUNTER — Ambulatory Visit: Payer: Medicaid Other | Attending: Family Medicine | Admitting: Family Medicine

## 2014-12-11 ENCOUNTER — Telehealth: Payer: Self-pay | Admitting: *Deleted

## 2014-12-11 ENCOUNTER — Encounter: Payer: Self-pay | Admitting: Family Medicine

## 2014-12-11 ENCOUNTER — Other Ambulatory Visit (HOSPITAL_COMMUNITY): Payer: Self-pay | Admitting: Internal Medicine

## 2014-12-11 ENCOUNTER — Emergency Department (HOSPITAL_COMMUNITY): Payer: Medicaid Other

## 2014-12-11 ENCOUNTER — Encounter (HOSPITAL_COMMUNITY): Payer: Self-pay

## 2014-12-11 ENCOUNTER — Inpatient Hospital Stay (HOSPITAL_COMMUNITY)
Admission: EM | Admit: 2014-12-11 | Discharge: 2014-12-18 | DRG: 469 | Disposition: A | Discharge status: Skilled Nursing Facility | Payer: Medicaid Other | Attending: Internal Medicine | Admitting: Internal Medicine

## 2014-12-11 ENCOUNTER — Inpatient Hospital Stay (HOSPITAL_COMMUNITY): Admission: RE | Admit: 2014-12-11 | Payer: Self-pay | Source: Ambulatory Visit

## 2014-12-11 VITALS — BP 120/71 | HR 104 | Temp 98.8°F | Ht 70.0 in | Wt 170.0 lb

## 2014-12-11 DIAGNOSIS — R7881 Bacteremia: Secondary | ICD-10-CM

## 2014-12-11 DIAGNOSIS — E1165 Type 2 diabetes mellitus with hyperglycemia: Secondary | ICD-10-CM | POA: Diagnosis present

## 2014-12-11 DIAGNOSIS — R300 Dysuria: Secondary | ICD-10-CM | POA: Insufficient documentation

## 2014-12-11 DIAGNOSIS — I10 Essential (primary) hypertension: Secondary | ICD-10-CM | POA: Diagnosis present

## 2014-12-11 DIAGNOSIS — M879 Osteonecrosis, unspecified: Secondary | ICD-10-CM | POA: Diagnosis present

## 2014-12-11 DIAGNOSIS — Z8249 Family history of ischemic heart disease and other diseases of the circulatory system: Secondary | ICD-10-CM

## 2014-12-11 DIAGNOSIS — N139 Obstructive and reflux uropathy, unspecified: Secondary | ICD-10-CM | POA: Diagnosis not present

## 2014-12-11 DIAGNOSIS — M25552 Pain in left hip: Secondary | ICD-10-CM | POA: Diagnosis not present

## 2014-12-11 DIAGNOSIS — Z833 Family history of diabetes mellitus: Secondary | ICD-10-CM

## 2014-12-11 DIAGNOSIS — B9562 Methicillin resistant Staphylococcus aureus infection as the cause of diseases classified elsewhere: Secondary | ICD-10-CM | POA: Diagnosis present

## 2014-12-11 DIAGNOSIS — D649 Anemia, unspecified: Secondary | ICD-10-CM | POA: Diagnosis present

## 2014-12-11 DIAGNOSIS — M1990 Unspecified osteoarthritis, unspecified site: Secondary | ICD-10-CM | POA: Diagnosis present

## 2014-12-11 DIAGNOSIS — Z794 Long term (current) use of insulin: Secondary | ICD-10-CM | POA: Diagnosis not present

## 2014-12-11 DIAGNOSIS — Z9119 Patient's noncompliance with other medical treatment and regimen: Secondary | ICD-10-CM

## 2014-12-11 DIAGNOSIS — M25559 Pain in unspecified hip: Secondary | ICD-10-CM | POA: Insufficient documentation

## 2014-12-11 DIAGNOSIS — I38 Endocarditis, valve unspecified: Secondary | ICD-10-CM | POA: Diagnosis present

## 2014-12-11 DIAGNOSIS — I330 Acute and subacute infective endocarditis: Secondary | ICD-10-CM | POA: Diagnosis present

## 2014-12-11 DIAGNOSIS — E162 Hypoglycemia, unspecified: Secondary | ICD-10-CM | POA: Insufficient documentation

## 2014-12-11 DIAGNOSIS — Z91199 Patient's noncompliance with other medical treatment and regimen due to unspecified reason: Secondary | ICD-10-CM

## 2014-12-11 DIAGNOSIS — IMO0002 Reserved for concepts with insufficient information to code with codable children: Secondary | ICD-10-CM

## 2014-12-11 DIAGNOSIS — M25452 Effusion, left hip: Secondary | ICD-10-CM | POA: Diagnosis present

## 2014-12-11 DIAGNOSIS — M009 Pyogenic arthritis, unspecified: Principal | ICD-10-CM | POA: Diagnosis present

## 2014-12-11 DIAGNOSIS — R609 Edema, unspecified: Secondary | ICD-10-CM | POA: Diagnosis present

## 2014-12-11 DIAGNOSIS — E11649 Type 2 diabetes mellitus with hypoglycemia without coma: Secondary | ICD-10-CM | POA: Diagnosis present

## 2014-12-11 DIAGNOSIS — D638 Anemia in other chronic diseases classified elsewhere: Secondary | ICD-10-CM | POA: Diagnosis present

## 2014-12-11 DIAGNOSIS — N419 Inflammatory disease of prostate, unspecified: Secondary | ICD-10-CM | POA: Diagnosis present

## 2014-12-11 DIAGNOSIS — Z79899 Other long term (current) drug therapy: Secondary | ICD-10-CM

## 2014-12-11 DIAGNOSIS — I2690 Septic pulmonary embolism without acute cor pulmonale: Secondary | ICD-10-CM | POA: Diagnosis present

## 2014-12-11 DIAGNOSIS — I76 Septic arterial embolism: Secondary | ICD-10-CM | POA: Diagnosis present

## 2014-12-11 LAB — POCT URINALYSIS DIPSTICK
Bilirubin, UA: NEGATIVE
Glucose, UA: NEGATIVE
Ketones, UA: NEGATIVE
Leukocytes, UA: NEGATIVE
Nitrite, UA: NEGATIVE
Protein, UA: >=300
SPEC GRAV UA: 1.025
Urobilinogen, UA: 0.2
pH, UA: 5

## 2014-12-11 LAB — CBC WITH DIFFERENTIAL/PLATELET
Basophils Absolute: 0.1 10*3/uL (ref 0.0–0.1)
Basophils Relative: 0 % (ref 0–1)
Eosinophils Absolute: 0.1 10*3/uL (ref 0.0–0.7)
Eosinophils Relative: 1 % (ref 0–5)
HCT: 28 % — ABNORMAL LOW (ref 39.0–52.0)
Hemoglobin: 8.6 g/dL — ABNORMAL LOW (ref 13.0–17.0)
Lymphocytes Relative: 12 % (ref 12–46)
Lymphs Abs: 1.6 10*3/uL (ref 0.7–4.0)
MCH: 24.8 pg — ABNORMAL LOW (ref 26.0–34.0)
MCHC: 30.7 g/dL (ref 30.0–36.0)
MCV: 80.7 fL (ref 78.0–100.0)
Monocytes Absolute: 0.9 10*3/uL (ref 0.1–1.0)
Monocytes Relative: 7 % (ref 3–12)
Neutro Abs: 11 10*3/uL — ABNORMAL HIGH (ref 1.7–7.7)
Neutrophils Relative %: 80 % — ABNORMAL HIGH (ref 43–77)
Platelets: 480 10*3/uL — ABNORMAL HIGH (ref 150–400)
RBC: 3.47 MIL/uL — ABNORMAL LOW (ref 4.22–5.81)
RDW: 14.9 % (ref 11.5–15.5)
WBC: 13.6 10*3/uL — ABNORMAL HIGH (ref 4.0–10.5)

## 2014-12-11 LAB — BASIC METABOLIC PANEL
Anion gap: 6 (ref 5–15)
BUN: 12 mg/dL (ref 6–20)
CO2: 26 mmol/L (ref 22–32)
Calcium: 8.3 mg/dL — ABNORMAL LOW (ref 8.9–10.3)
Chloride: 101 mmol/L (ref 101–111)
Creatinine, Ser: 1.08 mg/dL (ref 0.61–1.24)
GFR calc Af Amer: >60 mL/min (ref >60)
GFR calc non Af Amer: >60 mL/min (ref >60)
Glucose, Bld: 331 mg/dL — ABNORMAL HIGH (ref 65–99)
Potassium: 4 mmol/L (ref 3.5–5.1)
Sodium: 133 mmol/L — ABNORMAL LOW (ref 135–145)

## 2014-12-11 LAB — SYNOVIAL CELL COUNT + DIFF, W/ CRYSTALS
Crystals, Fluid: NONE SEEN
Lymphocytes-Synovial Fld: 10 % (ref 0–20)
Monocyte-Macrophage-Synovial Fluid: 2 % — ABNORMAL LOW (ref 50–90)
NEUTROPHIL, SYNOVIAL: 88 % — AB (ref 0–25)
WBC, Synovial: UNDETERMINED /mm3 (ref 0–200)

## 2014-12-11 MED ORDER — HYDROMORPHONE HCL 1 MG/ML IJ SOLN
1.0000 mg | Freq: Once | INTRAMUSCULAR | Status: AC
  Administered 2014-12-11: 1 mg via INTRAVENOUS
  Filled 2014-12-11: qty 1

## 2014-12-11 MED ORDER — VANCOMYCIN HCL 10 G IV SOLR
1500.0000 mg | Freq: Once | INTRAVENOUS | Status: AC
  Administered 2014-12-11: 1500 mg via INTRAVENOUS
  Filled 2014-12-11: qty 1500

## 2014-12-11 MED ORDER — LINEZOLID 600 MG PO TABS: 600.0000 mg | ORAL_TABLET | Freq: Two times a day (BID) | ORAL | Status: DC

## 2014-12-11 MED ORDER — ONDANSETRON HCL 4 MG/2ML IJ SOLN
4.0000 mg | Freq: Once | INTRAMUSCULAR | Status: AC
  Administered 2014-12-11: 4 mg via INTRAVENOUS
  Filled 2014-12-11: qty 2

## 2014-12-11 MED ORDER — GADOBENATE DIMEGLUMINE 529 MG/ML IV SOLN
15.0000 mL | Freq: Once | INTRAVENOUS | Status: AC | PRN
  Administered 2014-12-11: 15 mL via INTRAVENOUS

## 2014-12-11 NOTE — ED Notes (Signed)
Spoke to IV team RN whom reports PICC placement may not be completed today. IR contacted as well and they are unable to complete today.

## 2014-12-11 NOTE — Telephone Encounter (Signed)
Called to speak with patient about his leaving AMA from the hospital and his not getting his IV antibiotics.  Patient spoke to me briefly and then handed phone to his friend Leitha Bleak who said patient was listening via speaker phone.  She states patient does want to go to SNF to receive treatment and that [facility] is "working on it." This RN spoke to case manager Peterson Lombard at [facility] who is checking into this.  Explained to Ms. Milton that Dr. Venetia Night had spoken to the Infectious Disease MD who stated that we could try a by mouth antibiotic.  I explained that the best course of treatment was the IV antibiotics but if patient was unwilling the oral medication would be an option although not the first line of defense.  Spoke to Dr. Venetia Night who wants patient to come in immediately to discuss this.  Ms Stephannie Peters agreed and will bring patient.

## 2014-12-11 NOTE — ED Notes (Signed)
Pt is in IR

## 2014-12-11 NOTE — Progress Notes (Signed)
CSW consulted with EDP regarding pt admission status. EDP, [place] asked if facility would be able to take the pt back with a peripheral line.  CSW reached out to [facility] lia[facility] and asked her if the pt would be welcomed to come to facility tonight with peripheral line. However, the facility declined. Liaison states that they would not be able to take the pt back tonight if PICC cannot be placed today. Also, she states that due to not having a 24 hour notice for transportation the facility would not be able to take patient tonight.   [facility]ia[facility] states they will be able to take the pt back tomorrow 12/12/1014 once the PICC line has been completed.  CSW made EDP/ Linker aware.  CSW will call patient's girlfriend to inquire if she will be able to assist with transportation.  Trish Mage [phone] ED CSW 12/11/2014 6:48 PM

## 2014-12-11 NOTE — Clinical Social Work Note (Signed)
Clinical Social Work Assessment  Patient Details  Name: Marc [name] MRN: [id number] Date of Birth: September 04, 1960  Date of referral:  12/11/14               Reason for consult:  Facility Placement                Permission sought to share information with:  Family Supports, Magazine features editor Permission granted to share information::  Yes, Verbal Permission Granted  Name::     Leitha Bleak   Agency::  [place] [facility]   Relationship::  girlfriend  Contact Information:  [phone]  Housing/Transportation Living arrangements for the past 2 months:  Homeless, No permanent address, Single Family Home Source of Information:  Patient, Partner Patient Interpreter Needed:  None Criminal Activity/Legal Involvement Pertinent to Current Situation/Hospitalization:  No - Comment as needed Significant Relationships:  Significant Other, Community Support Lives with:  Self, Significant Other, Parents Do you feel safe going back to the place where you live?  Yes Need for family participation in patient care:  Yes (Comment)  Care giving concerns:  Patient needing snf placement for long term iv antibiotics and picc line.    Social Worker assessment / plan:  CSW spoke iwht pt by phone who gave csw permission to speak with pt girlfriend. Pt and pt girlfriend discussed needing for long term iv antibiotics. Pt shared he left the hospital ama and regrets deicsion and is now open to snf placement. Pt open to any placement available. Patient states,"i've learn my lesson I have to face the facts that I am sick and I've got to complete treatment. My life is in jeopardy."   Employment status:  Unemployed Insurance information:  Self Pay (Medicaid Pending) PT Recommendations:  Not assessed at this time Information / Referral to community resources:     Patient/Family's Response to care:  Pt and pt family thanked csw for concern and support and hospital willingness to assist patient.    Patient/Family's Understanding of and Emotional Response to Diagnosis, Current Treatment, and Prognosis:  Pt verbalized understanding of seriousness of condition, and importance of completing treatment.  Emotional Assessment Appearance:  Appears older than stated age, Disheveled Attitude/Demeanor/Rapport:   (calm and content, ) Affect (typically observed):  Accepting (hopeful, remorse of decisions) Orientation:  Oriented to Self, Oriented to Place, Oriented to  Time, Oriented to Situation Alcohol / Substance use:  Not Applicable Psych involvement (Current and /or in the community):  No (Comment)  Discharge Needs  Concerns to be addressed:  Discharge Planning Concerns Readmission within the last 30 days:  Yes Current discharge risk:  Other (pt previous dc ama, refusal of snf placement, now agreeable) Barriers to Discharge:  Barriers Resolved   Vanna Shavers A, LCSW 12/11/2014, 2:56 PM

## 2014-12-11 NOTE — Clinical Social Work Placement (Signed)
   CLINICAL SOCIAL WORK PLACEMENT  NOTE  Date:  12/11/2014  Patient Details  Name: BRAXDEN LOVERING MRN: [id number] Date of Birth: 09-14-1960  Clinical Social Work is seeking post-discharge placement for this patient at the Skilled  Nursing Facility level of care (*CSW will initial, date and re-position this form in  chart as items are completed):  Yes   Patient/family provided with [place] Clinical Social Work Department's list of facilities offering this level of care within the geographic area requested by the patient (or if unable, by the patient's family).  Yes   Patient/family informed of their freedom to choose among providers that offer the needed level of care, that participate in Medicare, Medicaid or managed care program needed by the patient, have an available bed and are willing to accept the patient.  Yes   Patient/family informed of [facility]'s ownership interest in [facility] and [facility], as well as of the fact that they are under no obligation to receive care at these facilities.  PASRR submitted to EDS on 12/11/14     PASRR number received on 12/11/14     Existing PASRR number confirmed on       FL2 transmitted to all facilities in geographic area requested by pt/family on 12/11/14     FL2 transmitted to all facilities within larger geographic area on       Patient informed that his/her managed care company has contracts with or will negotiate with certain facilities, including the following:        Yes   Patient/family informed of bed offers received.  Patient chooses bed at [facility]     Physician recommends and patient chooses bed at      Patient to be transferred to [facility] on  .  Patient to be transferred to facility by       Patient family notified on   of transfer.  Name of family member notified:        PHYSICIAN       Additional Comment:     _______________________________________________ Duke Salvia, LCSW 12/11/2014, 2:59 PM

## 2014-12-11 NOTE — ED Provider Notes (Signed)
CSN: [id number]     Arrival date & time 12/11/14  1414 History   First MD Initiated Contact with Patient 12/11/14 1420     Chief Complaint  Patient presents with  . Hip Pain  . PICC Line Placement      (Consider location/radiation/quality/duration/timing/severity/associated sxs/prior Treatment) HPI   54yM with L hip pain. Pt reports worsening since left hospital AMA yesterday. Recently diagnosed with endocarditis/septic emboli. Admitted and discharged twice at this point. Currently no IV access and not getting abx since last seen. He reports severe pain in L hip/groin which is worse with any movement. No fever or chills. No respiratory complaints.   Past Medical History  Diagnosis Date  . Diabetes mellitus without complication   . Hypertension   . Endocarditis    Past Surgical History  Procedure Laterality Date  . No past surgeries    . Video bronchoscopy Bilateral 12/03/2014    Procedure: VIDEO BRONCHOSCOPY WITHOUT FLUORO;  Surgeon: Chesley Mires, MD;  Location: [facility];  Service: Cardiopulmonary;  Laterality: Bilateral;   Family History  Problem Relation Age of Onset  . Hypertension Mother   . Diabetes Sister   . Diabetes Brother    History  Substance Use Topics  . Smoking status: Never Smoker   . Smokeless tobacco: Never Used  . Alcohol Use: No     Comment: patient states he does not drink on 12/08/14    Review of Systems  All systems reviewed and negative, other than as noted in HPI.   Allergies  Review of patient's allergies indicates no known allergies.  Home Medications   Prior to Admission medications   Medication Sig Start Date End Date Taking? Authorizing Provider  acetaminophen (TYLENOL) 500 MG tablet Take 500 mg by mouth every 6 (six) hours as needed for mild pain.    Historical Provider, MD  alum & mag hydroxide-simeth (MAALOX/MYLANTA) 200-200-20 MG/5ML suspension Take 30 mLs by mouth every 6 (six) hours as needed for indigestion or  heartburn. Patient not taking: Reported on 12/11/2014 11/22/14   Robbie Lis, MD  amLODipine (NORVASC) 10 MG tablet Take 1 tablet (10 mg total) by mouth daily. Patient not taking: Reported on 12/08/2014 12/08/14   Arnoldo Morale, MD  Blood Glucose Monitoring Suppl (TRUE METRIX METER) W/DEVICE KIT 1 each by Does not apply route 4 (four) times daily - after meals and at bedtime. Patient not taking: Reported on 12/11/2014 12/08/14   Arnoldo Morale, MD  folic acid (FOLVITE) 1 MG tablet Take 1 tablet (1 mg total) by mouth daily. Patient not taking: Reported on 12/11/2014 11/22/14   Robbie Lis, MD  glimepiride (AMARYL) 2 MG tablet Take 1 tablet (2 mg total) by mouth daily with breakfast. Patient not taking: Reported on 12/08/2014 12/04/14   Velta Addison Mikhail, DO  glucose blood test strip Use as instructed Patient not taking: Reported on 12/08/2014 11/22/14   Robbie Lis, MD  glucose blood test strip Use as instructed Patient not taking: Reported on 12/08/2014 12/07/14   Arnoldo Morale, MD  glucose blood test strip 1 each by Other route 4 (four) times daily -  before meals and at bedtime. Use as instructed Patient not taking: Reported on 12/11/2014 12/08/14   Arnoldo Morale, MD  insulin aspart (NOVOLOG) 100 UNIT/ML injection Inject 7 Units into the skin 3 (three) times daily with meals. Patient not taking: Reported on 11/30/2014 11/22/14   Robbie Lis, MD  insulin glargine (LANTUS) 100 UNIT/ML injection Inject 0.35 mLs (35  Units total) into the skin at bedtime. Patient not taking: Reported on 11/30/2014 11/22/14   Robbie Lis, MD  linezolid (ZYVOX) 600 MG tablet Take 1 tablet (600 mg total) by mouth 2 (two) times daily. 12/11/14   Arnoldo Morale, MD  lisinopril (PRINIVIL,ZESTRIL) 5 MG tablet Take 1 tablet (5 mg total) by mouth daily. Patient not taking: Reported on 12/08/2014 12/08/14   Arnoldo Morale, MD  Multiple Vitamin (MULTIVITAMIN WITH MINERALS) TABS tablet Take 1 tablet by mouth daily. Patient not taking: Reported on  11/30/2014 11/22/14   Robbie Lis, MD  pantoprazole (PROTONIX) 40 MG tablet Take 1 tablet (40 mg total) by mouth daily. Patient not taking: Reported on 12/11/2014 11/22/14   Robbie Lis, MD  tamsulosin (FLOMAX) 0.4 MG CAPS capsule Take 1 capsule (0.4 mg total) by mouth daily after supper. Patient not taking: Reported on 12/11/2014 11/22/14   Robbie Lis, MD  thiamine 100 MG tablet Take 1 tablet (100 mg total) by mouth daily. Patient not taking: Reported on 12/08/2014 11/22/14   Robbie Lis, MD  TRUEPLUS LANCETS 30G MISC 1 each by Does not apply route 4 (four) times daily - after meals and at bedtime. Patient not taking: Reported on 12/11/2014 12/08/14   Arnoldo Morale, MD  vancomycin (VANCOCIN) 1 GM/200ML SOLN Inject 300 mLs (1,500 mg total) into the vein every 12 (twelve) hours. Per home health protocol Patient not taking: Reported on 12/08/2014 12/04/14   Maryann Mikhail, DO   BP 113/59 mmHg  Pulse 97  Temp(Src) 98.2 F (36.8 C) (Oral)  Resp 16  SpO2 99% Physical Exam  Constitutional: He appears well-developed and well-nourished. No distress.  HENT:  Head: Normocephalic and atraumatic.  Eyes: Conjunctivae are normal. Right eye exhibits no discharge. Left eye exhibits no discharge.  Neck: Neck supple.  Cardiovascular: Normal rate, regular rhythm and normal heart sounds.  Exam reveals no gallop and no friction rub.   No murmur heard. Pulmonary/Chest: Effort normal and breath sounds normal. No respiratory distress.  Abdominal: Soft. He exhibits no distension. There is no tenderness.  Musculoskeletal:  L hip grossly normal in appearance. Severe pain with both active and passive ROM. NVI.   Neurological: He is alert.  Skin: Skin is warm and dry.  Psychiatric: He has a normal mood and affect. His behavior is normal. Thought content normal.  Nursing note and vitals reviewed.   ED Course  Procedures (including critical care time) Labs Review Labs Reviewed  CBC WITH DIFFERENTIAL/PLATELET -  Abnormal; Notable for the following:    WBC 13.6 (*)    RBC 3.47 (*)    Hemoglobin 8.6 (*)    HCT 28.0 (*)    MCH 24.8 (*)    Platelets 480 (*)    Neutrophils Relative % 80 (*)    Neutro Abs 11.0 (*)    All other components within normal limits  BASIC METABOLIC PANEL - Abnormal; Notable for the following:    Sodium 133 (*)    Glucose, Bld 331 (*)    Calcium 8.3 (*)    All other components within normal limits  CULTURE, BLOOD (ROUTINE X 2)  CULTURE, BLOOD (ROUTINE X 2)    Imaging Review Dg Hip Unilat With Pelvis 2-3 Views Left  12/11/2014   CLINICAL DATA:  Lateral left hip pain for 2-3 days. No known injury. Initial encounter.  EXAM: DG HIP (WITH OR WITHOUT PELVIS) 2-3V LEFT  COMPARISON:  None.  FINDINGS: There is no acute bony or joint  abnormality. No evidence of avascular necrosis of the femoral heads is identified. No notable degenerative change is seen about the hips, symphysis pubis or sacroiliac joints. Imaged soft tissue structures are unremarkable.  IMPRESSION: Negative exam.   Electronically Signed   By: Inge Rise M.D.   On: 12/11/2014 15:05     EKG Interpretation None      MDM   Final diagnoses:  Left hip pain  Hip pain  Endocarditis    54yM with endocarditis and noncompliance with treatment. In and out of hospital recently. Most recently left AMA yesterday. Now with atraumatic L hip pain. Seems to have significant pain with both passive and active ROM. Concern for possible septic joint. Will MR. Able to get patient placed at [facility]. Also needs PICC but unavailable to be placed today. Will see if can accept patient with peripheral IV and arrange for outpatient PICC placement. If MR suggestive of septic arthritis or cannot send to [facility] with PIV, then will need to be admitted.      Virgel Manifold, MD 12/11/14 [phone]

## 2014-12-11 NOTE — ED Notes (Addendum)
Pt c/o L hip pain x 3 days and need for PICC line placement.  Pain score 5/10.  Ranga MD and CSW want Pt to check in to r/o a septic L hip.  Pt was recently admitted for endocarditis, but left AMA yesterday.  Per CSW, Pt is going to be placed in SNF, but needs PICC line placement for long term antibiotics.  Pt was seen at PCP today and ordered an Outpatient DG Arthro L Hip.  ED radiology reports that this imaging must be scheduled or completed during Inpatient admission.

## 2014-12-11 NOTE — ED Notes (Signed)
Patient transported to MRI 

## 2014-12-11 NOTE — ED Notes (Signed)
Pt wants to eat but per Dr Karma Ganja pt is not able to eat,  He is upset and ask me to leave room since he can't eat.  This Clinical research associate apologized to pt but he was still mad because he can't eat

## 2014-12-11 NOTE — Progress Notes (Addendum)
1935 CM spoke with pt with ED RN, Marta Antu present Cm asked pt if he understood his condition, risks and need for treatment Pt able to tell pt that "I have and infection around my heart, I need six weeks of antibiotics or I'm going to die"  Pt states his "pastor opened my eyes and comforted me" "I'm ready now to do this" Pt informed CM he would not leave [facility] and would go to snf on "tomorrow for six weeks"  1929 EDP states MRI indicate septic arthritis of left hip, calling [facility] hospitalist for admission and for PICC line placement 12/12/14 1815 Cm spoke with medical director after leaving a message. He was updated that [facility] has agreed to take pt but prefers transfer 12/12/14 and ED SW speaking with EDP about this presently CM had confirmed with EDP, Linker that IR was contacted and PICC can be place at or after 9 am on 12/12/14 at [facility].  CM present while ED SW spoke with [facility] staff about pt placement for 12/11/14 1657 Cm spoke with amy of IR to confirm IR closes at 5 pm but if EDP calles IR MD, Lavone Neri to get PICC approved IR services may be able to complete PICC on 12/12/14 for emergency PICC placement. ED Cm provided EDP Linker with IR MD page [phone] to discuss this with him. ED SW updated Pt for snf placement at [facility] via LOG  1643 Consulted by ED Sw about pt not returning to facility without a PIC line called interventional radiology to attempt to find out of a PICC can be placed No answer Cm called dx [phone] and left a voice message with staff to have someone return a call to CM This pt has had 3 ED visits and 3 admission in the last 25 days PMH endocarditis, MRSA with need of total of 6 weeks of iv antibiotics

## 2014-12-11 NOTE — Progress Notes (Signed)
Subjective:    Patient ID: Marc [name], male    DOB: 09-30-1960, 54 y.o.   MRN: [id number]  HPI  Marc [name] is a 54 year old male with a history of uncontrolled type 2 diabetes mellitus (A1c 12.1), hypertension, urinary outlet obstruction, MRSA bacteremia with septic emboli, presumptive endocarditis who was discharged on 12/04/14 with a PICC line and was supposed to receive IV vancomycin every 12 hours which he never received due to difficulty obtaining a caregiver who was willing to administer the medication.  Blood culture from 11/21/14 grew MRSA and he never presented back to the hospital for direct admission after called by the hospitalist. On presentation he was found to be tachycardic, chest x-ray showed possible septic emboli and CT chest revealed multiple pulmonary nodules with cavitation compatible with septic emboli although differentials could include metastasis, TB or fungal infection, small right pleural effusion and so he was placed on empiric Vanc and Zosyn. Repeat blood cultures, urine cultures revealed no growth; AFB was negative, BAL revealed GPC on gram stain negative for fungus or yeast. 2-D echo revealed EF of 55-60%, no vegetation and TEE was to be scheduled as an outpatient.  I had seen him at the last office on visit on 12/08/14  and his concern had been for me to have his Foley catheter removed which had been inserted secondary to bladder outlet obstruction during his previous hospitalization in which he was treated for sepsis secondary to UTI and pneumonia. I had referred him back to [facility] for readmission and arrangement of home nursing for administration of his IV Vancomycin but he left AMA as his niece who had previously agreed to perform the infusion never showed up and he refused going to an SNF.  Interval history: He was called to come into the clinic today so he could be treated with linezolid (after speaking with Dr Linus Salmons -Infectious disease)  given failed attempts at placing him on IV Vancomycin and his PICC line had been taken out and he was yet to be treated for MRSA bacteremia with presumptive endocarditis. Today he tells me he left AMA he feels he was depressed about going to an SNF. He also complains of left hip pain which was absent at the last visit has been on for 3 days to the extent that he is unable to bear weight and is sitting in a wheelchair. Denies history of trauma. Also complains of dysuria.  Past Medical History  Diagnosis Date  . Diabetes mellitus without complication   . Hypertension   . Endocarditis    Past Surgical History  Procedure Laterality Date  . No past surgeries    . Video bronchoscopy Bilateral 12/03/2014    Procedure: VIDEO BRONCHOSCOPY WITHOUT FLUORO;  Surgeon: Chesley Mires, MD;  Location: [facility];  Service: Cardiopulmonary;  Laterality: Bilateral;    History   Social History  . Marital Status: Single    Spouse Name: N/A  . Number of Children: N/A  . Years of Education: N/A   Occupational History  . Not on file.   Social History Main Topics  . Smoking status: Never Smoker   . Smokeless tobacco: Never Used  . Alcohol Use: No     Comment: patient states he does not drink on 12/08/14  . Drug Use: No  . Sexual Activity: Not on file   Other Topics Concern  . Not on file   Social History Narrative    No Known Allergies  No current  facility-administered medications on file prior to visit.   Current Outpatient Prescriptions on File Prior to Visit  Medication Sig Dispense Refill  . alum & mag hydroxide-simeth (MAALOX/MYLANTA) 200-200-20 MG/5ML suspension Take 30 mLs by mouth every 6 (six) hours as needed for indigestion or heartburn. 355 mL 0  . amLODipine (NORVASC) 10 MG tablet Take 1 tablet (10 mg total) by mouth daily. 30 tablet 2  . Blood Glucose Monitoring Suppl (TRUE METRIX METER) W/DEVICE KIT 1 each by Does not apply route 4 (four) times daily - after meals and at bedtime.  1 kit 0  . folic acid (FOLVITE) 1 MG tablet Take 1 tablet (1 mg total) by mouth daily. 30 tablet 0  . glimepiride (AMARYL) 2 MG tablet Take 1 tablet (2 mg total) by mouth daily with breakfast. 30 tablet 0  . glucose blood test strip Use as instructed (Patient not taking: Reported on 12/11/2014) 100 each 0  . glucose blood test strip Use as instructed 100 each 12  . glucose blood test strip 1 each by Other route 4 (four) times daily -  before meals and at bedtime. Use as instructed 100 each 6  . insulin aspart (NOVOLOG) 100 UNIT/ML injection Inject 7 Units into the skin 3 (three) times daily with meals. 10 mL 0  . insulin glargine (LANTUS) 100 UNIT/ML injection Inject 0.35 mLs (35 Units total) into the skin at bedtime. 10 mL 0  . lisinopril (PRINIVIL,ZESTRIL) 5 MG tablet Take 1 tablet (5 mg total) by mouth daily. (Patient not taking: Reported on 12/11/2014) 30 tablet 3  . Multiple Vitamin (MULTIVITAMIN WITH MINERALS) TABS tablet Take 1 tablet by mouth daily. 30 tablet 0  . pantoprazole (PROTONIX) 40 MG tablet Take 1 tablet (40 mg total) by mouth daily. 30 tablet 0  . tamsulosin (FLOMAX) 0.4 MG CAPS capsule Take 1 capsule (0.4 mg total) by mouth daily after supper. (Patient not taking: Reported on 12/11/2014) 30 capsule 0  . thiamine 100 MG tablet Take 1 tablet (100 mg total) by mouth daily. (Patient not taking: Reported on 12/08/2014) 30 tablet 0  . TRUEPLUS LANCETS 30G MISC 1 each by Does not apply route 4 (four) times daily - after meals and at bedtime. (Patient not taking: Reported on 12/11/2014) 100 each 5  . vancomycin (VANCOCIN) 1 GM/200ML SOLN Inject 300 mLs (1,500 mg total) into the vein every 12 (twelve) hours. Per home health protocol (Patient not taking: Reported on 12/08/2014) 4000 mL 0      Review of Systems  General: negative for fever, weight loss, appetite change Eyes: no visual symptoms. ENT: no ear symptoms, no sinus tenderness, no nasal congestion or sore throat. Neck: no pain    Respiratory: no wheezing, shortness of breath, cough Cardiovascular: no chest pain, no dyspnea on exertion, no pedal edema, no orthopnea. Gastrointestinal: no abdominal pain, no diarrhea, no constipation Genito-Urinary: urinary retention, dysuria Hematologic: no bruising Endocrine: no cold or heat intolerance Neurological: no headaches, no seizures, no tremors Musculoskeletal: left hip pain Skin: no pruritus, no rash. Psychological: no depression, no anxiety,       Objective: Filed Vitals:   12/11/14 1059  BP: 120/71  Pulse: 104  Temp: 98.8 F (37.1 C)  Height: 5' 10"  (1.778 m)  Weight: 170 lb (77.111 kg)  SpO2: 100%      Physical Exam  Constitutional: normal appearing,  Neck: normal range of motion, no thyromegaly, no JVD Cardiovascular: tachycardic rate and rhythm, normal heart sounds, no murmurs, rub or gallop,  no pedal edema Respiratory: clear to auscultation bilaterally, no wheezes, no rales, no rhonchi Abdomen: soft, not tender to palpation, normal bowel sounds, no enlarged organs Extremities: severely tender left hip, unable to elevate L hip beyond 30 degrees, drags LLE while holding onto the table to walk. Skin: warm and dry, no lesions. Neurological: alert, oriented x3, cranial nerves I-XII grossly intact , normal motor strength, normal sensation. Psychological: normal mood.         Assessment & Plan:  54 year old male with a history of uncontrolled type 2 diabetes mellitus (A1c 12.1), hypertension, urinary outlet obstruction, non compliance with medical treatment, MRSA bacteremia with septic emboli, presumptive endocarditis currently not on treatment due to non compliance and leaving AMA now with new onset Left hip symptoms.  Left hip pain: I am concerned that he may have septic arthritis of the left hip due to above risk factors and so I have referred him for left hip x-ray.Marland Kitchen   MRSA bacteremia/ Septic embolism/ presumptive endocarditis: Currently  tachycardic, otherwise stable. CBC, CMET sent off During his visit we were informed by the Zacarias Pontes case manager at that he would need to go to [facility] ED to be evaluated for placement at [facility].  Patient advised to go over there after his visit here meanwhile I have ordered linezolid (even though IV vancomycin would be the medication of choice) for him in the event that he is unable to be placed today.  I will see him back in 3 days for coordination of care   Unontrolled type 2 diabetes mellitus: A1c of 12.1 due to noncompliance. I will review his blood sugar log at his next office visit and make medication changes as needed once his situation with SNF has been sorted out.  Hypertension: Controlled Continue Amlodipine  Bladder outlet obstruction/ obstructive uropathy: UA sent off from the tubing, negative for UTI He is so irritated about the fact that he has a Foley in place; I have referred him to Urology.  This note has been created with Surveyor, quantity. Any transcriptional errors are unintentional.    Arnoldo Morale, MD. [facility] [phone] 12/08/2014, 4:25 PM

## 2014-12-11 NOTE — ED Notes (Signed)
Patient transported to X-ray 

## 2014-12-11 NOTE — ED Notes (Signed)
This Consulting civil engineer spoke w/ Engineer, drilling and inform that they would not take the Pt tonight, per the Director of Nursing.  Sts they will not have an Admission RN tonight.

## 2014-12-11 NOTE — Telephone Encounter (Signed)
Received call from Olga Coaster, SW at [facility], requesting clarification about patient. She indicates patient at [facility],  and patient explained X-Ray needed of hip. Patient seen today by Dr. Venetia Night at [facility] and [facility]. Spoke with Dr. Venetia Night who indicates she wants patient to have a X-ray of left hip to rule out septic arthritis because patient unable to bear weight or lift leg.  Return call placed back to Olga Coaster, SW to explain patient does need X-ray done today. She verbalized understanding and also indicates she will have to get patient to outpatient IR to get PICC placed.  Olga Coaster indicates she will continue working on SNF placement for patient.

## 2014-12-11 NOTE — Progress Notes (Signed)
Quick Note:  Labs addressed at office as well as medications and patient was made aware. ______ 

## 2014-12-11 NOTE — ED Notes (Signed)
Bed: WA03 Expected date:  Expected time:  Means of arrival:  Comments: Septic? Hip?

## 2014-12-11 NOTE — ED Provider Notes (Signed)
5:01 PM d/w Dr. Yamagata, radiology.  Pt can have PICC line placed tomorrow, Fredia Sorrow is coming from [facility]- he should be at [facility] radiology- to check/in register at 9am, as the schedule is likely to be better at [facility].  However, a team will need to be called in whether he has it at [facility] or [facility].   6:00PM- per social work, pt is not able to go to [facility] until after [employer] tomorrow  8:00 PM- d/w ortho, Dr. Dion Saucier, due to possible septic arthritis on MRI.  He requests IR do a joint aspiration and he will consult on the patient.    8:26 PM I have spoken with radiology tech, she states DG does the joint aspirations and not IR.  She states this will be done tomorrow, I have let her know that the joint aspiration needs to be performed tonight.  She states she will work on this.   8:35 PM I have talked with radiology in the reading room at [phone], he states he will find out who needs to do this procedure and will call me back.   9:00 PM per radiology, Dr. Fredia Sorrow is going to come in to do the hip aspiration  11:14 PM pt is back from IR, fluid is obtained and lab has the fluid.    12:40 AM d/w Dr. Dion Saucier, he will consult on the patient tonight- he has reviewed the chart and is still coming to evaluate patient.  He states he is able to eat/drink as he does not plan to take patient to the OR tonight.   1am- case d/w Dr. Bebe Shaggy and care transferred.   Jerelyn Scott, MD 12/13/14 [phone]

## 2014-12-11 NOTE — Telephone Encounter (Signed)
Received call from Lanier Clam, RN CM at [facility], that Olga Coaster [phone]) working on finding SNF placement for patient.  This Case Manager spoke with Baxter Hire who confirmed she is currently looking for SNF placement for patient. Updated Baxter Hire that patient's PICC line pulled before he left hospital on 12/11/14.  Will need new PICC line placed prior to going to SNF.  She verbalized understanding. Patient to see Dr. Venetia Night at [facility] today-12/12/14.

## 2014-12-11 NOTE — ED Notes (Signed)
Pt is alert and oriented in NAD,  Denies pain presently,  Was asleep upon walking into room,  Vital signs stable

## 2014-12-11 NOTE — Progress Notes (Signed)
CSW consulted for skilled nursing facility placement from home. Pt in needed of 6 weeks IV antibiotics of vancomysn 1500 mg q12. Patient unable to receive iv abx at home with home health. Patient sent from [facility] for left hip xray to r/o septic arthritis. Patient complaining of left leg/hip pain and unable to walk well. Patient in need of picc line for long term abx.   CSW attempted to inform hospitalist who is assiting with pt skilled nursing facility placement.   Pt plans to go to [facility] once medically stable for IV abx under letter of guarantee.

## 2014-12-11 NOTE — Care Management Note (Signed)
Case Management Note  Patient Details  Name: Marc [name] MRN: [id number] Date of Birth: Dec 07, 1960  Subjective/Objective:   Contacted by [facility] rep Marc [name]-patient(Marc [name])  signed out AMA yesterday but girlfriend Marc [name] W#[phone] called the hospital & said patient agrees to SNF. I have contacted Vernona Rieger & explained that we will try to have patient placed to a SNF. [name] on speaker phone confirmed he is now in agreement to SNF.I have contacted our ED CSW Olga Coaster V#[phone] who will try to find a SNF, I have given her the contact c# of Vernona Rieger, also contacted [facility] rep Shanda Bumps, informed her to contact Olga Coaster to coordinate SNF placement.[facility] notified of current status.               Action/Plan:   Expected Discharge Date:   (unknown)               Expected Discharge Plan:  Home w Home Health Services  In-House Referral:     Discharge planning Services  CM Consult  Post Acute Care Choice:    Choice offered to:  Patient  DME Arranged:    DME Agency:     HH Arranged:    HH Agency:  [facility]  Status of Service:  In process, will continue to follow  Medicare Important Message Given:    Date Medicare IM Given:    Medicare IM give by:    Date Additional Medicare IM Given:    Additional Medicare Important Message give by:     If discussed at Long Length of Stay Meetings, dates discussed:    Additional Comments:  Lanier Clam, RN 12/11/2014, 10:17 AM

## 2014-12-11 NOTE — Procedures (Signed)
Interventional Radiology Procedure Note  Procedure:  Left hip joint aspiration/lavage under fluoroscopy  Complications:  None  Estimated Blood Loss: < 10 mL  Findings:  No return of significant fluid from left hip joint w/ aspiration via 22 G and 20 G spinal needles.  Slight amount of blood aspirated. Joint lavaged with 10 mL of sterile saline.  Total of 4 mL lavage fluid recovered and sent for studies.  Jodi Marble. Fredia Sorrow, M.D Pager:  [phone]

## 2014-12-11 NOTE — Progress Notes (Signed)
Follow up on endocarditis Patient report left hip pain 5/10 aching that started 2-3 days ago-he is having to use a wheelchair to get around because of the pain and unsteadiness

## 2014-12-11 NOTE — ED Notes (Signed)
Pt returned from IR,  Pt is alert and oriented in NAD,  Pt is on cardiac monitor

## 2014-12-12 ENCOUNTER — Other Ambulatory Visit (HOSPITAL_COMMUNITY): Payer: Self-pay

## 2014-12-12 DIAGNOSIS — I76 Septic arterial embolism: Secondary | ICD-10-CM | POA: Diagnosis present

## 2014-12-12 DIAGNOSIS — M009 Pyogenic arthritis, unspecified: Secondary | ICD-10-CM | POA: Diagnosis present

## 2014-12-12 DIAGNOSIS — D649 Anemia, unspecified: Secondary | ICD-10-CM | POA: Diagnosis present

## 2014-12-12 DIAGNOSIS — E1165 Type 2 diabetes mellitus with hyperglycemia: Secondary | ICD-10-CM | POA: Diagnosis present

## 2014-12-12 DIAGNOSIS — E11649 Type 2 diabetes mellitus with hypoglycemia without coma: Secondary | ICD-10-CM | POA: Diagnosis present

## 2014-12-12 DIAGNOSIS — I10 Essential (primary) hypertension: Secondary | ICD-10-CM | POA: Diagnosis present

## 2014-12-12 DIAGNOSIS — M879 Osteonecrosis, unspecified: Secondary | ICD-10-CM | POA: Diagnosis present

## 2014-12-12 DIAGNOSIS — Z9119 Patient's noncompliance with other medical treatment and regimen: Secondary | ICD-10-CM

## 2014-12-12 DIAGNOSIS — D638 Anemia in other chronic diseases classified elsewhere: Secondary | ICD-10-CM | POA: Diagnosis present

## 2014-12-12 DIAGNOSIS — Z794 Long term (current) use of insulin: Secondary | ICD-10-CM | POA: Diagnosis not present

## 2014-12-12 DIAGNOSIS — M1990 Unspecified osteoarthritis, unspecified site: Secondary | ICD-10-CM | POA: Diagnosis present

## 2014-12-12 DIAGNOSIS — M25559 Pain in unspecified hip: Secondary | ICD-10-CM | POA: Insufficient documentation

## 2014-12-12 DIAGNOSIS — Z833 Family history of diabetes mellitus: Secondary | ICD-10-CM | POA: Diagnosis not present

## 2014-12-12 DIAGNOSIS — Z79899 Other long term (current) drug therapy: Secondary | ICD-10-CM | POA: Diagnosis not present

## 2014-12-12 DIAGNOSIS — N419 Inflammatory disease of prostate, unspecified: Secondary | ICD-10-CM | POA: Diagnosis present

## 2014-12-12 DIAGNOSIS — R609 Edema, unspecified: Secondary | ICD-10-CM | POA: Diagnosis present

## 2014-12-12 DIAGNOSIS — B9562 Methicillin resistant Staphylococcus aureus infection as the cause of diseases classified elsewhere: Secondary | ICD-10-CM | POA: Diagnosis present

## 2014-12-12 DIAGNOSIS — M25552 Pain in left hip: Secondary | ICD-10-CM

## 2014-12-12 DIAGNOSIS — I330 Acute and subacute infective endocarditis: Secondary | ICD-10-CM | POA: Diagnosis present

## 2014-12-12 DIAGNOSIS — M25452 Effusion, left hip: Secondary | ICD-10-CM | POA: Diagnosis present

## 2014-12-12 DIAGNOSIS — IMO0002 Reserved for concepts with insufficient information to code with codable children: Secondary | ICD-10-CM | POA: Diagnosis present

## 2014-12-12 DIAGNOSIS — Z8249 Family history of ischemic heart disease and other diseases of the circulatory system: Secondary | ICD-10-CM | POA: Diagnosis not present

## 2014-12-12 DIAGNOSIS — I2690 Septic pulmonary embolism without acute cor pulmonale: Secondary | ICD-10-CM | POA: Diagnosis present

## 2014-12-12 LAB — GLUCOSE, CAPILLARY
GLUCOSE-CAPILLARY: 253 mg/dL — AB (ref 65–99)
Glucose-Capillary: 195 mg/dL — ABNORMAL HIGH (ref 65–99)

## 2014-12-12 LAB — BASIC METABOLIC PANEL
Anion gap: 5 (ref 5–15)
BUN: 10 mg/dL (ref 6–20)
CO2: 27 mmol/L (ref 22–32)
Calcium: 8.4 mg/dL — ABNORMAL LOW (ref 8.9–10.3)
Chloride: 102 mmol/L (ref 101–111)
Creatinine, Ser: 0.96 mg/dL (ref 0.61–1.24)
GFR calc Af Amer: >60 mL/min (ref >60)
GFR calc non Af Amer: >60 mL/min (ref >60)
GLUCOSE: 264 mg/dL — AB (ref 65–99)
POTASSIUM: 4.1 mmol/L (ref 3.5–5.1)
SODIUM: 134 mmol/L — AB (ref 135–145)

## 2014-12-12 LAB — CBC
HCT: 27.3 % — ABNORMAL LOW (ref 39.0–52.0)
HEMOGLOBIN: 8.5 g/dL — AB (ref 13.0–17.0)
MCH: 25.2 pg — AB (ref 26.0–34.0)
MCHC: 31.1 g/dL (ref 30.0–36.0)
MCV: 81 fL (ref 78.0–100.0)
PLATELETS: 486 10*3/uL — AB (ref 150–400)
RBC: 3.37 MIL/uL — AB (ref 4.22–5.81)
RDW: 14.8 % (ref 11.5–15.5)
WBC: 12.2 10*3/uL — AB (ref 4.0–10.5)

## 2014-12-12 LAB — SEDIMENTATION RATE: Sed Rate: 87 mm/hr — ABNORMAL HIGH (ref 0–16)

## 2014-12-12 LAB — C-REACTIVE PROTEIN: CRP: 7.9 mg/dL — ABNORMAL HIGH (ref <1.0)

## 2014-12-12 MED ORDER — ACETAMINOPHEN 650 MG RE SUPP: 650.0000 mg | Freq: Four times a day (QID) | RECTAL | Status: DC | PRN

## 2014-12-12 MED ORDER — VANCOMYCIN HCL 10 G IV SOLR
1250.0000 mg | Freq: Two times a day (BID) | INTRAVENOUS | Status: DC
  Administered 2014-12-12 – 2014-12-18 (×12): 1250 mg via INTRAVENOUS
  Filled 2014-12-12 (×13): qty 1250

## 2014-12-12 MED ORDER — ADULT MULTIVITAMIN W/MINERALS CH
1.0000 | ORAL_TABLET | Freq: Every day | ORAL | Status: DC
  Administered 2014-12-12 – 2014-12-18 (×6): 1 via ORAL
  Filled 2014-12-12 (×7): qty 1

## 2014-12-12 MED ORDER — SODIUM CHLORIDE 0.9 % IJ SOLN
10.0000 mL | Freq: Two times a day (BID) | INTRAMUSCULAR | Status: DC
  Administered 2014-12-15: 10 mL
  Administered 2014-12-16: 20 mL

## 2014-12-12 MED ORDER — PANTOPRAZOLE SODIUM 40 MG PO TBEC
40.0000 mg | DELAYED_RELEASE_TABLET | Freq: Every day | ORAL | Status: DC
  Administered 2014-12-12 – 2014-12-18 (×6): 40 mg via ORAL
  Filled 2014-12-12 (×7): qty 1

## 2014-12-12 MED ORDER — INSULIN GLARGINE 100 UNIT/ML ~~LOC~~ SOLN
35.0000 [IU] | Freq: Every day | SUBCUTANEOUS | Status: DC
  Administered 2014-12-12 – 2014-12-15 (×4): 35 [IU] via SUBCUTANEOUS
  Filled 2014-12-12 (×5): qty 0.35

## 2014-12-12 MED ORDER — OXYCODONE HCL 5 MG PO TABS
5.0000 mg | ORAL_TABLET | ORAL | Status: DC | PRN
  Administered 2014-12-12 – 2014-12-13 (×3): 5 mg via ORAL
  Filled 2014-12-12 (×3): qty 1

## 2014-12-12 MED ORDER — ENOXAPARIN SODIUM 40 MG/0.4ML ~~LOC~~ SOLN
40.0000 mg | SUBCUTANEOUS | Status: DC
  Administered 2014-12-12: 40 mg via SUBCUTANEOUS
  Filled 2014-12-12: qty 0.4

## 2014-12-12 MED ORDER — VANCOMYCIN HCL IN DEXTROSE 1-5 GM/200ML-% IV SOLN
1000.0000 mg | Freq: Two times a day (BID) | INTRAVENOUS | Status: DC
  Administered 2014-12-12: 1000 mg via INTRAVENOUS
  Filled 2014-12-12 (×2): qty 200

## 2014-12-12 MED ORDER — ONDANSETRON HCL 4 MG PO TABS: 4.0000 mg | ORAL_TABLET | Freq: Four times a day (QID) | ORAL | Status: DC | PRN

## 2014-12-12 MED ORDER — FOLIC ACID 1 MG PO TABS
1.0000 mg | ORAL_TABLET | Freq: Every day | ORAL | Status: DC
  Administered 2014-12-12 – 2014-12-18 (×6): 1 mg via ORAL
  Filled 2014-12-12 (×7): qty 1

## 2014-12-12 MED ORDER — HYDROMORPHONE HCL 1 MG/ML IJ SOLN: 0.5000 mg | INTRAMUSCULAR | Status: DC | PRN

## 2014-12-12 MED ORDER — ALUM & MAG HYDROXIDE-SIMETH 200-200-20 MG/5ML PO SUSP
30.0000 mL | Freq: Four times a day (QID) | ORAL | Status: DC | PRN
  Administered 2014-12-15 – 2014-12-16 (×2): 30 mL via ORAL
  Filled 2014-12-12 (×2): qty 30

## 2014-12-12 MED ORDER — SODIUM CHLORIDE 0.9 % IJ SOLN
10.0000 mL | INTRAMUSCULAR | Status: DC | PRN
  Administered 2014-12-12 – 2014-12-18 (×6): 10 mL
  Filled 2014-12-12 (×6): qty 40

## 2014-12-12 MED ORDER — LISINOPRIL 5 MG PO TABS
5.0000 mg | ORAL_TABLET | Freq: Every day | ORAL | Status: DC
  Administered 2014-12-12 – 2014-12-18 (×6): 5 mg via ORAL
  Filled 2014-12-12 (×7): qty 1

## 2014-12-12 MED ORDER — ACETAMINOPHEN 325 MG PO TABS
650.0000 mg | ORAL_TABLET | Freq: Four times a day (QID) | ORAL | Status: DC | PRN
  Administered 2014-12-16: 650 mg via ORAL
  Filled 2014-12-12: qty 2

## 2014-12-12 MED ORDER — ONDANSETRON HCL 4 MG/2ML IJ SOLN: 4.0000 mg | Freq: Four times a day (QID) | INTRAMUSCULAR | Status: DC | PRN

## 2014-12-12 MED ORDER — SODIUM CHLORIDE 0.9 % IV SOLN
INTRAVENOUS | Status: DC
  Administered 2014-12-12 – 2014-12-13 (×2): via INTRAVENOUS

## 2014-12-12 MED ORDER — AMLODIPINE BESYLATE 10 MG PO TABS
10.0000 mg | ORAL_TABLET | Freq: Every day | ORAL | Status: DC
  Administered 2014-12-12 – 2014-12-18 (×6): 10 mg via ORAL
  Filled 2014-12-12 (×7): qty 1

## 2014-12-12 MED ORDER — GLIMEPIRIDE 2 MG PO TABS
2.0000 mg | ORAL_TABLET | Freq: Every day | ORAL | Status: DC
  Administered 2014-12-12 – 2014-12-15 (×3): 2 mg via ORAL
  Filled 2014-12-12 (×6): qty 1

## 2014-12-12 NOTE — Consult Note (Addendum)
ORTHOPAEDIC CONSULTATION  REQUESTING PHYSICIAN: Ripley Fraise, MD  Chief Complaint: Left hip pain  HPI: Marc [name] is a 54 y.o. male who complains of  increasing left hip pain since Wednesday. He localizes the pain over the lateral aspect of the proximal thigh and hip, he actually denies groin pain, but says that he gets significant relief with hip flexion and external rotation. He presented to the emergency room with gram-positive cocci in his blood earlier this month, in early July. He was noncompliant with the antibiotics regimen.  He has been given a presumptive diagnosis of septic emboli, and endocarditis, although extensive cardiac workup apparently has not demonstrated vegetations.  His left hip is worse with movement, he has had difficulty bearing weight, and he hadn't interventional radiology aspiration of his left hip today which did not demonstrate any fluid. There was a small amount of fluid on MRI imaging, and this was concerning for possible septic hip. He has never had surgery on this hip. He denies fevers and chills. One I ask him about the presence of "septic emboli" he says he doesn't know anything about this. He is not sure what this means and does not think he ever had a problem like this.  The septic emboli diagnosis apparently was found on CT of the chest with cavitations and evidence for septic emboli.  He denies ever having hip pain prior to this Wednesday.   Past Medical History  Diagnosis Date  . Diabetes mellitus without complication   . Hypertension   . Endocarditis    Past Surgical History  Procedure Laterality Date  . No past surgeries    . Video bronchoscopy Bilateral 12/03/2014    Procedure: VIDEO BRONCHOSCOPY WITHOUT FLUORO;  Surgeon: Chesley Mires, MD;  Location: [facility];  Service: Cardiopulmonary;  Laterality: Bilateral;   History   Social History  . Marital Status: Single    Spouse Name: N/A  . Number of Children: N/A  . Years of  Education: N/A   Social History Main Topics  . Smoking status: Never Smoker   . Smokeless tobacco: Never Used  . Alcohol Use: No     Comment: patient states he does not drink on 12/08/14  . Drug Use: No  . Sexual Activity: Not on file   Other Topics Concern  . None   Social History Narrative   Family History  Problem Relation Age of Onset  . Hypertension Mother   . Diabetes Sister   . Diabetes Brother    No Known Allergies Prior to Admission medications   Medication Sig Start Date End Date Taking? Authorizing Provider  alum & mag hydroxide-simeth (MAALOX/MYLANTA) 200-200-20 MG/5ML suspension Take 30 mLs by mouth every 6 (six) hours as needed for indigestion or heartburn. 11/22/14  Yes Robbie Lis, MD  amLODipine (NORVASC) 10 MG tablet Take 1 tablet (10 mg total) by mouth daily. 12/08/14  Yes Arnoldo Morale, MD  Blood Glucose Monitoring Suppl (TRUE METRIX METER) W/DEVICE KIT 1 each by Does not apply route 4 (four) times daily - after meals and at bedtime. 12/08/14  Yes Arnoldo Morale, MD  folic acid (FOLVITE) 1 MG tablet Take 1 tablet (1 mg total) by mouth daily. 11/22/14  Yes Robbie Lis, MD  glimepiride (AMARYL) 2 MG tablet Take 1 tablet (2 mg total) by mouth daily with breakfast. 12/04/14  Yes Maryann Mikhail, DO  glucose blood test strip Use as instructed 12/07/14  Yes Arnoldo Morale, MD  glucose blood test strip 1 each  by Other route 4 (four) times daily -  before meals and at bedtime. Use as instructed 12/08/14  Yes Arnoldo Morale, MD  insulin aspart (NOVOLOG) 100 UNIT/ML injection Inject 7 Units into the skin 3 (three) times daily with meals. 11/22/14  Yes Robbie Lis, MD  insulin glargine (LANTUS) 100 UNIT/ML injection Inject 0.35 mLs (35 Units total) into the skin at bedtime. 11/22/14  Yes Robbie Lis, MD  linezolid (ZYVOX) 600 MG tablet Take 1 tablet (600 mg total) by mouth 2 (two) times daily. 12/11/14  Yes Arnoldo Morale, MD  Multiple Vitamin (MULTIVITAMIN WITH MINERALS) TABS tablet Take  1 tablet by mouth daily. 11/22/14  Yes Robbie Lis, MD  pantoprazole (PROTONIX) 40 MG tablet Take 1 tablet (40 mg total) by mouth daily. 11/22/14  Yes Robbie Lis, MD  glucose blood test strip Use as instructed Patient not taking: Reported on 12/11/2014 11/22/14   Robbie Lis, MD  lisinopril (PRINIVIL,ZESTRIL) 5 MG tablet Take 1 tablet (5 mg total) by mouth daily. Patient not taking: Reported on 12/11/2014 12/08/14   Arnoldo Morale, MD  tamsulosin (FLOMAX) 0.4 MG CAPS capsule Take 1 capsule (0.4 mg total) by mouth daily after supper. Patient not taking: Reported on 12/11/2014 11/22/14   Robbie Lis, MD  thiamine 100 MG tablet Take 1 tablet (100 mg total) by mouth daily. Patient not taking: Reported on 12/08/2014 11/22/14   Robbie Lis, MD  TRUEPLUS LANCETS 30G MISC 1 each by Does not apply route 4 (four) times daily - after meals and at bedtime. Patient not taking: Reported on 12/11/2014 12/08/14   Arnoldo Morale, MD  vancomycin (VANCOCIN) 1 GM/200ML SOLN Inject 300 mLs (1,500 mg total) into the vein every 12 (twelve) hours. Per home health protocol Patient not taking: Reported on 12/08/2014 12/04/14   Cristal Ford, DO   Mr Hip Left W Wo Contrast  12/11/2014   CLINICAL DATA:  Severe left hip pain for 3 days. Rule out septic arthritis. History of endocarditis.  EXAM: MRI OF THE LEFT HIP WITHOUT AND WITH CONTRAST  TECHNIQUE: Multiplanar, multisequence MR imaging was performed both before and after administration of intravenous contrast.  CONTRAST:  60m MULTIHANCE GADOBENATE DIMEGLUMINE 529 MG/ML IV SOLN  COMPARISON:  None.  FINDINGS: Bones: No acute fracture or dislocation. Severe marrow edema in the left femoral head with a subchondral cystic area.  No other marrow signal abnormality. Normal sacrum and sacroiliac joints.  Articular cartilage and labrum  Articular cartilage: Partial-thickness cartilage loss of bilateral hips, left greater than right.  Labrum:  No gross labral or paralabral abnormality.  Joint  or bursal effusion  Joint effusion: Moderate left hip joint effusion without significant synovial enhancement. There is mild surrounding soft tissue enhancement.  Bursae:  No bursa formation.  Muscles and tendons  Muscles and tendons: There is edema within the obturator externus muscle bilaterally, left greater than right, which may reflect reactive edema versus muscle strain.  Other findings  Miscellaneous: Bladder wall thickening with a Foley catheter present within the bladder. There is severe surrounding edema around the bladder with mild enhancement. There is diffuse avid enhancement of the prostate gland with multiple nonenhancing areas within the prostate gland with the largest along the left mid gland measuring 2.2 x 1.4 cm which may reflect prostatitis with an abscess.  IMPRESSION: 1. Moderate left hip joint effusion with severe edema in the left femoral head and mild surrounding soft tissue edema with enhancement. The appearance is most concerning  for septic arthritis until proven otherwise. Early AVN is also in the differential diagnosis, but currently considered less likely given the overall patient history. If there is further clinical concern, recommend arthrocentesis. 2. Severe bladder wall thickening with a Foley catheter present and surrounding edema with enhancement concerning for cystitis. 3. Diffuse avid enhancement of the prostate gland with multiple nonenhancing areas within the prostate gland with the largest along the left mid gland measuring 2.2 x 1.4 cm which may reflect prostatitis with an abscess. 4. Edema within the obturator externus muscle bilaterally, left greater than right, likely reflecting muscle strain versus reactive edema given the lack of enhancement.   Electronically Signed   By: Kathreen Devoid   On: 12/11/2014 19:00   Dg Hip Unilat With Pelvis 2-3 Views Left  12/11/2014   CLINICAL DATA:  Lateral left hip pain for 2-3 days. No known injury. Initial encounter.  EXAM: DG HIP  (WITH OR WITHOUT PELVIS) 2-3V LEFT  COMPARISON:  None.  FINDINGS: There is no acute bony or joint abnormality. No evidence of avascular necrosis of the femoral heads is identified. No notable degenerative change is seen about the hips, symphysis pubis or sacroiliac joints. Imaged soft tissue structures are unremarkable.  IMPRESSION: Negative exam.   Electronically Signed   By: Inge Rise M.D.   On: 12/11/2014 15:05    Positive ROS: All other systems have been reviewed and were otherwise negative with the exception of those mentioned in the HPI and as above.  Physical Exam: General: Alert, no acute distress Cardiovascular: No pedal edema Respiratory: No cyanosis, no use of accessory musculature GI: No organomegaly, abdomen is soft and non-tender Skin: No lesions in the area of chief complaint Neurologic: Sensation intact distally Psychiatric: Patient is competent for consent with normal mood and affect Lymphatic: No axillary or cervical lymphadenopathy  MUSCULOSKELETAL: Left hip is held in a flexed and externally rotated position. Range of motion is from 0-90, he has 20 of external rotation and about 10 of internal rotation. This does cause significant pain. The pain however is not located in his groin,   The pain is located directly over the posterolateral buttocks, and the lateral side. During his exam he has a strange spasm in the muscles in his leg as we are moving his hip.  EHL and FHL is intact, I do not appreciate any significant soft tissue swelling or warmth around the left hip.  Assessment: 54 year old male with diabetes and cavitary emboli on lung scan consistent with septic emboli, with MRSA gram-positive bacteremia diagnosed earlier this month incompletely treated/noncompliant with antibiotics, with new onset left hip pain, question septic joint.  Plan:  This is a difficult and challenging problem both diagnosis as well as to treat. I am not clear that he needs surgery on  his hip at this point, but I am concerned that a surgical washout may be indicated.  We have ordered aspiration under interventional radiology, although there was not much fluid within the hip joint itself. This argues against a septic joint, however the appearance on MRI was concerning, and his exam and story is certainly very concerning.  I would recommend admission to the medical service with PICC line placement in order to ensure compliance and appropriate treatment of his bacteremia and sepsis and septic emboli, and I will plan to talk with subspecialty care at [facility] tomorrow with their orthopedics to determine if they are willing to evaluate the hip to determine if hip arthroscopy or possibly open hip  irrigation and debridement is indicated. This is an extremely complicated situation, that has long-term ramifications both from morbidity and mortality standpoint.  Spoke with Jana Hakim with [facility] and she is agreeable to the admission and this plan.  Johnny Bridge, MD Cell [phone]   12/12/2014 1:35 AM

## 2014-12-12 NOTE — Progress Notes (Signed)
ANTIBIOTIC CONSULT NOTE - INITIAL  Pharmacy Consult for Vancomycin Indication: septic hip?  No Known Allergies  Patient Measurements:   Adjusted Body Weight:   Vital Signs: Temp: 98.4 F (36.9 C) (07/23 0310) Temp Source: Oral (07/23 0310) BP: 147/89 mmHg (07/23 0310) Pulse Rate: 82 (07/23 0310) Intake/Output from previous day: 07/22 0701 - 07/23 0700 In: 500 [IV Piggyback:500] Out: 600 [Urine:600] Intake/Output from this shift:    Labs:  Recent Labs  12/09/14 0500 12/11/14 1520  WBC 9.9 13.6*  HGB 8.3* 8.6*  PLT 459* 480*  CREATININE 0.86 1.08   Estimated Creatinine Clearance: 80 mL/min (by C-G formula based on Cr of 1.08).  Recent Labs  12/10/14 1145  VANCOTROUGH 20     Microbiology: Recent Results (from the past 720 hour(s))  Urine culture     Status: None   Collection Time: 11/16/14 12:11 PM  Result Value Ref Range Status   Specimen Description URINE, CLEAN CATCH  Final   Special Requests NONE  Final   Culture   Final    MULTIPLE SPECIES PRESENT, SUGGEST RECOLLECTION IF CLINICALLY INDICATED Performed at [facility]    Report Status 11/18/2014 FINAL  Final  Culture, blood (routine x 2) Call MD if unable to obtain prior to antibiotics being given     Status: None   Collection Time: 11/21/14 10:41 AM  Result Value Ref Range Status   Specimen Description BLOOD ARM LEFT  Final   Special Requests   Final    BOTTLES DRAWN AEROBIC AND ANAEROBIC 10CC BOTH BOTTLES   Culture  Setup Time   Final    GRAM POSITIVE COCCI IN CLUSTERS Gram Stain Report Called to,Read Back By and Verified With: M. Long RN 17:50 11/24/14 ([email or web address]) ANAEROBIC BOTTLE ONLY    Culture   Final    METHICILLIN RESISTANT STAPHYLOCOCCUS AUREUS Performed at [facility]    Report Status 11/27/2014 FINAL  Final   Organism ID, Bacteria METHICILLIN RESISTANT STAPHYLOCOCCUS AUREUS  Final      Susceptibility   Methicillin resistant staphylococcus aureus - MIC*    CIPROFLOXACIN  >=8 RESISTANT Resistant     ERYTHROMYCIN >=8 RESISTANT Resistant     GENTAMICIN <=0.5 SENSITIVE Sensitive     OXACILLIN >=4 RESISTANT Resistant     TETRACYCLINE <=1 SENSITIVE Sensitive     VANCOMYCIN 1 SENSITIVE Sensitive     TRIMETH/SULFA <=10 SENSITIVE Sensitive     CLINDAMYCIN <=0.25 SENSITIVE Sensitive     RIFAMPIN <=0.5 SENSITIVE Sensitive     Inducible Clindamycin NEGATIVE Sensitive     * METHICILLIN RESISTANT STAPHYLOCOCCUS AUREUS  Culture, blood (routine x 2) Call MD if unable to obtain prior to antibiotics being given     Status: None   Collection Time: 11/21/14 10:49 AM  Result Value Ref Range Status   Specimen Description BLOOD RIGHT ARM  Final   Special Requests   Final    BOTTLES DRAWN AEROBIC AND ANAEROBIC 10CC BOTH BOTTLES   Culture  Setup Time   Final    GRAM POSITIVE COCCI IN CLUSTERS ANAEROBIC BOTTLE ONLY CRITICAL RESULT CALLED TO, READ BACK BY AND VERIFIED WITH: A DEVINE,MD AT 1610 11/24/14 BY L BENFIELD    Culture   Final    METHICILLIN RESISTANT STAPHYLOCOCCUS AUREUS SUSCEPTIBILITIES PERFORMED ON PREVIOUS CULTURE WITHIN THE LAST 5 DAYS. Performed at [facility]    Report Status 11/27/2014 FINAL  Final  Blood culture (routine x 2)     Status: None   Collection Time:  11/30/14  8:56 PM  Result Value Ref Range Status   Specimen Description BLOOD RIGHT HAND  Final   Special Requests BOTTLES DRAWN AEROBIC AND ANAEROBIC 10CC EA  Final   Culture   Final    NO GROWTH 5 DAYS Performed at [facility]    Report Status 12/05/2014 FINAL  Final  Blood culture (routine x 2)     Status: None   Collection Time: 11/30/14  9:19 PM  Result Value Ref Range Status   Specimen Description BLOOD LEFT ANTECUBITAL  Final   Special Requests BOTTLES DRAWN AEROBIC AND ANAEROBIC 5CC EA  Final   Culture   Final    NO GROWTH 5 DAYS Performed at [facility]    Report Status 12/05/2014 FINAL  Final  Urine culture     Status: None   Collection Time: 11/30/14   9:57 PM  Result Value Ref Range Status   Specimen Description URINE, CATHETERIZED  Final   Special Requests NONE  Final   Culture   Final    NO GROWTH 2 DAYS Performed at [facility]    Report Status 12/02/2014 FINAL  Final  Culture, bal-quantitative     Status: None   Collection Time: 12/03/14  9:29 AM  Result Value Ref Range Status   Specimen Description BRONCHIAL ALVEOLAR LAVAGE  Final   Special Requests NONE  Final   Gram Stain   Final    RARE WBC PRESENT, PREDOMINANTLY MONONUCLEAR RARE SQUAMOUS EPITHELIAL CELLS PRESENT FEW GRAM POSITIVE COCCI IN PAIRS IN CLUSTERS RARE GRAM NEGATIVE RODS Performed at [facility]   Final    75,000 COLONIES/ML Performed at [facility]    Culture   Final    Non-Pathogenic Oropharyngeal-type Flora Isolated. Performed at [facility]    Report Status 12/06/2014 FINAL  Final  AFB culture with smear     Status: None (Preliminary result)   Collection Time: 12/03/14  9:29 AM  Result Value Ref Range Status   Specimen Description BRONCHIAL ALVEOLAR LAVAGE  Final   Special Requests NONE  Final   Acid Fast Smear   Final    NO ACID FAST BACILLI SEEN Performed at [facility]    Culture   Final    CULTURE WILL BE EXAMINED FOR 6 WEEKS BEFORE ISSUING A FINAL REPORT Performed at [facility]    Report Status PENDING  Incomplete  Fungus Culture with Smear     Status: None (Preliminary result)   Collection Time: 12/03/14  9:29 AM  Result Value Ref Range Status   Specimen Description BRONCHIAL ALVEOLAR LAVAGE  Final   Special Requests NONE  Final   Fungal Smear   Final    NO YEAST OR FUNGAL ELEMENTS SEEN Performed at [facility]    Culture   Final    CANDIDA ALBICANS Performed at [facility]    Report Status PENDING  Incomplete  Blood culture (routine x 2)     Status: None (Preliminary result)   Collection Time: 12/08/14  6:09 PM  Result Value Ref Range  Status   Specimen Description BLOOD LEFT HAND  Final   Special Requests BOTTLES DRAWN AEROBIC AND ANAEROBIC 5CC EACH  Final   Culture   Final    NO GROWTH 3 DAYS Performed at [facility]    Report Status PENDING  Incomplete  Blood culture (routine x 2)     Status: None (Preliminary  result)   Collection Time: 12/08/14  6:30 PM  Result Value Ref Range Status   Specimen Description BLOOD L HAND  Final   Special Requests BOTTLES DRAWN AEROBIC AND ANAEROBIC 5CC  Final   Culture   Final    NO GROWTH 3 DAYS Performed at [facility]    Report Status PENDING  Incomplete    Medical History: Past Medical History  Diagnosis Date  . Diabetes mellitus without complication   . Hypertension   . Endocarditis     Medications:  Anti-infectives    Start     Dose/Rate Route Frequency Ordered Stop   12/11/14 1530  vancomycin (VANCOCIN) 1,500 mg in sodium chloride 0.9 % 500 mL IVPB     1,500 mg 250 mL/hr over 120 Minutes Intravenous  Once 12/11/14 1500 12/11/14 1809     Assessment: 54 y.o. male who complains of  increasing left hip pain since Wednesday.  presented to the emergency room with gram-positive cocci in his blood earlier this month, in early July. He was noncompliant with the antibiotics regimen. presumptive diagnosis of septic emboli, and endocarditis, although extensive cardiac workup apparently has not demonstrated vegetations.  Possible transfer to [facility].  First dose of antibiotics already given in ED.   Goal of Therapy:  Vancomycin trough level 15-20 mcg/ml  Plan:  Measure antibiotic drug levels at steady state Follow up culture results Vancomycin 1gm iv q12hr  Aleene Davidson Crowford 12/12/2014,3:14 AM

## 2014-12-12 NOTE — ED Notes (Signed)
Pt given sandwich and diet sprite

## 2014-12-12 NOTE — Progress Notes (Signed)
Peripherally Inserted Central Catheter/Midline Placement  The IV Nurse has discussed with the patient and/or persons authorized to consent for the patient, the purpose of this procedure and the potential benefits and risks involved with this procedure.  The benefits include less needle sticks, lab draws from the catheter and patient may be discharged home with the catheter.  Risks include, but not limited to, infection, bleeding, blood clot (thrombus formation), and puncture of an artery; nerve damage and irregular heat beat.  Alternatives to this procedure were also discussed.  PICC/Midline Placement Documentation  PICC / Midline Double Lumen 12/12/14 PICC Right Basilic 40 cm 0 cm (Active)  Indication for Insertion or Continuance of Line Home intravenous therapies (PICC only) 12/12/2014  8:49 AM  Exposed Catheter (cm) 0 cm 12/12/2014  8:49 AM  Site Assessment Clean;Dry;Intact 12/12/2014  8:49 AM  Lumen #1 Status Flushed;Saline locked;Blood return noted 12/12/2014  8:49 AM  Lumen #2 Status Flushed;Saline locked;Blood return noted 12/12/2014  8:49 AM  Dressing Type Transparent 12/12/2014  8:49 AM  Dressing Status Clean;Dry;Intact 12/12/2014  8:49 AM  Dressing Change Due 12/19/14 12/12/2014  8:49 AM       Ethelda Chick 12/12/2014, 8:50 AM

## 2014-12-12 NOTE — ED Provider Notes (Signed)
D/w dr Dion Saucier He feels pt should be admitted to [facility] and potential txfer to [facility] tomorrow after PICC placement He will call and d/w [facility]   Zadie Rhine, MD 12/12/14 [phone]

## 2014-12-12 NOTE — Progress Notes (Signed)
54 year old gentleman with h/o uncontrolled DM, non compliance and MRSA bacteremia, supposed to be on 6 weeks of antibiotics was discharged to rehab but  left AMA from rehab center with foley catheter, presents to ED with worsening left hip pain.  MRI hip shows worsening of the left hip with septic arthritis. Orthopedics consutled and plan to take him to OR in am for i&d.  Resume IV antibiotics and pain control.  Please see Dr Lovell Sheehan note for detailed H&p.   Kathlen Mody, MD [phone]

## 2014-12-12 NOTE — Progress Notes (Signed)
     Subjective:  Patient reports pain as moderate.  Still unable to walk.  Denies fevers and chills.    Objective:   VITALS:   Filed Vitals:   12/12/14 0310 12/12/14 0334 12/12/14 0400 12/12/14 1350  BP: 147/89 125/73  132/65  Pulse: 82 86  98  Temp: 98.4 F (36.9 C) 98.4 F (36.9 C)  99.4 F (37.4 C)  TempSrc: Oral Oral  Oral  Resp: Height:    (1.753 m)   Weight:   77.111 kg (170 lb)   SpO2: 97% 97%  97%    Left hip still has very limited active and passive motion, unable to do SLR, hip motion 0-70 deg passively with significant pain, less IR/ER than yesterday.  Minimal warmth, may be some laterally.     Lab Results  Component Value Date   WBC 12.2* 12/12/2014   HGB 8.5* 12/12/2014   HCT 27.3* 12/12/2014   MCV 81.0 12/12/2014   PLT 486* 12/12/2014   BMET    Component Value Date/Time   NA 134* 12/12/2014 0525   K 4.1 12/12/2014 0525   CL 102 12/12/2014 0525   CO2 27 12/12/2014 0525   GLUCOSE 264* 12/12/2014 0525   BUN 10 12/12/2014 0525   CREATININE 0.96 12/12/2014 0525   CALCIUM 8.4* 12/12/2014 0525   GFRNONAA >60 12/12/2014 0525   GFRAA >60 12/12/2014 0525     Assessment/Plan:     Principal Problem:   Endocarditis Active Problems:   Essential hypertension   Non-compliance with treatment   Septic arthritis   Uncontrolled diabetes mellitus   Anemia   Hip pain   I am concerned that his hip is not responding, and may be getting worse. I have recommended surgical intervention, and will will plan for open I&D tomorrow.  The RBA discussed, including potential for osteomyelitis, the potential need for revision surgery, incomplete relief of pain, infection, bleeding, nerve injury, among others.  He is willing to proceed.    Cassie Henkels P 12/12/2014, 2:21 PM   Teryl Lucy, MD Cell [phone]

## 2014-12-12 NOTE — Progress Notes (Signed)
ANTIBIOTIC CONSULT NOTE - Follow up  Pharmacy Consult for Vancomycin Indication: septic hip?  No Known Allergies  Patient Measurements: Height: 5\' 9"  (175.3 cm) Weight: 170 lb (77.111 kg) IBW/kg (Calculated) : 70.7 Adjusted Body Weight:   Vital Signs: Temp: 98.4 F (36.9 C) (07/23 0334) Temp Source: Oral (07/23 0334) BP: 125/73 mmHg (07/23 0334) Pulse Rate: 86 (07/23 0334) Intake/Output from previous day: 07/22 0701 - 07/23 0700 In: 500 [IV Piggyback:500] Out: 1900 [Urine:1900] Intake/Output from this shift: Total I/O In: -  Out: 450 [Urine:450]  Labs:  Recent Labs  12/11/14 1520 12/12/14 0525  WBC 13.6* 12.2*  HGB 8.6* 8.5*  PLT 480* 486*  CREATININE 1.08 0.96   Estimated Creatinine Clearance: 90 mL/min (by C-G formula based on Cr of 0.96).  Recent Labs  12/10/14 1145  VANCOTROUGH 20     Microbiology: Recent Results (from the past 720 hour(s))  Urine culture     Status: None   Collection Time: 11/16/14 12:11 PM  Result Value Ref Range Status   Specimen Description URINE, CLEAN CATCH  Final   Special Requests NONE  Final   Culture   Final    MULTIPLE SPECIES PRESENT, SUGGEST RECOLLECTION IF CLINICALLY INDICATED Performed at [facility]    Report Status 11/18/2014 FINAL  Final  Culture, blood (routine x 2) Call MD if unable to obtain prior to antibiotics being given     Status: None   Collection Time: 11/21/14 10:41 AM  Result Value Ref Range Status   Specimen Description BLOOD ARM LEFT  Final   Special Requests   Final    BOTTLES DRAWN AEROBIC AND ANAEROBIC 10CC BOTH BOTTLES   Culture  Setup Time   Final    GRAM POSITIVE COCCI IN CLUSTERS Gram Stain Report Called to,Read Back By and Verified With: M. Long RN 17:50 11/24/14 ([email or web address]) ANAEROBIC BOTTLE ONLY    Culture   Final    METHICILLIN RESISTANT STAPHYLOCOCCUS AUREUS Performed at [facility]    Report Status 11/27/2014 FINAL  Final   Organism ID, Bacteria METHICILLIN RESISTANT  STAPHYLOCOCCUS AUREUS  Final      Susceptibility   Methicillin resistant staphylococcus aureus - MIC*    CIPROFLOXACIN >=8 RESISTANT Resistant     ERYTHROMYCIN >=8 RESISTANT Resistant     GENTAMICIN <=0.5 SENSITIVE Sensitive     OXACILLIN >=4 RESISTANT Resistant     TETRACYCLINE <=1 SENSITIVE Sensitive     VANCOMYCIN 1 SENSITIVE Sensitive     TRIMETH/SULFA <=10 SENSITIVE Sensitive     CLINDAMYCIN <=0.25 SENSITIVE Sensitive     RIFAMPIN <=0.5 SENSITIVE Sensitive     Inducible Clindamycin NEGATIVE Sensitive     * METHICILLIN RESISTANT STAPHYLOCOCCUS AUREUS  Culture, blood (routine x 2) Call MD if unable to obtain prior to antibiotics being given     Status: None   Collection Time: 11/21/14 10:49 AM  Result Value Ref Range Status   Specimen Description BLOOD RIGHT ARM  Final   Special Requests   Final    BOTTLES DRAWN AEROBIC AND ANAEROBIC 10CC BOTH BOTTLES   Culture  Setup Time   Final    GRAM POSITIVE COCCI IN CLUSTERS ANAEROBIC BOTTLE ONLY CRITICAL RESULT CALLED TO, READ BACK BY AND VERIFIED WITH: A DEVINE,MD AT [id number] 11/24/14 BY L BENFIELD    Culture   Final    METHICILLIN RESISTANT STAPHYLOCOCCUS AUREUS SUSCEPTIBILITIES PERFORMED ON PREVIOUS CULTURE WITHIN THE LAST 5 DAYS. Performed at [facility]    Report Status 11/27/2014  FINAL  Final  Blood culture (routine x 2)     Status: None   Collection Time: 11/30/14  8:56 PM  Result Value Ref Range Status   Specimen Description BLOOD RIGHT HAND  Final   Special Requests BOTTLES DRAWN AEROBIC AND ANAEROBIC 10CC EA  Final   Culture   Final    NO GROWTH 5 DAYS Performed at [facility]    Report Status 12/05/2014 FINAL  Final  Blood culture (routine x 2)     Status: None   Collection Time: 11/30/14  9:19 PM  Result Value Ref Range Status   Specimen Description BLOOD LEFT ANTECUBITAL  Final   Special Requests BOTTLES DRAWN AEROBIC AND ANAEROBIC 5CC EA  Final   Culture   Final    NO GROWTH 5 DAYS Performed at [facility]    Report Status 12/05/2014 FINAL  Final  Urine culture     Status: None   Collection Time: 11/30/14  9:57 PM  Result Value Ref Range Status   Specimen Description URINE, CATHETERIZED  Final   Special Requests NONE  Final   Culture   Final    NO GROWTH 2 DAYS Performed at [facility]    Report Status 12/02/2014 FINAL  Final  Culture, bal-quantitative     Status: None   Collection Time: 12/03/14  9:29 AM  Result Value Ref Range Status   Specimen Description BRONCHIAL ALVEOLAR LAVAGE  Final   Special Requests NONE  Final   Gram Stain   Final    RARE WBC PRESENT, PREDOMINANTLY MONONUCLEAR RARE SQUAMOUS EPITHELIAL CELLS PRESENT FEW GRAM POSITIVE COCCI IN PAIRS IN CLUSTERS RARE GRAM NEGATIVE RODS Performed at [facility]   Final    75,000 COLONIES/ML Performed at [facility]    Culture   Final    Non-Pathogenic Oropharyngeal-type Flora Isolated. Performed at [facility]    Report Status 12/06/2014 FINAL  Final  AFB culture with smear     Status: None (Preliminary result)   Collection Time: 12/03/14  9:29 AM  Result Value Ref Range Status   Specimen Description BRONCHIAL ALVEOLAR LAVAGE  Final   Special Requests NONE  Final   Acid Fast Smear   Final    NO ACID FAST BACILLI SEEN Performed at [facility]    Culture   Final    CULTURE WILL BE EXAMINED FOR 6 WEEKS BEFORE ISSUING A FINAL REPORT Performed at [facility]    Report Status PENDING  Incomplete  Fungus Culture with Smear     Status: None (Preliminary result)   Collection Time: 12/03/14  9:29 AM  Result Value Ref Range Status   Specimen Description BRONCHIAL ALVEOLAR LAVAGE  Final   Special Requests NONE  Final   Fungal Smear   Final    NO YEAST OR FUNGAL ELEMENTS SEEN Performed at [facility]    Culture   Final    CANDIDA ALBICANS Performed at [facility]    Report Status PENDING  Incomplete  Blood culture  (routine x 2)     Status: None (Preliminary result)   Collection Time: 12/08/14  6:09 PM  Result Value Ref Range Status   Specimen Description BLOOD LEFT HAND  Final   Special Requests BOTTLES DRAWN AEROBIC AND ANAEROBIC 5CC EACH  Final   Culture   Final    NO GROWTH 4 DAYS Performed at [facility]  Report Status PENDING  Incomplete  Blood culture (routine x 2)     Status: None (Preliminary result)   Collection Time: 12/08/14  6:30 PM  Result Value Ref Range Status   Specimen Description BLOOD L HAND  Final   Special Requests BOTTLES DRAWN AEROBIC AND ANAEROBIC 5CC  Final   Culture   Final    NO GROWTH 4 DAYS Performed at [facility]    Report Status PENDING  Incomplete  Blood culture (routine x 2)     Status: None (Preliminary result)   Collection Time: 12/11/14  3:15 PM  Result Value Ref Range Status   Specimen Description BLOOD RIGHT ANTECUBITAL  Final   Special Requests BOTTLES DRAWN AEROBIC AND ANAEROBIC 5CC  Final   Culture   Final    NO GROWTH < 24 HOURS Performed at [facility]    Report Status PENDING  Incomplete  Blood culture (routine x 2)     Status: None (Preliminary result)   Collection Time: 12/11/14  3:22 PM  Result Value Ref Range Status   Specimen Description BLOOD RIGHT HAND  Final   Special Requests BOTTLES DRAWN AEROBIC AND ANAEROBIC 4CC  Final   Culture   Final    NO GROWTH < 24 HOURS Performed at [facility]    Report Status PENDING  Incomplete  Body fluid culture     Status: None (Preliminary result)   Collection Time: 12/11/14 10:51 PM  Result Value Ref Range Status   Specimen Description SYNOVIAL  Final   Special Requests NONE  Final   Gram Stain   Final    WBC PRESENT, PREDOMINANTLY PMN NO ORGANISMS SEEN Performed at [facility]    Culture PENDING  Incomplete   Report Status PENDING  Incomplete    Medical History: Past Medical History  Diagnosis Date  . Diabetes mellitus without complication    . Hypertension   . Endocarditis     Medications:  Anti-infectives    Start     Dose/Rate Route Frequency Ordered Stop   12/12/14 0600  vancomycin (VANCOCIN) IVPB 1000 mg/200 mL premix     1,000 mg 200 mL/hr over 60 Minutes Intravenous Every 12 hours 12/12/14 0315     12/11/14 1530  vancomycin (VANCOCIN) 1,500 mg in sodium chloride 0.9 % 500 mL IVPB     1,500 mg 250 mL/hr over 120 Minutes Intravenous  Once 12/11/14 1500 12/11/14 1809     Assessment: 54 y.o. male recently admitted 7/19-7/21, complains of increasing left hip pain. 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 The plan was for placement at a rehab facility to continue IV antibiotics but he left AMA on 7/21. Pharmacy is asked to resume Vancomycin dosing.  7/19 >> Vanc >> 7/21, 7/23 >>  7/2 blood x2: 2 of 2 MRSA 7/11 blood x2: NGF 7/19 blood x2: ngtd 7/22 blood x2: ngtd   7/21: Vanc trough = 20 on  q12h, in target range but drawn before reaching steady-state so it will likely accumulate.  Today, 12/12/2014: Afebrile, WBCs mildly elevated, SCr wnl, CrCl 90CG  Goal of Therapy:  Vancomycin trough level 15-20 mcg/ml  Appropriate antibiotic dosing for renal function; eradication of infection  Plan:  Based on recent trough, increase Vanc to  IV q12h. Measure Vanc trough at steady state. Follow up renal fxn, culture results, and clinical course.  Charolotte Eke, PharmD, pager  [phone]. 12/12/2014,1:55 PM.

## 2014-12-12 NOTE — H&P (Addendum)
[facility] Admission History and Physical       Marc [name] JQZ:[id number] DOB: 05-Jan-1961 DOA: 12/11/2014  Referring physician: EDP PCP: No PCP Per Patient  Specialists:   Chief Complaint: Left Hip Pain  HPI: Marc [name] is a 54 y.o. male with a history of Uncontrolled DM2, and Medical Noncompliance who had been found to have  Presumed Septic Emboli and an MRSA bacteremia and was to have a 6 week course of antibiotic rx and Rehab Ctr placement but left AMA on 12/10/2014, and returns to the ED with complaints of increased left hip pain and difficulty walking due to the pain x 3 days.   An X-ray of the Left Hip and an MRI were performed and  The MRI revealed findings consistent with a Septic Arthritis.   An I+D was performed in the ED, and he was placed on IV Vancomycin and Orhtopedics was consulted Dr. Mardelle Matte.   He was referred for admission and plans per Orthopedics are to have PICC line placement, and to contact [place] [facility] to confer for possible transfer.      Review of Systems:  Constitutional: No Weight Loss, No Weight Gain, Night Sweats, Fevers, Chills, Dizziness, Light Headedness, Fatigue, or Generalized Weakness HEENT: No Headaches, Difficulty Swallowing,Tooth/Dental Problems,Sore Throat,  No Sneezing, Rhinitis, Ear Ache, Nasal Congestion, or Post Nasal Drip,  Cardio-vascular:  No Chest pain, Orthopnea, PND, Edema in Lower Extremities, Anasarca, Dizziness, Palpitations  Resp: No Dyspnea, No DOE, No Productive Cough, No Non-Productive Cough, No Hemoptysis, No Wheezing.    GI: No Heartburn, Indigestion, Abdominal Pain, Nausea, Vomiting, Diarrhea, Constipation, Hematemesis, Hematochezia, Melena, Change in Bowel Habits,  Loss of Appetite  GU: No Dysuria, No Change in Color of Urine, No Urgency or Urinary Frequency, No Flank pain.  Musculoskeletal: No Joint Pain or Swelling, No Decreased Range of Motion, No Back Pain.  Neurologic: No Syncope, No  Seizures, Muscle Weakness, Paresthesia, Vision Disturbance or Loss, No Diplopia, No Vertigo, No Difficulty Walking,  Skin: No Rash or Lesions. Psych: No Change in Mood or Affect, No Depression or Anxiety, No Memory loss, No Confusion, or Hallucinations   Past Medical History  Diagnosis Date  . Diabetes mellitus without complication   . Hypertension   . Endocarditis      Past Surgical History  Procedure Laterality Date  . No past surgeries    . Video bronchoscopy Bilateral 12/03/2014    Procedure: VIDEO BRONCHOSCOPY WITHOUT FLUORO;  Surgeon: Chesley Mires, MD;  Location: [facility];  Service: Cardiopulmonary;  Laterality: Bilateral;      Prior to Admission medications   Medication Sig Start Date End Date Taking? Authorizing Provider  alum & mag hydroxide-simeth (MAALOX/MYLANTA) 200-200-20 MG/5ML suspension Take 30 mLs by mouth every 6 (six) hours as needed for indigestion or heartburn. 11/22/14  Yes Robbie Lis, MD  amLODipine (NORVASC) 10 MG tablet Take 1 tablet (10 mg total) by mouth daily. 12/08/14  Yes Arnoldo Morale, MD  Blood Glucose Monitoring Suppl (TRUE METRIX METER) W/DEVICE KIT 1 each by Does not apply route 4 (four) times daily - after meals and at bedtime. 12/08/14  Yes Arnoldo Morale, MD  folic acid (FOLVITE) 1 MG tablet Take 1 tablet (1 mg total) by mouth daily. 11/22/14  Yes Robbie Lis, MD  glimepiride (AMARYL) 2 MG tablet Take 1 tablet (2 mg total) by mouth daily with breakfast. 12/04/14  Yes Maryann Mikhail, DO  glucose blood test strip Use as instructed 12/07/14  Yes Enobong Amao,  MD  glucose blood test strip 1 each by Other route 4 (four) times daily -  before meals and at bedtime. Use as instructed 12/08/14  Yes Arnoldo Morale, MD  insulin aspart (NOVOLOG) 100 UNIT/ML injection Inject 7 Units into the skin 3 (three) times daily with meals. 11/22/14  Yes Robbie Lis, MD  insulin glargine (LANTUS) 100 UNIT/ML injection Inject 0.35 mLs (35 Units total) into the skin at bedtime.  11/22/14  Yes Robbie Lis, MD  linezolid (ZYVOX) 600 MG tablet Take 1 tablet (600 mg total) by mouth 2 (two) times daily. 12/11/14  Yes Arnoldo Morale, MD  Multiple Vitamin (MULTIVITAMIN WITH MINERALS) TABS tablet Take 1 tablet by mouth daily. 11/22/14  Yes Robbie Lis, MD  pantoprazole (PROTONIX) 40 MG tablet Take 1 tablet (40 mg total) by mouth daily. 11/22/14  Yes Robbie Lis, MD  glucose blood test strip Use as instructed Patient not taking: Reported on 12/11/2014 11/22/14   Robbie Lis, MD  lisinopril (PRINIVIL,ZESTRIL) 5 MG tablet Take 1 tablet (5 mg total) by mouth daily. Patient not taking: Reported on 12/11/2014 12/08/14   Arnoldo Morale, MD  tamsulosin (FLOMAX) 0.4 MG CAPS capsule Take 1 capsule (0.4 mg total) by mouth daily after supper. Patient not taking: Reported on 12/11/2014 11/22/14   Robbie Lis, MD  thiamine 100 MG tablet Take 1 tablet (100 mg total) by mouth daily. Patient not taking: Reported on 12/08/2014 11/22/14   Robbie Lis, MD  TRUEPLUS LANCETS 30G MISC 1 each by Does not apply route 4 (four) times daily - after meals and at bedtime. Patient not taking: Reported on 12/11/2014 12/08/14   Arnoldo Morale, MD  vancomycin (VANCOCIN) 1 GM/200ML SOLN Inject 300 mLs (1,500 mg total) into the vein every 12 (twelve) hours. Per home health protocol Patient not taking: Reported on 12/08/2014 12/04/14   Cristal Ford, DO     No Known Allergies  Social History:  reports that he has never smoked. He has never used smokeless tobacco. He reports that he does not drink alcohol or use illicit drugs.    Family History  Problem Relation Age of Onset  . Hypertension Mother   . Diabetes Sister   . Diabetes Brother        Physical Exam:  GEN:  Pleasant Well Nourished and Well Developed 54 y.o. African American male examined and in no acute distress; cooperative with exam Filed Vitals:   12/11/14 1417 12/11/14 1951  BP: 113/59 135/76  Pulse: 97 95  Temp: 98.2 F (36.8 C)   TempSrc: Oral     Resp: 16 16  SpO2: 99% 93%   Blood pressure 135/76, pulse 95, temperature 98.2 F (36.8 C), temperature source Oral, resp. rate 16, SpO2 93 %. PSYCH: He is alert and oriented x4; does not appear anxious does not appear depressed; affect is normal HEENT: Normocephalic and Atraumatic, Mucous membranes pink; PERRLA; EOM intact; Fundi:  Benign;  No scleral icterus, Nares: Patent, Oropharynx: Clear, Fair Dentition,    Neck:  FROM, No Cervical Lymphadenopathy nor Thyromegaly or Carotid Bruit; No JVD; Breasts:: Not examined CHEST WALL: No tenderness CHEST: Normal respiration, clear to auscultation bilaterally HEART: Regular rate and rhythm; no murmurs rubs or gallops BACK: No kyphosis or scoliosis; No CVA tenderness ABDOMEN: Positive Bowel Sounds, Soft Non-Tender, No Rebound or Guarding; No Masses, No Organomegaly, No Pannus; No Intertriginous candida. Rectal Exam: Not done EXTREMITIES: No Cyanosis, Clubbing, or Edema; No Ulcerations. Genitalia: not examined PULSES: 2+  and symmetric SKIN: Normal hydration no rash or ulceration CNS:  Alert and Oriented x 4, No Focal Deficits Vascular: pulses palpable throughout    Labs on Admission:  Basic Metabolic Panel:  Recent Labs Lab 12/08/14 1820 12/09/14 0500 12/11/14 1520  NA 137 138 133*  K 3.8 3.4* 4.0  CL 101 100* 101  CO2 27 30 26   GLUCOSE 235* 125* 331*  BUN 12 8 12   CREATININE 0.91 0.86 1.08  CALCIUM 8.5* 8.5* 8.3*   Liver Function Tests:  Recent Labs Lab 12/08/14 1820  AST 16  ALT 37  ALKPHOS 161*  BILITOT 0.1*  PROT 7.2  ALBUMIN 2.5*   No results for input(s): LIPASE, AMYLASE in the last 168 hours. No results for input(s): AMMONIA in the last 168 hours. CBC:  Recent Labs Lab 12/08/14 1820 12/09/14 0500 12/11/14 1520  WBC 10.9* 9.9 13.6*  NEUTROABS 7.6  --  11.0*  HGB 8.0* 8.3* 8.6*  HCT 25.5* 26.2* 28.0*  MCV 82.3 82.1 80.7  PLT 415* 459* 480*   Cardiac Enzymes: No results for input(s): CKTOTAL, CKMB,  CKMBINDEX, TROPONINI in the last 168 hours.  BNP (last 3 results) No results for input(s): BNP in the last 8760 hours.  ProBNP (last 3 results) No results for input(s): PROBNP in the last 8760 hours.  CBG:  Recent Labs Lab 12/09/14 1658 12/09/14 2204 12/10/14 0719 12/10/14 0837 12/10/14 1143  GLUCAP 119* 95 64* 93 118*    Radiological Exams on Admission: Mr Hip Left W Wo Contrast  12/11/2014   CLINICAL DATA:  Severe left hip pain for 3 days. Rule out septic arthritis. History of endocarditis.  EXAM: MRI OF THE LEFT HIP WITHOUT AND WITH CONTRAST  TECHNIQUE: Multiplanar, multisequence MR imaging was performed both before and after administration of intravenous contrast.  CONTRAST:  9m MULTIHANCE GADOBENATE DIMEGLUMINE 529 MG/ML IV SOLN  COMPARISON:  None.  FINDINGS: Bones: No acute fracture or dislocation. Severe marrow edema in the left femoral head with a subchondral cystic area.  No other marrow signal abnormality. Normal sacrum and sacroiliac joints.  Articular cartilage and labrum  Articular cartilage: Partial-thickness cartilage loss of bilateral hips, left greater than right.  Labrum:  No gross labral or paralabral abnormality.  Joint or bursal effusion  Joint effusion: Moderate left hip joint effusion without significant synovial enhancement. There is mild surrounding soft tissue enhancement.  Bursae:  No bursa formation.  Muscles and tendons  Muscles and tendons: There is edema within the obturator externus muscle bilaterally, left greater than right, which may reflect reactive edema versus muscle strain.  Other findings  Miscellaneous: Bladder wall thickening with a Foley catheter present within the bladder. There is severe surrounding edema around the bladder with mild enhancement. There is diffuse avid enhancement of the prostate gland with multiple nonenhancing areas within the prostate gland with the largest along the left mid gland measuring 2.2 x 1.4 cm which may reflect  prostatitis with an abscess.  IMPRESSION: 1. Moderate left hip joint effusion with severe edema in the left femoral head and mild surrounding soft tissue edema with enhancement. The appearance is most concerning for septic arthritis until proven otherwise. Early AVN is also in the differential diagnosis, but currently considered less likely given the overall patient history. If there is further clinical concern, recommend arthrocentesis. 2. Severe bladder wall thickening with a Foley catheter present and surrounding edema with enhancement concerning for cystitis. 3. Diffuse avid enhancement of the prostate gland with multiple nonenhancing areas within  the prostate gland with the largest along the left mid gland measuring 2.2 x 1.4 cm which may reflect prostatitis with an abscess. 4. Edema within the obturator externus muscle bilaterally, left greater than right, likely reflecting muscle strain versus reactive edema given the lack of enhancement.   Electronically Signed   By: Kathreen Devoid   On: 12/11/2014 19:00   Dg Hip Unilat With Pelvis 2-3 Views Left  12/11/2014   CLINICAL DATA:  Lateral left hip pain for 2-3 days. No known injury. Initial encounter.  EXAM: DG HIP (WITH OR WITHOUT PELVIS) 2-3V LEFT  COMPARISON:  None.  FINDINGS: There is no acute bony or joint abnormality. No evidence of avascular necrosis of the femoral heads is identified. No notable degenerative change is seen about the hips, symphysis pubis or sacroiliac joints. Imaged soft tissue structures are unremarkable.  IMPRESSION: Negative exam.   Electronically Signed   By: Inge Rise M.D.   On: 12/11/2014 15:05     EKG: Independently reviewed.    Assessment/Plan:   54 y.o. male with  Principal Problem:   1.    Septic arthritis   IV Vancomycin   Orthopedics : Dr Mardelle Matte consulting   PICC Line Placement this AM   Active Problems:   2.    Uncontrolled diabetes mellitus- Glucose =331 at admission,  But not in DKA   Lantus  Insulin   SSI coverage PRN   Check HbA1C     3.    Anemia   Anemia Panel ordered   FOBT q day x 3 ordered     4.    Essential hypertension   Resume Lisinopril rx   IV Hydralazine PRN SBP > 160     5.    Non-compliance with treatment   Counselled     6.    DVT Prophylaxis   SCDs          Code Status:     FULL CODE       Family Communication:   No Family Present    Disposition Plan:    Inpatient Status        Time spent:  [place] Pager [phone]   If [place] Please Contact the Day Rounding Team MD for [facility]  If 7PM-7AM, Please Contact Night-Floor Coverage  [email or web address] Password [email or web address] 12/12/2014, 2:44 AM     ADDENDUM:   Patient was seen and examined on 12/12/2014

## 2014-12-12 NOTE — Progress Notes (Signed)
I have spoken with [facility], there orthopedic surgeon, who did not feel that they had any higher level of care to offer. The surgeon on call indicated that she had no more experience with anterior hip surgery then I did, and recommended against transfer.  I will reevaluate his hip today, and consider surgical intervention for tomorrow if clinically consistent with sepsis. The picture is not clear, given the inability to aspirate fluid from the hip, and I don't have a clear diagnostic evidence that he truly has a septic joint. We will monitor him clinically, and possibly do surgery tomorrow if he is not significantly improving.  Marc Post, MD

## 2014-12-13 ENCOUNTER — Encounter (HOSPITAL_COMMUNITY)
Admission: EM | Disposition: A | Discharge status: Skilled Nursing Facility | Payer: Self-pay | Source: Home / Self Care | Attending: Internal Medicine

## 2014-12-13 DIAGNOSIS — M25559 Pain in unspecified hip: Secondary | ICD-10-CM

## 2014-12-13 LAB — CULTURE, BLOOD (ROUTINE X 2)
Culture: NO GROWTH
Culture: NO GROWTH

## 2014-12-13 LAB — GLUCOSE, CAPILLARY
Glucose-Capillary: 80 mg/dL (ref 65–99)
Glucose-Capillary: 115 mg/dL — ABNORMAL HIGH (ref 65–99)
Glucose-Capillary: 109 mg/dL — ABNORMAL HIGH (ref 65–99)

## 2014-12-13 LAB — GLUCOSE, SYNOVIAL FLUID: Glucose, Synovial Fluid: <20 mg/dL

## 2014-12-13 LAB — PROTEIN, SYNOVIAL FLUID: Protein, Synovial Fluid: <1 g/dL — ABNORMAL LOW (ref 1.0–3.0)

## 2014-12-13 SURGERY — IRRIGATION AND DEBRIDEMENT EXTREMITY
Anesthesia: Choice | Laterality: Left

## 2014-12-13 MED ORDER — INSULIN ASPART 100 UNIT/ML ~~LOC~~ SOLN
0.0000 [IU] | Freq: Three times a day (TID) | SUBCUTANEOUS | Status: DC
  Administered 2014-12-15 (×2): 5 [IU] via SUBCUTANEOUS
  Administered 2014-12-16: 3 [IU] via SUBCUTANEOUS

## 2014-12-13 NOTE — Progress Notes (Signed)
Patient ID: Marc [name], male   DOB: 08-Feb-1961, 54 y.o.   MRN: [id number]  Asked to participate in definitive care of [name] left hip problem Due to presenting medical issues and bacteremia it is presumed clinically that he has a septic left hip joint though attempted aspiration proved inadequate to interpret  Significant pain left hip with any movement  Had discussion with patient regarding options I feel best option is for resection of left hip joint and placement of antibiotic laden cemented total hip followed by 6 weeks of IV antibiotics i explained my rationale given clinical picture and MRI findings Questions encouraged answered and reviewed  NPO To OR tomorrow for stated procedure

## 2014-12-13 NOTE — Progress Notes (Signed)
[facility] PROGRESS NOTE  Marc [name] ZOX:[id number] DOB: December 10, 1960 DOA: 12/11/2014 PCP: No PCP Per Patient  Assessment/Plan: 53 year old gentleman with h/o uncontrolled DM, non compliance and MRSA bacteremia, supposed to be on 6 weeks of antibiotics was discharged to rehab but left AMA from rehab center with foley catheter, presents to ED with worsening left hip pain.  MRI hip shows worsening of the left hip with septic arthritis. Orthopedics consutled and plan to take him to OR in am for i&d.  Resume IV antibiotics and pain control.   Uncontrolled Diabetes mellitus: hgba1c 12.1 - resume SSI.    Anemia: normocytic, probably from anemia of chronic disease.   Hypertension; better controlled.    Code Status: FULL CODE  Family Communication: none at bedside.  Disposition Plan: pending further surgery.    Consultants:  orthopedics  Procedures:  none  Antibiotics:  Vancomycin.   HPI/Subjective: Reports pain controlled. But unable to move leg  Objective: Filed Vitals:   12/13/14 1359  BP: 121/78  Pulse: 101  Temp: 99.8 F (37.7 C)  Resp: 16    Intake/Output Summary (Last 24 hours) at 12/13/14 1631 Last data filed at 12/13/14 1400  Gross per 24 hour  Intake 3308.25 ml  Output    325 ml  Net 2983.25 ml   Filed Weights   12/12/14 0400  Weight: 77.111 kg (170 lb)    Exam:   General:  Alert afebrile comfortable  Cardiovascular: s1s2  Respiratory: ctab  Abdomen: soft NT nd bs+  Musculoskeletal: no pedal edema.   Data Reviewed: Basic Metabolic Panel:  Recent Labs Lab 12/08/14 1820 12/09/14 0500 12/11/14 1520 12/12/14 0525  NA 137 138 133* 134*  K 3.8 3.4* 4.0 4.1  CL 101 100* 101 102  CO2 27 30 26 27   GLUCOSE 235* 125* 331* 264*  BUN 12 8 12 10   CREATININE 0.91 0.86 1.08 0.96  CALCIUM 8.5* 8.5* 8.3* 8.4*   Liver Function Tests:  Recent Labs Lab 12/08/14 1820  AST 16  ALT 37  ALKPHOS 161*  BILITOT 0.1*  PROT  7.2  ALBUMIN 2.5*   No results for input(s): LIPASE, AMYLASE in the last 168 hours. No results for input(s): AMMONIA in the last 168 hours. CBC:  Recent Labs Lab 12/08/14 1820 12/09/14 0500 12/11/14 1520 12/12/14 0525  WBC 10.9* 9.9 13.6* 12.2*  NEUTROABS 7.6  --  11.0*  --   HGB 8.0* 8.3* 8.6* 8.5*  HCT 25.5* 26.2* 28.0* 27.3*  MCV 82.3 82.1 80.7 81.0  PLT 415* 459* 480* 486*   Cardiac Enzymes: No results for input(s): CKTOTAL, CKMB, CKMBINDEX, TROPONINI in the last 168 hours. BNP (last 3 results) No results for input(s): BNP in the last 8760 hours.  ProBNP (last 3 results) No results for input(s): PROBNP in the last 8760 hours.  CBG:  Recent Labs Lab 12/10/14 0837 12/10/14 1143 12/12/14 0949 12/12/14 2312 12/13/14 0751  GLUCAP 93 118* 253* 195* 80    Recent Results (from the past 240 hour(s))  Blood culture (routine x 2)     Status: None   Collection Time: 12/08/14  6:09 PM  Result Value Ref Range Status   Specimen Description BLOOD LEFT HAND  Final   Special Requests BOTTLES DRAWN AEROBIC AND ANAEROBIC 5CC EACH  Final   Culture   Final    NO GROWTH 5 DAYS Performed at [facility]    Report Status 12/13/2014 FINAL  Final  Blood culture (routine x 2)  Status: None   Collection Time: 12/08/14  6:30 PM  Result Value Ref Range Status   Specimen Description BLOOD L HAND  Final   Special Requests BOTTLES DRAWN AEROBIC AND ANAEROBIC 5CC  Final   Culture   Final    NO GROWTH 5 DAYS Performed at [facility]    Report Status 12/13/2014 FINAL  Final  Blood culture (routine x 2)     Status: None (Preliminary result)   Collection Time: 12/11/14  3:15 PM  Result Value Ref Range Status   Specimen Description BLOOD RIGHT ANTECUBITAL  Final   Special Requests BOTTLES DRAWN AEROBIC AND ANAEROBIC 5CC  Final   Culture   Final    NO GROWTH 2 DAYS Performed at [facility]    Report Status PENDING  Incomplete  Blood culture (routine x 2)      Status: None (Preliminary result)   Collection Time: 12/11/14  3:22 PM  Result Value Ref Range Status   Specimen Description BLOOD RIGHT HAND  Final   Special Requests BOTTLES DRAWN AEROBIC AND ANAEROBIC 4CC  Final   Culture   Final    NO GROWTH 2 DAYS Performed at [facility]    Report Status PENDING  Incomplete  Body fluid culture     Status: None (Preliminary result)   Collection Time: 12/11/14 10:51 PM  Result Value Ref Range Status   Specimen Description SYNOVIAL  Final   Special Requests NONE  Final   Gram Stain   Final    WBC PRESENT, PREDOMINANTLY PMN NO ORGANISMS SEEN    Culture   Final    NO GROWTH 1 DAY Performed at [facility]    Report Status PENDING  Incomplete     Studies: Mr Hip Left W Wo Contrast  12/11/2014   CLINICAL DATA:  Severe left hip pain for 3 days. Rule out septic arthritis. History of endocarditis.  EXAM: MRI OF THE LEFT HIP WITHOUT AND WITH CONTRAST  TECHNIQUE: Multiplanar, multisequence MR imaging was performed both before and after administration of intravenous contrast.  CONTRAST:  15mL MULTIHANCE GADOBENATE DIMEGLUMINE 529 MG/ML IV SOLN  COMPARISON:  None.  FINDINGS: Bones: No acute fracture or dislocation. Severe marrow edema in the left femoral head with a subchondral cystic area.  No other marrow signal abnormality. Normal sacrum and sacroiliac joints.  Articular cartilage and labrum  Articular cartilage: Partial-thickness cartilage loss of bilateral hips, left greater than right.  Labrum:  No gross labral or paralabral abnormality.  Joint or bursal effusion  Joint effusion: Moderate left hip joint effusion without significant synovial enhancement. There is mild surrounding soft tissue enhancement.  Bursae:  No bursa formation.  Muscles and tendons  Muscles and tendons: There is edema within the obturator externus muscle bilaterally, left greater than right, which may reflect reactive edema versus muscle strain.  Other findings   Miscellaneous: Bladder wall thickening with a Foley catheter present within the bladder. There is severe surrounding edema around the bladder with mild enhancement. There is diffuse avid enhancement of the prostate gland with multiple nonenhancing areas within the prostate gland with the largest along the left mid gland measuring 2.2 x 1.4 cm which may reflect prostatitis with an abscess.  IMPRESSION: 1. Moderate left hip joint effusion with severe edema in the left femoral head and mild surrounding soft tissue edema with enhancement. The appearance is most concerning for septic arthritis until proven otherwise. Early AVN is also in the differential diagnosis, but currently  considered less likely given the overall patient history. If there is further clinical concern, recommend arthrocentesis. 2. Severe bladder wall thickening with a Foley catheter present and surrounding edema with enhancement concerning for cystitis. 3. Diffuse avid enhancement of the prostate gland with multiple nonenhancing areas within the prostate gland with the largest along the left mid gland measuring 2.2 x 1.4 cm which may reflect prostatitis with an abscess. 4. Edema within the obturator externus muscle bilaterally, left greater than right, likely reflecting muscle strain versus reactive edema given the lack of enhancement.   Electronically Signed   By: Elige Ko   On: 12/11/2014 19:00   Dg Fluoro Guide Ndl Plcd/bx/inj/loc  12/12/2014   CLINICAL DATA:  Left hip pain and history of bacterial endocarditis. MRI has demonstrated a joint effusion and signal changes in the left femoral head.  EXAM: ARTHROCENTESIS OF THE LEFT HIP JOINT UNDER FLUOROSCOPY  FLUOROSCOPY TIME:  23 seconds.  PROCEDURE: Consent was obtained from the patient prior to the procedure. A time-out was performed. Overlying skin prepped with Betadine, draped in the usual sterile fashion, and infiltrated locally with 1% Lidocaine. A 22 gauge spinal needle was advanced to  the superolateral margin of the left femoral head under fluoroscopy. 1 ml of Lidocaine injected easily. Aspiration was performed. Additional aspiration was also performed with a 20 gauge spinal needle advanced more superiorly in the joint.  Lavage was performed through the 20 gauge needle with 10 mL of sterile saline. Aspirated fluid was sent for requested laboratory studies.  FINDINGS: There was inability to aspirate free fluid from the joint with only a small amount of blood aspirated initially. After sterile saline lavage, approximately 4 mL of slightly blood-tinged fluid was recovered and sent for laboratory testing.  IMPRESSION: Aspiration of left hip joint under fluoroscopy.   Electronically Signed   By: Irish Lack M.D.   On: 12/12/2014 08:23    Scheduled Meds: . amLODipine  10 mg Oral Daily  . folic acid  1 mg Oral Daily  . glimepiride  2 mg Oral Q breakfast  . insulin glargine  35 Units Subcutaneous QHS  . lisinopril  5 mg Oral Daily  . multivitamin with minerals  1 tablet Oral Daily  . pantoprazole  40 mg Oral Daily  . sodium chloride  10-40 mL Intracatheter Q12H  . vancomycin  1,250 mg Intravenous Q12H   Continuous Infusions: . sodium chloride 75 mL/hr at 12/13/14 1406    Principal Problem:   Endocarditis Active Problems:   Essential hypertension   Non-compliance with treatment   Septic arthritis   Uncontrolled diabetes mellitus   Anemia   Hip pain    Time spent: 25 minutes     Kristen Fromm  [facility] Pager [phone] . If 7PM-7AM, please contact night-coverage at [email or web address], password [facility] 12/13/2014, 4:31 PM  LOS: 1 day

## 2014-12-13 NOTE — Progress Notes (Signed)
I discussed the patient's care with Dr. Durene Romans, who has agreed to evaluate him in subspecialty consultation. Dr. Charlann Boxer was planning on seeing the patient today, July 24, and probable surgical intervention July 25. I have changed his nothing by mouth status, and canceled the consent from my standpoint, and Dr. Charlann Boxer is planning on assisting in his care. I greatly appreciate his subspecialty input.   Marc Post, MD

## 2014-12-14 ENCOUNTER — Inpatient Hospital Stay (HOSPITAL_COMMUNITY): Payer: Medicaid Other | Admitting: Anesthesiology

## 2014-12-14 ENCOUNTER — Ambulatory Visit: Payer: Self-pay | Admitting: Family Medicine

## 2014-12-14 ENCOUNTER — Encounter (HOSPITAL_COMMUNITY): Payer: Self-pay | Admitting: Anesthesiology

## 2014-12-14 ENCOUNTER — Encounter (HOSPITAL_COMMUNITY)
Admission: EM | Disposition: A | Discharge status: Skilled Nursing Facility | Payer: Self-pay | Source: Home / Self Care | Attending: Internal Medicine

## 2014-12-14 HISTORY — PX: TOTAL HIP REVISION: SHX763

## 2014-12-14 LAB — CBC WITH DIFFERENTIAL/PLATELET
BASOS ABS: 0 10*3/uL (ref 0.0–0.1)
BASOS PCT: 0 % (ref 0–1)
EOS ABS: 0.1 10*3/uL (ref 0.0–0.7)
EOS PCT: 1 % (ref 0–5)
HEMATOCRIT: 25.6 % — AB (ref 39.0–52.0)
HEMOGLOBIN: 8 g/dL — AB (ref 13.0–17.0)
LYMPHS ABS: 1.5 10*3/uL (ref 0.7–4.0)
Lymphocytes Relative: 10 % — ABNORMAL LOW (ref 12–46)
MCH: 25 pg — ABNORMAL LOW (ref 26.0–34.0)
MCHC: 31.3 g/dL (ref 30.0–36.0)
MCV: 80 fL (ref 78.0–100.0)
Monocytes Absolute: 1.3 10*3/uL — ABNORMAL HIGH (ref 0.1–1.0)
Monocytes Relative: 9 % (ref 3–12)
NEUTROS PCT: 80 % — AB (ref 43–77)
Neutro Abs: 11.8 10*3/uL — ABNORMAL HIGH (ref 1.7–7.7)
PLATELETS: 472 10*3/uL — AB (ref 150–400)
RBC: 3.2 MIL/uL — ABNORMAL LOW (ref 4.22–5.81)
RDW: 15.1 % (ref 11.5–15.5)
WBC: 14.8 10*3/uL — ABNORMAL HIGH (ref 4.0–10.5)

## 2014-12-14 LAB — BASIC METABOLIC PANEL
Anion gap: 7 (ref 5–15)
BUN: 10 mg/dL (ref 6–20)
CHLORIDE: 105 mmol/L (ref 101–111)
CO2: 28 mmol/L (ref 22–32)
Calcium: 8.4 mg/dL — ABNORMAL LOW (ref 8.9–10.3)
Creatinine, Ser: 0.8 mg/dL (ref 0.61–1.24)
GFR calc Af Amer: >60 mL/min (ref >60)
Glucose, Bld: 81 mg/dL (ref 65–99)
Potassium: 3.6 mmol/L (ref 3.5–5.1)
Sodium: 140 mmol/L (ref 135–145)

## 2014-12-14 LAB — GLUCOSE, CAPILLARY
GLUCOSE-CAPILLARY: 53 mg/dL — AB (ref 65–99)
GLUCOSE-CAPILLARY: 84 mg/dL (ref 65–99)
Glucose-Capillary: 117 mg/dL — ABNORMAL HIGH (ref 65–99)
Glucose-Capillary: 72 mg/dL (ref 65–99)
Glucose-Capillary: 77 mg/dL (ref 65–99)
Glucose-Capillary: 96 mg/dL (ref 65–99)
Glucose-Capillary: 195 mg/dL — ABNORMAL HIGH (ref 65–99)

## 2014-12-14 LAB — SURGICAL PCR SCREEN
MRSA, PCR: POSITIVE — AB
STAPHYLOCOCCUS AUREUS: POSITIVE — AB

## 2014-12-14 SURGERY — TOTAL HIP REVISION
Anesthesia: General | Site: Hip | Laterality: Left

## 2014-12-14 MED ORDER — FENTANYL CITRATE (PF) 100 MCG/2ML IJ SOLN
INTRAMUSCULAR | Status: AC
  Filled 2014-12-14: qty 2

## 2014-12-14 MED ORDER — SODIUM CHLORIDE 0.9 % IV SOLN
INTRAVENOUS | Status: DC | PRN
  Administered 2014-12-14: 17:00:00 via INTRAVENOUS

## 2014-12-14 MED ORDER — PROPOFOL 10 MG/ML IV BOLUS
INTRAVENOUS | Status: DC | PRN
  Administered 2014-12-14: 200 mg via INTRAVENOUS

## 2014-12-14 MED ORDER — DEXTROSE-NACL 5-0.9 % IV SOLN
INTRAVENOUS | Status: DC
  Administered 2014-12-14: 10:00:00 via INTRAVENOUS

## 2014-12-14 MED ORDER — DOCUSATE SODIUM 100 MG PO CAPS
100.0000 mg | ORAL_CAPSULE | Freq: Two times a day (BID) | ORAL | Status: DC
  Administered 2014-12-14 – 2014-12-18 (×6): 100 mg via ORAL

## 2014-12-14 MED ORDER — ROCURONIUM BROMIDE 100 MG/10ML IV SOLN
INTRAVENOUS | Status: AC
  Filled 2014-12-14: qty 1

## 2014-12-14 MED ORDER — PHENYLEPHRINE 40 MCG/ML (10ML) SYRINGE FOR IV PUSH (FOR BLOOD PRESSURE SUPPORT)
PREFILLED_SYRINGE | INTRAVENOUS | Status: AC
  Filled 2014-12-14: qty 10

## 2014-12-14 MED ORDER — HYDROMORPHONE HCL 1 MG/ML IJ SOLN
INTRAMUSCULAR | Status: AC
  Filled 2014-12-14: qty 1

## 2014-12-14 MED ORDER — FERROUS SULFATE 325 (65 FE) MG PO TABS
325.0000 mg | ORAL_TABLET | Freq: Three times a day (TID) | ORAL | Status: DC
  Administered 2014-12-14 – 2014-12-18 (×10): 325 mg via ORAL
  Filled 2014-12-14 (×14): qty 1

## 2014-12-14 MED ORDER — SODIUM CHLORIDE 0.9 % IJ SOLN
INTRAMUSCULAR | Status: AC
  Filled 2014-12-14: qty 10

## 2014-12-14 MED ORDER — TOBRAMYCIN SULFATE 1.2 G IJ SOLR
INTRAMUSCULAR | Status: DC | PRN
  Administered 2014-12-14: 3.6 g

## 2014-12-14 MED ORDER — NEOSTIGMINE METHYLSULFATE 10 MG/10ML IV SOLN
INTRAVENOUS | Status: AC
  Filled 2014-12-14: qty 1

## 2014-12-14 MED ORDER — GLYCOPYRROLATE 0.2 MG/ML IJ SOLN
INTRAMUSCULAR | Status: AC
  Filled 2014-12-14: qty 3

## 2014-12-14 MED ORDER — ASPIRIN EC 325 MG PO TBEC
325.0000 mg | DELAYED_RELEASE_TABLET | Freq: Two times a day (BID) | ORAL | Status: DC
  Administered 2014-12-15 – 2014-12-18 (×7): 325 mg via ORAL
  Filled 2014-12-14 (×9): qty 1

## 2014-12-14 MED ORDER — CELECOXIB 200 MG PO CAPS
200.0000 mg | ORAL_CAPSULE | Freq: Two times a day (BID) | ORAL | Status: DC
  Administered 2014-12-14 – 2014-12-18 (×8): 200 mg via ORAL
  Filled 2014-12-14 (×9): qty 1

## 2014-12-14 MED ORDER — FENTANYL CITRATE (PF) 250 MCG/5ML IJ SOLN
INTRAMUSCULAR | Status: DC | PRN
  Administered 2014-12-14: 100 ug via INTRAVENOUS

## 2014-12-14 MED ORDER — PROMETHAZINE HCL 25 MG/ML IJ SOLN: 6.2500 mg | INTRAMUSCULAR | Status: DC | PRN

## 2014-12-14 MED ORDER — METHOCARBAMOL 500 MG PO TABS
500.0000 mg | ORAL_TABLET | Freq: Four times a day (QID) | ORAL | Status: DC | PRN
  Administered 2014-12-15 – 2014-12-17 (×5): 500 mg via ORAL
  Filled 2014-12-14 (×5): qty 1

## 2014-12-14 MED ORDER — VANCOMYCIN HCL 1000 MG IV SOLR
INTRAVENOUS | Status: AC
  Filled 2014-12-14: qty 3000

## 2014-12-14 MED ORDER — PHENYLEPHRINE HCL 10 MG/ML IJ SOLN
INTRAMUSCULAR | Status: DC | PRN
  Administered 2014-12-14 (×2): 80 ug via INTRAVENOUS

## 2014-12-14 MED ORDER — DEXAMETHASONE SODIUM PHOSPHATE 10 MG/ML IJ SOLN
INTRAMUSCULAR | Status: DC | PRN
  Administered 2014-12-14: 10 mg via INTRAVENOUS

## 2014-12-14 MED ORDER — GLYCOPYRROLATE 0.2 MG/ML IJ SOLN
INTRAMUSCULAR | Status: DC | PRN
  Administered 2014-12-14: 0.6 mg via INTRAVENOUS

## 2014-12-14 MED ORDER — METOCLOPRAMIDE HCL 10 MG PO TABS: 5.0000 mg | ORAL_TABLET | Freq: Three times a day (TID) | ORAL | Status: DC | PRN

## 2014-12-14 MED ORDER — ESMOLOL HCL 10 MG/ML IV SOLN
INTRAVENOUS | Status: DC | PRN
  Administered 2014-12-14 (×2): 20 mg via INTRAVENOUS

## 2014-12-14 MED ORDER — CEFAZOLIN SODIUM-DEXTROSE 2-3 GM-% IV SOLR
2.0000 g | Freq: Four times a day (QID) | INTRAVENOUS | Status: AC
  Administered 2014-12-14 – 2014-12-15 (×2): 2 g via INTRAVENOUS
  Filled 2014-12-14 (×2): qty 50

## 2014-12-14 MED ORDER — ROCURONIUM BROMIDE 100 MG/10ML IV SOLN
INTRAVENOUS | Status: DC | PRN
  Administered 2014-12-14: 10 mg via INTRAVENOUS
  Administered 2014-12-14: 40 mg via INTRAVENOUS
  Administered 2014-12-14: 10 mg via INTRAVENOUS

## 2014-12-14 MED ORDER — MAGNESIUM CITRATE PO SOLN: 1.0000 | Freq: Once | ORAL | Status: AC | PRN

## 2014-12-14 MED ORDER — DEXAMETHASONE SODIUM PHOSPHATE 10 MG/ML IJ SOLN
INTRAMUSCULAR | Status: AC
  Filled 2014-12-14: qty 1

## 2014-12-14 MED ORDER — MENTHOL 3 MG MT LOZG: 1.0000 | LOZENGE | OROMUCOSAL | Status: DC | PRN

## 2014-12-14 MED ORDER — POTASSIUM CHLORIDE 2 MEQ/ML IV SOLN
100.0000 mL/h | INTRAVENOUS | Status: DC
Start: 2014-12-14 — End: 2014-12-16
  Administered 2014-12-14: 100 mL/h via INTRAVENOUS
  Filled 2014-12-14 (×9): qty 1000

## 2014-12-14 MED ORDER — PROPOFOL 10 MG/ML IV BOLUS
INTRAVENOUS | Status: AC
  Filled 2014-12-14: qty 20

## 2014-12-14 MED ORDER — METHOCARBAMOL 1000 MG/10ML IJ SOLN
500.0000 mg | Freq: Four times a day (QID) | INTRAVENOUS | Status: DC | PRN
  Filled 2014-12-14: qty 5

## 2014-12-14 MED ORDER — DEXTROSE 50 % IV SOLN
25.0000 mL | Freq: Once | INTRAVENOUS | Status: AC
  Administered 2014-12-14: 25 mL via INTRAVENOUS
  Filled 2014-12-14: qty 50

## 2014-12-14 MED ORDER — VANCOMYCIN HCL 1000 MG IV SOLR
INTRAVENOUS | Status: DC | PRN
  Administered 2014-12-14: 3000 mg

## 2014-12-14 MED ORDER — METOCLOPRAMIDE HCL 5 MG/ML IJ SOLN: 5.0000 mg | Freq: Three times a day (TID) | INTRAMUSCULAR | Status: DC | PRN

## 2014-12-14 MED ORDER — DIPHENHYDRAMINE HCL 25 MG PO CAPS: 25.0000 mg | ORAL_CAPSULE | Freq: Four times a day (QID) | ORAL | Status: DC | PRN

## 2014-12-14 MED ORDER — ESMOLOL HCL 10 MG/ML IV SOLN
INTRAVENOUS | Status: AC
  Filled 2014-12-14: qty 10

## 2014-12-14 MED ORDER — DEXTROSE 50 % IV SOLN
INTRAVENOUS | Status: AC
  Filled 2014-12-14: qty 50

## 2014-12-14 MED ORDER — TOBRAMYCIN SULFATE 1.2 G IJ SOLR
INTRAMUSCULAR | Status: AC
  Filled 2014-12-14: qty 3.6

## 2014-12-14 MED ORDER — ONDANSETRON HCL 4 MG/2ML IJ SOLN
INTRAMUSCULAR | Status: AC
  Filled 2014-12-14: qty 2

## 2014-12-14 MED ORDER — PHENOL 1.4 % MT LIQD: 1.0000 | OROMUCOSAL | Status: DC | PRN

## 2014-12-14 MED ORDER — LACTATED RINGERS IV SOLN
INTRAVENOUS | Status: DC
  Administered 2014-12-14: 1000 mL via INTRAVENOUS

## 2014-12-14 MED ORDER — SUCCINYLCHOLINE CHLORIDE 20 MG/ML IJ SOLN
INTRAMUSCULAR | Status: DC | PRN
  Administered 2014-12-14: 100 mg via INTRAVENOUS

## 2014-12-14 MED ORDER — HYDROMORPHONE HCL 1 MG/ML IJ SOLN
0.2500 mg | INTRAMUSCULAR | Status: DC | PRN
  Administered 2014-12-14: 0.5 mg via INTRAVENOUS

## 2014-12-14 MED ORDER — ONDANSETRON HCL 4 MG/2ML IJ SOLN
INTRAMUSCULAR | Status: DC | PRN
  Administered 2014-12-14: 4 mg via INTRAVENOUS

## 2014-12-14 MED ORDER — MIDAZOLAM HCL 5 MG/5ML IJ SOLN
INTRAMUSCULAR | Status: DC | PRN
  Administered 2014-12-14: 2 mg via INTRAVENOUS

## 2014-12-14 MED ORDER — BISACODYL 10 MG RE SUPP: 10.0000 mg | Freq: Every day | RECTAL | Status: DC | PRN

## 2014-12-14 MED ORDER — DEXAMETHASONE SODIUM PHOSPHATE 10 MG/ML IJ SOLN
10.0000 mg | Freq: Once | INTRAMUSCULAR | Status: DC
  Filled 2014-12-14: qty 1

## 2014-12-14 MED ORDER — OXYCODONE HCL 5 MG PO TABS
5.0000 mg | ORAL_TABLET | ORAL | Status: DC | PRN
  Administered 2014-12-14 – 2014-12-15 (×2): 5 mg via ORAL
  Administered 2014-12-16: 10 mg via ORAL
  Administered 2014-12-16: 5 mg via ORAL
  Administered 2014-12-17 – 2014-12-18 (×6): 10 mg via ORAL
  Filled 2014-12-14 (×2): qty 2
  Filled 2014-12-14: qty 1
  Filled 2014-12-14: qty 2
  Filled 2014-12-14: qty 1
  Filled 2014-12-14: qty 2
  Filled 2014-12-14: qty 1
  Filled 2014-12-14 (×4): qty 2

## 2014-12-14 MED ORDER — NEOSTIGMINE METHYLSULFATE 10 MG/10ML IV SOLN
INTRAVENOUS | Status: DC | PRN
  Administered 2014-12-14: 4 mg via INTRAVENOUS

## 2014-12-14 MED ORDER — MIDAZOLAM HCL 2 MG/2ML IJ SOLN
INTRAMUSCULAR | Status: AC
  Filled 2014-12-14: qty 2

## 2014-12-14 MED ORDER — HYDROMORPHONE HCL 1 MG/ML IJ SOLN
INTRAMUSCULAR | Status: DC | PRN
  Administered 2014-12-14: 1 mg via INTRAVENOUS

## 2014-12-14 MED ORDER — HYDROMORPHONE HCL 2 MG/ML IJ SOLN
INTRAMUSCULAR | Status: AC
  Filled 2014-12-14: qty 1

## 2014-12-14 MED ORDER — POLYETHYLENE GLYCOL 3350 17 G PO PACK
17.0000 g | PACK | Freq: Two times a day (BID) | ORAL | Status: DC
  Administered 2014-12-14 – 2014-12-17 (×3): 17 g via ORAL

## 2014-12-14 SURGICAL SUPPLY — 65 items
BAG DECANTER FOR FLEXI CONT (MISCELLANEOUS) ×3 IMPLANT
BAG ZIPLOCK 12X15 (MISCELLANEOUS) ×3 IMPLANT
BLADE SAW SGTL 18X1.27X75 (BLADE) ×2 IMPLANT
BLADE SAW SGTL 18X1.27X75MM (BLADE) ×1
BRUSH FEMORAL CANAL (MISCELLANEOUS) IMPLANT
CEMENT HV SMART SET (Cement) ×9 IMPLANT
CONT SPEC 4OZ CLIKSEAL STRL BL (MISCELLANEOUS) ×3 IMPLANT
DRAPE INCISE IOBAN 85X60 (DRAPES) ×3 IMPLANT
DRAPE ORTHO SPLIT 77X108 STRL (DRAPES) ×4
DRAPE POUCH INSTRU U-SHP 10X18 (DRAPES) ×3 IMPLANT
DRAPE SURG 17X11 SM STRL (DRAPES) ×3 IMPLANT
DRAPE SURG ORHT 6 SPLT 77X108 (DRAPES) ×2 IMPLANT
DRAPE U-SHAPE 47X51 STRL (DRAPES) ×3 IMPLANT
DRSG AQUACEL AG ADV 3.5X10 (GAUZE/BANDAGES/DRESSINGS) IMPLANT
DRSG AQUACEL AG ADV 3.5X14 (GAUZE/BANDAGES/DRESSINGS) ×3 IMPLANT
DURAPREP 26ML APPLICATOR (WOUND CARE) ×3 IMPLANT
ELECT BLADE TIP CTD 4 INCH (ELECTRODE) ×3 IMPLANT
ELECT REM PT RETURN 9FT ADLT (ELECTROSURGICAL) ×3
ELECTRODE REM PT RTRN 9FT ADLT (ELECTROSURGICAL) ×1 IMPLANT
FACESHIELD WRAPAROUND (MASK) ×12 IMPLANT
GAUZE SPONGE 2X2 8PLY STRL LF (GAUZE/BANDAGES/DRESSINGS) ×1 IMPLANT
GAUZE SPONGE 4X4 12PLY STRL (GAUZE/BANDAGES/DRESSINGS) ×3 IMPLANT
GAUZE XEROFORM 1X8 LF (GAUZE/BANDAGES/DRESSINGS) ×3 IMPLANT
GLOVE BIOGEL PI IND STRL 7.5 (GLOVE) ×1 IMPLANT
GLOVE BIOGEL PI IND STRL 8.5 (GLOVE) ×1 IMPLANT
GLOVE BIOGEL PI INDICATOR 7.5 (GLOVE) ×2
GLOVE BIOGEL PI INDICATOR 8.5 (GLOVE) ×2
GLOVE ECLIPSE 8.0 STRL XLNG CF (GLOVE) ×6 IMPLANT
GOWN SPEC L3 XXLG W/TWL (GOWN DISPOSABLE) ×6 IMPLANT
GOWN STRL REUS W/TWL LRG LVL3 (GOWN DISPOSABLE) ×3 IMPLANT
HANDPIECE INTERPULSE COAX TIP (DISPOSABLE)
HEAD FEM STD 32X+1 STRL (Hips) ×3 IMPLANT
KIT BASIN OR (CUSTOM PROCEDURE TRAY) ×3 IMPLANT
LINER ACET CUP 42MMX32MM (Hips) ×3 IMPLANT
LIQUID BAND (GAUZE/BANDAGES/DRESSINGS) ×3 IMPLANT
MANIFOLD NEPTUNE II (INSTRUMENTS) ×3 IMPLANT
NDL SAFETY ECLIPSE 18X1.5 (NEEDLE) ×1 IMPLANT
NEEDLE HYPO 18GX1.5 SHARP (NEEDLE) ×2
NS IRRIG 1000ML POUR BTL (IV SOLUTION) ×6 IMPLANT
PACK TOTAL JOINT (CUSTOM PROCEDURE TRAY) ×3 IMPLANT
PADDING CAST COTTON 6X4 STRL (CAST SUPPLIES) ×3 IMPLANT
PEN SKIN MARKING BROAD (MISCELLANEOUS) ×3 IMPLANT
POSITIONER SURGICAL ARM (MISCELLANEOUS) ×3 IMPLANT
PRESSURIZER FEMORAL UNIV (MISCELLANEOUS) IMPLANT
SET HNDPC FAN SPRY TIP SCT (DISPOSABLE) IMPLANT
SPONGE GAUZE 2X2 STER 10/PKG (GAUZE/BANDAGES/DRESSINGS) ×2
SPONGE LAP 18X18 X RAY DECT (DISPOSABLE) ×3 IMPLANT
SPONGE LAP 4X18 X RAY DECT (DISPOSABLE) ×3 IMPLANT
STAPLER VISISTAT 35W (STAPLE) ×3 IMPLANT
STEM CEMENTED SUMMIT SZ2 (Stem) ×3 IMPLANT
SUCTION FRAZIER TIP 10 FR DISP (SUCTIONS) ×3 IMPLANT
SUT PDS AB 1 CT1 27 (SUTURE) ×6 IMPLANT
SUT VIC AB 1 CT1 36 (SUTURE) IMPLANT
SUT VIC AB 2-0 CT1 27 (SUTURE) ×2
SUT VIC AB 2-0 CT1 TAPERPNT 27 (SUTURE) ×1 IMPLANT
SUT VLOC 180 0 24IN GS25 (SUTURE) ×6 IMPLANT
SWAB COLLECTION DEVICE MRSA (MISCELLANEOUS) ×3 IMPLANT
TAPE CLOTH SURG 4X10 WHT LF (GAUZE/BANDAGES/DRESSINGS) ×3 IMPLANT
TOWEL OR 17X26 10 PK STRL BLUE (TOWEL DISPOSABLE) ×6 IMPLANT
TOWER CARTRIDGE SMART MIX (DISPOSABLE) IMPLANT
TRAY FOLEY W/METER SILVER 14FR (SET/KITS/TRAYS/PACK) ×3 IMPLANT
TUBE ANAEROBIC SPECIMEN COL (MISCELLANEOUS) ×3 IMPLANT
TUBE KAMVAC SUCTION (TUBING) IMPLANT
WATER STERILE IRR 1500ML POUR (IV SOLUTION) ×3 IMPLANT
YANKAUER SUCT BULB TIP 10FT TU (MISCELLANEOUS) ×3 IMPLANT

## 2014-12-14 NOTE — Progress Notes (Signed)
Quick Note:  Patient is currently admitted. ______

## 2014-12-14 NOTE — Anesthesia Postprocedure Evaluation (Signed)
  Anesthesia Post-op Note  Patient: Marc [name]  Procedure(s) Performed: Procedure(s) (LRB): Left hip resection with prostalac  (Left)  Patient Location: PACU  Anesthesia Type: General  Level of Consciousness: awake and alert   Airway and Oxygen Therapy: Patient Spontanous Breathing  Post-op Pain: mild  Post-op Assessment: Post-op Vital signs reviewed, Patient's Cardiovascular Status Stable, Respiratory Function Stable, Patent Airway and No signs of Nausea or vomiting  Last Vitals:  Filed Vitals:   12/14/14 1948  BP: 143/85  Pulse: 108  Temp: 36.8 C  Resp: 16    Post-op Vital Signs: stable   Complications: No apparent anesthesia complications

## 2014-12-14 NOTE — H&P (View-Only) (Signed)
Patient ID: Marc [name], male   DOB: 01/21/1961, 54 y.o.   MRN: [id number]  Asked to participate in definitive care of [name]'s left hip problem Due to presenting medical issues and bacteremia it is presumed clinically that he has a septic left hip joint though attempted aspiration proved inadequate to interpret  Significant pain left hip with any movement  Had discussion with patient regarding options I feel best option is for resection of left hip joint and placement of antibiotic laden cemented total hip followed by 6 weeks of IV antibiotics i explained my rationale given clinical picture and MRI findings Questions encouraged answered and reviewed  NPO To OR tomorrow for stated procedure 

## 2014-12-14 NOTE — Transfer of Care (Signed)
Immediate Anesthesia Transfer of Care Note  Patient: Marc [name]  Procedure(s) Performed: Procedure(s): Left hip resection with prostalac  (Left)  Patient Location: PACU  Anesthesia Type:General  Level of Consciousness: sedated  Airway & Oxygen Therapy: Patient Spontanous Breathing and Patient connected to face mask oxygen  Post-op Assessment: Report given to RN and Post -op Vital signs reviewed and stable  Post vital signs: Reviewed and stable  Last Vitals:  Filed Vitals:   12/14/14 1451  BP: 132/78  Pulse: 104  Temp: 37.4 C  Resp: 16    Complications: No apparent anesthesia complications

## 2014-12-14 NOTE — Progress Notes (Signed)
[facility] PROGRESS NOTE  MARTAVIUS LUSTY ZOX:[id number] DOB: 22-Aug-1960 DOA: 12/11/2014 PCP: No PCP Per Patient  Assessment/Plan: 54 year old gentleman with h/o uncontrolled DM, non compliance and MRSA bacteremia, supposed to be on 6 weeks of antibiotics was discharged to rehab but left AMA from rehab center with foley catheter, presents to ED with worsening left hip pain.  MRI hip shows worsening of the left hip with septic arthritis. Orthopedics consutled and plan to take him to OR today for washout.  Resume IV antibiotics and pain control.   Uncontrolled Diabetes mellitus: hgba1c 12.1 - resume SSI.  CBG (last 3)   Recent Labs  12/14/14 0921 12/14/14 0947 12/14/14 1211  GLUCAP 53* 117* 72       Anemia: normocytic, probably from anemia of chronic disease.   Hypertension; better controlled.    Code Status: FULL CODE  Family Communication: none at bedside.  Disposition Plan: pending further surgery.    Consultants:  orthopedics  Procedures:  none  Antibiotics:  Vancomycin.   HPI/Subjective: Reports pain controlled. But unable to move leg  Objective: Filed Vitals:   12/14/14 1451  BP: 132/78  Pulse: 104  Temp: 99.3 F (37.4 C)  Resp: 16    Intake/Output Summary (Last 24 hours) at 12/14/14 1640 Last data filed at 12/14/14 1630  Gross per 24 hour  Intake   1275 ml  Output   1850 ml  Net   -575 ml   Filed Weights   12/12/14 0400  Weight: 77.111 kg (170 lb)    Exam:   General:  Alert afebrile comfortable  Cardiovascular: s1s2  Respiratory: ctab  Abdomen: soft NT nd bs+  Musculoskeletal: no pedal edema.   Data Reviewed: Basic Metabolic Panel:  Recent Labs Lab 12/08/14 1820 12/09/14 0500 12/11/14 1520 12/12/14 0525 12/14/14 0420  NA 137 138 133* 134* 140  K 3.8 3.4* 4.0 4.1 3.6  CL 101 100* 101 102 105  CO2 27 30 26 27 28   GLUCOSE 235* 125* 331* 264* 81  BUN 12 8 12 10 10   CREATININE 0.91 0.86 1.08 0.96 0.80   CALCIUM 8.5* 8.5* 8.3* 8.4* 8.4*   Liver Function Tests:  Recent Labs Lab 12/08/14 1820  AST 16  ALT 37  ALKPHOS 161*  BILITOT 0.1*  PROT 7.2  ALBUMIN 2.5*   No results for input(s): LIPASE, AMYLASE in the last 168 hours. No results for input(s): AMMONIA in the last 168 hours. CBC:  Recent Labs Lab 12/08/14 1820 12/09/14 0500 12/11/14 1520 12/12/14 0525 12/14/14 0420  WBC 10.9* 9.9 13.6* 12.2* 14.8*  NEUTROABS 7.6  --  11.0*  --  11.8*  HGB 8.0* 8.3* 8.6* 8.5* 8.0*  HCT 25.5* 26.2* 28.0* 27.3* 25.6*  MCV 82.3 82.1 80.7 81.0 80.0  PLT 415* 459* 480* 486* 472*   Cardiac Enzymes: No results for input(s): CKTOTAL, CKMB, CKMBINDEX, TROPONINI in the last 168 hours. BNP (last 3 results) No results for input(s): BNP in the last 8760 hours.  ProBNP (last 3 results) No results for input(s): PROBNP in the last 8760 hours.  CBG:  Recent Labs Lab 12/13/14 1823 12/13/14 2240 12/14/14 0921 12/14/14 0947 12/14/14 1211  GLUCAP 115* 109* 53* 117* 72    Recent Results (from the past 240 hour(s))  Blood culture (routine x 2)     Status: None   Collection Time: 12/08/14  6:09 PM  Result Value Ref Range Status   Specimen Description BLOOD LEFT HAND  Final   Special Requests  BOTTLES DRAWN AEROBIC AND ANAEROBIC 5CC EACH  Final   Culture   Final    NO GROWTH 5 DAYS Performed at [facility]    Report Status 12/13/2014 FINAL  Final  Blood culture (routine x 2)     Status: None   Collection Time: 12/08/14  6:30 PM  Result Value Ref Range Status   Specimen Description BLOOD L HAND  Final   Special Requests BOTTLES DRAWN AEROBIC AND ANAEROBIC 5CC  Final   Culture   Final    NO GROWTH 5 DAYS Performed at [facility]    Report Status 12/13/2014 FINAL  Final  Blood culture (routine x 2)     Status: None (Preliminary result)   Collection Time: 12/11/14  3:15 PM  Result Value Ref Range Status   Specimen Description BLOOD RIGHT ANTECUBITAL  Final   Special  Requests BOTTLES DRAWN AEROBIC AND ANAEROBIC 5CC  Final   Culture   Final    NO GROWTH 3 DAYS Performed at [facility]    Report Status PENDING  Incomplete  Blood culture (routine x 2)     Status: None (Preliminary result)   Collection Time: 12/11/14  3:22 PM  Result Value Ref Range Status   Specimen Description BLOOD RIGHT HAND  Final   Special Requests BOTTLES DRAWN AEROBIC AND ANAEROBIC 4CC  Final   Culture   Final    NO GROWTH 3 DAYS Performed at [facility]    Report Status PENDING  Incomplete  Body fluid culture     Status: None (Preliminary result)   Collection Time: 12/11/14 10:51 PM  Result Value Ref Range Status   Specimen Description SYNOVIAL  Final   Special Requests NONE  Final   Gram Stain   Final    WBC PRESENT, PREDOMINANTLY PMN NO ORGANISMS SEEN    Culture   Final    NO GROWTH 2 DAYS Performed at [facility]    Report Status PENDING  Incomplete  Surgical pcr screen     Status: Abnormal   Collection Time: 12/14/14  6:40 AM  Result Value Ref Range Status   MRSA, PCR POSITIVE (A) NEGATIVE Final   Staphylococcus aureus POSITIVE (A) NEGATIVE Final    Comment:        The Xpert SA Assay (FDA approved for NASAL specimens in patients over 73 years of age), is one component of a comprehensive surveillance program.  Test performance has been validated by [facility] for patients greater than or equal to 68 year old. It is not intended to diagnose infection nor to guide or monitor treatment. CRITICAL RESULT CALLED TO, READ BACK BY AND VERIFIED WITH: S.WHITTMORE AT 0820 ON 12/14/14 BY S.VANHOORNE      Studies: No results found.  Scheduled Meds: . [MAR Hold] amLODipine  10 mg Oral Daily  . dextrose      . [MAR Hold] folic acid  1 mg Oral Daily  . [MAR Hold] glimepiride  2 mg Oral Q breakfast  . [MAR Hold] insulin aspart  0-15 Units Subcutaneous TID WC  . [MAR Hold] insulin glargine  35 Units Subcutaneous QHS  . [MAR Hold]  lisinopril  5 mg Oral Daily  . [MAR Hold] multivitamin with minerals  1 tablet Oral Daily  . [MAR Hold] pantoprazole  40 mg Oral Daily  . [MAR Hold] sodium chloride  10-40 mL Intracatheter Q12H  . [MAR Hold] vancomycin  1,250 mg Intravenous Q12H   Continuous Infusions: .  dextrose 5 % and 0.9% NaCl 75 mL/hr at 12/14/14 1029  . lactated ringers 1,000 mL (12/14/14 1529)    Principal Problem:   Endocarditis Active Problems:   Essential hypertension   Non-compliance with treatment   Septic arthritis   Uncontrolled diabetes mellitus   Anemia   Hip pain    Time spent: 25 minutes     Janean Eischen  [facility] Pager [phone] . If 7PM-7AM, please contact night-coverage at [email or web address], password [facility] 12/14/2014, 4:40 PM  LOS: 2 days

## 2014-12-14 NOTE — Progress Notes (Signed)
Hypoglycemic Event  CBG: 53   Treatment: D50 IV 25 mL  Symptoms: Shaky, chills  Follow-up CBG: Time:0947 CBG Result:117  Possible Reasons for Event: Other: Patient NPO  Comments/MD notified:Akula, MD notified at 0930 am.     Deneise Lever J  Remember to initiate Hypoglycemia Order Set & complete

## 2014-12-14 NOTE — Anesthesia Preprocedure Evaluation (Addendum)
Anesthesia Evaluation  Patient identified by MRN, date of birth, ID band Patient awake    Reviewed: Allergy & Precautions, NPO status , Patient's Chart, lab work & pertinent test results  Airway Mallampati: II  TM Distance: >3 FB Neck ROM: Full    Dental no notable dental hx.    Pulmonary neg pulmonary ROS,  breath sounds clear to auscultation  Pulmonary exam normal       Cardiovascular hypertension, Pt. on medications Normal cardiovascular examRhythm:Regular Rate:Normal     Neuro/Psych negative neurological ROS  negative psych ROS   GI/Hepatic negative GI ROS, Neg liver ROS,   Endo/Other  diabetes, Type 2, Oral Hypoglycemic Agents, Insulin Dependent  Renal/GU Renal disease  negative genitourinary   Musculoskeletal  (+) Arthritis -,   Abdominal   Peds negative pediatric ROS (+)  Hematology  (+) anemia ,   Anesthesia Other Findings   Reproductive/Obstetrics negative OB ROS                            Anesthesia Physical Anesthesia Plan  ASA: III  Anesthesia Plan: General   Post-op Pain Management:    Induction: Intravenous  Airway Management Planned: Oral ETT  Additional Equipment:   Intra-op Plan:   Post-operative Plan: Extubation in OR  Informed Consent: I have reviewed the patients History and Physical, chart, labs and discussed the procedure including the risks, benefits and alternatives for the proposed anesthesia with the patient or authorized representative who has indicated his/her understanding and acceptance.   Dental advisory given  Plan Discussed with: CRNA  Anesthesia Plan Comments: (Increased WBC. Plan general.)      Anesthesia Quick Evaluation

## 2014-12-14 NOTE — Progress Notes (Signed)
ANTIBIOTIC CONSULT NOTE - Follow up  Pharmacy Consult for Vancomycin Indication: MRSA bacteremia, septic hip  No Known Allergies  Patient Measurements: Height:  (175.3 cm) Weight: 170 lb (77.111 kg) IBW/kg (Calculated) : 70.7  Vital Signs: Temp: 99.8 F (37.7 C) (07/25 0455) Temp Source: Oral (07/25 0455) BP: 128/73 mmHg (07/25 0455) Pulse Rate: 102 (07/25 0455) Intake/Output from previous day: 07/24 0701 - 07/25 0700 In: 2033.8 [P.O.:720; I.V.:1063.8; IV Piggyback:250] Out: 500 [Urine:500] Intake/Output from this shift:    Labs:  Recent Labs  12/11/14 1520 12/12/14 0525 12/14/14 0420  WBC 13.6* 12.2* 14.8*  HGB 8.6* 8.5* 8.0*  PLT 480* 486* 472*  CREATININE 1.08 0.96 0.80   Estimated Creatinine Clearance: 108 mL/min (by C-G formula based on Cr of 0.8). No results for input(s): VANCOTROUGH, VANCOPEAK, VANCORANDOM, GENTTROUGH, GENTPEAK, GENTRANDOM, TOBRATROUGH, TOBRAPEAK, TOBRARND, AMIKACINPEAK, AMIKACINTROU, AMIKACIN in the last 72 hours.   Microbiology: Recent Results (from the past 720 hour(s))  Urine culture     Status: None   Collection Time: 11/16/14 12:11 PM  Result Value Ref Range Status   Specimen Description URINE, CLEAN CATCH  Final   Special Requests NONE  Final   Culture   Final    MULTIPLE SPECIES PRESENT, SUGGEST RECOLLECTION IF CLINICALLY INDICATED Performed at [facility]    Report Status 11/18/2014 FINAL  Final  Culture, blood (routine x 2) Call MD if unable to obtain prior to antibiotics being given     Status: None   Collection Time: 11/21/14 10:41 AM  Result Value Ref Range Status   Specimen Description BLOOD ARM LEFT  Final   Special Requests   Final    BOTTLES DRAWN AEROBIC AND ANAEROBIC 10CC BOTH BOTTLES   Culture  Setup Time   Final    GRAM POSITIVE COCCI IN CLUSTERS Gram Stain Report Called to,Read Back By and Verified With: M. Long RN 17:50 11/24/14 ([email or web address]) ANAEROBIC BOTTLE ONLY    Culture   Final    METHICILLIN  RESISTANT STAPHYLOCOCCUS AUREUS Performed at [facility]    Report Status 11/27/2014 FINAL  Final   Organism ID, Bacteria METHICILLIN RESISTANT STAPHYLOCOCCUS AUREUS  Final      Susceptibility   Methicillin resistant staphylococcus aureus - MIC*    CIPROFLOXACIN >=8 RESISTANT Resistant     ERYTHROMYCIN >=8 RESISTANT Resistant     GENTAMICIN <=0.5 SENSITIVE Sensitive     OXACILLIN >=4 RESISTANT Resistant     TETRACYCLINE <=1 SENSITIVE Sensitive     VANCOMYCIN 1 SENSITIVE Sensitive     TRIMETH/SULFA <=10 SENSITIVE Sensitive     CLINDAMYCIN <=0.25 SENSITIVE Sensitive     RIFAMPIN <=0.5 SENSITIVE Sensitive     Inducible Clindamycin NEGATIVE Sensitive     * METHICILLIN RESISTANT STAPHYLOCOCCUS AUREUS  Culture, blood (routine x 2) Call MD if unable to obtain prior to antibiotics being given     Status: None   Collection Time: 11/21/14 10:49 AM  Result Value Ref Range Status   Specimen Description BLOOD RIGHT ARM  Final   Special Requests   Final    BOTTLES DRAWN AEROBIC AND ANAEROBIC 10CC BOTH BOTTLES   Culture  Setup Time   Final    GRAM POSITIVE COCCI IN CLUSTERS ANAEROBIC BOTTLE ONLY CRITICAL RESULT CALLED TO, READ BACK BY AND VERIFIED WITH: A DEVINE,MD AT 1610 11/24/14 BY L BENFIELD    Culture   Final    METHICILLIN RESISTANT STAPHYLOCOCCUS AUREUS SUSCEPTIBILITIES PERFORMED ON PREVIOUS CULTURE WITHIN THE LAST 5 DAYS. Performed  at [facility]    Report Status 11/27/2014 FINAL  Final  Blood culture (routine x 2)     Status: None   Collection Time: 11/30/14  8:56 PM  Result Value Ref Range Status   Specimen Description BLOOD RIGHT HAND  Final   Special Requests BOTTLES DRAWN AEROBIC AND ANAEROBIC 10CC EA  Final   Culture   Final    NO GROWTH 5 DAYS Performed at [facility]    Report Status 12/05/2014 FINAL  Final  Blood culture (routine x 2)     Status: None   Collection Time: 11/30/14  9:19 PM  Result Value Ref Range Status   Specimen Description  BLOOD LEFT ANTECUBITAL  Final   Special Requests BOTTLES DRAWN AEROBIC AND ANAEROBIC 5CC EA  Final   Culture   Final    NO GROWTH 5 DAYS Performed at [facility]    Report Status 12/05/2014 FINAL  Final  Urine culture     Status: None   Collection Time: 11/30/14  9:57 PM  Result Value Ref Range Status   Specimen Description URINE, CATHETERIZED  Final   Special Requests NONE  Final   Culture   Final    NO GROWTH 2 DAYS Performed at [facility]    Report Status 12/02/2014 FINAL  Final  Culture, bal-quantitative     Status: None   Collection Time: 12/03/14  9:29 AM  Result Value Ref Range Status   Specimen Description BRONCHIAL ALVEOLAR LAVAGE  Final   Special Requests NONE  Final   Gram Stain   Final    RARE WBC PRESENT, PREDOMINANTLY MONONUCLEAR RARE SQUAMOUS EPITHELIAL CELLS PRESENT FEW GRAM POSITIVE COCCI IN PAIRS IN CLUSTERS RARE GRAM NEGATIVE RODS Performed at [facility]   Final    75,000 COLONIES/ML Performed at [facility]    Culture   Final    Non-Pathogenic Oropharyngeal-type Flora Isolated. Performed at [facility]    Report Status 12/06/2014 FINAL  Final  AFB culture with smear     Status: None (Preliminary result)   Collection Time: 12/03/14  9:29 AM  Result Value Ref Range Status   Specimen Description BRONCHIAL ALVEOLAR LAVAGE  Final   Special Requests NONE  Final   Acid Fast Smear   Final    NO ACID FAST BACILLI SEEN Performed at [facility]    Culture   Final    CULTURE WILL BE EXAMINED FOR 6 WEEKS BEFORE ISSUING A FINAL REPORT Performed at [facility]    Report Status PENDING  Incomplete  Fungus Culture with Smear     Status: None (Preliminary result)   Collection Time: 12/03/14  9:29 AM  Result Value Ref Range Status   Specimen Description BRONCHIAL ALVEOLAR LAVAGE  Final   Special Requests NONE  Final   Fungal Smear   Final    NO YEAST OR FUNGAL ELEMENTS  SEEN Performed at [facility]    Culture   Final    CANDIDA ALBICANS Performed at [facility]    Report Status PENDING  Incomplete  Blood culture (routine x 2)     Status: None   Collection Time: 12/08/14  6:09 PM  Result Value Ref Range Status   Specimen Description BLOOD LEFT HAND  Final   Special Requests BOTTLES DRAWN AEROBIC AND ANAEROBIC 5CC EACH  Final   Culture   Final    NO GROWTH 5  DAYS Performed at [facility]    Report Status 12/13/2014 FINAL  Final  Blood culture (routine x 2)     Status: None   Collection Time: 12/08/14  6:30 PM  Result Value Ref Range Status   Specimen Description BLOOD L HAND  Final   Special Requests BOTTLES DRAWN AEROBIC AND ANAEROBIC 5CC  Final   Culture   Final    NO GROWTH 5 DAYS Performed at [facility]    Report Status 12/13/2014 FINAL  Final  Blood culture (routine x 2)     Status: None (Preliminary result)   Collection Time: 12/11/14  3:15 PM  Result Value Ref Range Status   Specimen Description BLOOD RIGHT ANTECUBITAL  Final   Special Requests BOTTLES DRAWN AEROBIC AND ANAEROBIC 5CC  Final   Culture   Final    NO GROWTH 2 DAYS Performed at [facility]    Report Status PENDING  Incomplete  Blood culture (routine x 2)     Status: None (Preliminary result)   Collection Time: 12/11/14  3:22 PM  Result Value Ref Range Status   Specimen Description BLOOD RIGHT HAND  Final   Special Requests BOTTLES DRAWN AEROBIC AND ANAEROBIC 4CC  Final   Culture   Final    NO GROWTH 2 DAYS Performed at [facility]    Report Status PENDING  Incomplete  Body fluid culture     Status: None (Preliminary result)   Collection Time: 12/11/14 10:51 PM  Result Value Ref Range Status   Specimen Description SYNOVIAL  Final   Special Requests NONE  Final   Gram Stain   Final    WBC PRESENT, PREDOMINANTLY PMN NO ORGANISMS SEEN    Culture   Final    NO GROWTH 1 DAY Performed at [facility]     Report Status PENDING  Incomplete  Surgical pcr screen     Status: Abnormal   Collection Time: 12/14/14  6:40 AM  Result Value Ref Range Status   MRSA, PCR POSITIVE (A) NEGATIVE Final   Staphylococcus aureus POSITIVE (A) NEGATIVE Final    Comment:        The Xpert SA Assay (FDA approved for NASAL specimens in patients over 61 years of age), is one component of a comprehensive surveillance program.  Test performance has been validated by [facility] for patients greater than or equal to 44 year old. It is not intended to diagnose infection nor to guide or monitor treatment. CRITICAL RESULT CALLED TO, READ BACK BY AND VERIFIED WITH: S.WHITTMORE AT 0820 ON 12/14/14 BY S.VANHOORNE     Medical History: Past Medical History  Diagnosis Date  . Diabetes mellitus without complication   . Hypertension   . Endocarditis     Medications:  Anti-infectives    Start     Dose/Rate Route Frequency Ordered Stop   12/12/14 1600  vancomycin (VANCOCIN) 1,250 mg in sodium chloride 0.9 % 250 mL IVPB     1,250 mg 166.7 mL/hr over 90 Minutes Intravenous Every 12 hours 12/12/14 1404     12/12/14 0600  vancomycin (VANCOCIN) IVPB 1000 mg/200 mL premix  Status:  Discontinued     1,000 mg 200 mL/hr over 60 Minutes Intravenous Every 12 hours 12/12/14 0315 12/12/14 1404   12/11/14 1530  vancomycin (VANCOCIN) 1,500 mg in sodium chloride 0.9 % 500 mL IVPB     1,500 mg 250 mL/hr over 120 Minutes Intravenous  Once 12/11/14 1500 12/11/14 1809  Assessment: 55 y.o. male recently admitted 7/19-7/21, complains of increasing left hip pain. 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 The plan was for placement at a rehab facility to continue IV antibiotics but he left AMA on 7/21. Pharmacy is asked to resume Vancomycin dosing.  7/19 >> Vanc >> 7/21, 7/23 >>  7/2 blood x2: 2 of 2 MRSA 7/11  blood x2: NGF 7/19 blood x2: ngtd 7/22 blood x2: ngtd   7/21: Vanc trough = 20 on 1500mg  q12h, in target range but drawn before reaching steady-state so it will likely accumulate. 7/25: VT at 15:30 = ____ prior to 5th dose 1250mg  q12h  Today, 12/14/2014: Tm 100.4, WBCs elevated and rising. SCr improved to wnl, CrCl ~100 ml/min. To OR this evening for plan resection of left hip joint and placement of antibiotic laden cemented total hip followed by 6 weeks of IV antibiotics.  Goal of Therapy:  Vancomycin trough level 15-20 mcg/ml  Appropriate antibiotic dosing for renal function; eradication of infection  Plan:   Continue vancomycin 1250mg  IV q12h  Check trough today at 15:30 prior to 5th dose  Loralee Pacas, PharmD, BCPS Pager: [phone] 12/14/2014,9:25 AM.

## 2014-12-14 NOTE — Anesthesia Procedure Notes (Signed)
Procedure Name: Intubation Date/Time: 12/14/2014 3:52 PM Performed by: Doran Clay Pre-anesthesia Checklist: Patient identified, Timeout performed, Emergency Drugs available, Suction available and Patient being monitored Patient Re-evaluated:Patient Re-evaluated prior to inductionOxygen Delivery Method: Circle system utilized Preoxygenation: Pre-oxygenation with 100% oxygen Intubation Type: IV induction Laryngoscope Size: Mac and 4 Grade View: Grade I Tube type: Oral Tube size: 7.5 mm Number of attempts: 1 Airway Equipment and Method: Stylet Placement Confirmation: ETT inserted through vocal cords under direct vision,  breath sounds checked- equal and bilateral and positive ETCO2 Secured at: 23 cm Tube secured with: Tape Dental Injury: Teeth and Oropharynx as per pre-operative assessment

## 2014-12-14 NOTE — Interval H&P Note (Signed)
History and Physical Interval Note:  12/14/2014 2:42 PM  Marc [name]  has presented today for surgery, with the diagnosis of infected left hip  The various methods of treatment have been discussed with the patient and family. After consideration of risks, benefits and other options for treatment, the patient has consented to  Procedure(s): Left hip resection with prostalac  (Left) as a surgical intervention .  The patient's history has been reviewed, patient examined, no change in status, stable for surgery.  I have reviewed the patient's chart and labs.  Questions were answered to the patient's satisfaction.     [name]

## 2014-12-14 NOTE — Brief Op Note (Signed)
12/11/2014 - 12/14/2014  6:00 PM  PATIENT:  Marc [name]  54 y.o. male  PRE-OPERATIVE DIAGNOSIS:  Septic arthritis left hip  POST-OPERATIVE DIAGNOSIS: Septic arthritis left hip  PROCEDURE:  Procedure(s): Resection left hip joint  Placement of temporary antibiotic laden cemented total hip replacement  SURGEON:  Surgeon(s) and Role:    * Durene Romans, MD - Primary  PHYSICIAN ASSISTANT: Lanney Gins, PA-C  ANESTHESIA:   general  EBL:  Total I/O In: 1578.8 [I.V.:1278.8; Blood:300] Out: 1800 [Urine:1550; Blood:250]  BLOOD ADMINISTERED:350 CC PRBC  DRAINS: none   LOCAL MEDICATIONS USED:  NONE  SPECIMEN:  Source of Specimen:  left hip joint fluid, left femoral head  DISPOSITION OF SPECIMEN:  PATHOLOGY  COUNTS:  YES  TOURNIQUET:  * No tourniquets in log *  DICTATION: .Other Dictation: Dictation Number [id number]  PLAN OF CARE: Admit to inpatient   PATIENT DISPOSITION:  PACU - hemodynamically stable.   Delay start of Pharmacological VTE agent (>24hrs) due to surgical blood loss or risk of bleeding: no

## 2014-12-14 NOTE — Care Management Note (Signed)
Case Management Note  Patient Details  Name: DORIAN DUVAL MRN: [id number] Date of Birth: 1961/05/10  Subjective/Objective: 54 y/o m admitted w/Septic arthritis l hip. Readmit-left AMA-endocarditis. From home w/no available caregiver support.s/p L hip resection w/placement for long term iv abx.                   Action/Plan:d/c plan SNF   Expected Discharge Date:   (unknown)               Expected Discharge Plan:  Skilled Nursing Facility  In-House Referral:  Clinical Social Work  Discharge planning Services  CM Consult  Post Acute Care Choice:    Choice offered to:     DME Arranged:    DME Agency:     HH Arranged:    HH Agency:     Status of Service:     Medicare Important Message Given:    Date Medicare IM Given:    Medicare IM give by:    Date Additional Medicare IM Given:    Additional Medicare Important Message give by:     If discussed at Long Length of Stay Meetings, dates discussed:    Additional Comments:  Lanier Clam, RN 12/14/2014, 8:52 PM

## 2014-12-15 ENCOUNTER — Encounter (HOSPITAL_COMMUNITY): Payer: Self-pay | Admitting: Orthopedic Surgery

## 2014-12-15 LAB — TYPE AND SCREEN
ABO/RH(D): A POS
Antibody Screen: NEGATIVE
Unit division: 0
Unit division: 0

## 2014-12-15 LAB — GLUCOSE, CAPILLARY
GLUCOSE-CAPILLARY: 210 mg/dL — AB (ref 65–99)
Glucose-Capillary: 220 mg/dL — ABNORMAL HIGH (ref 65–99)
Glucose-Capillary: 95 mg/dL (ref 65–99)
Glucose-Capillary: 133 mg/dL — ABNORMAL HIGH (ref 65–99)

## 2014-12-15 LAB — CBC
HCT: 27.6 % — ABNORMAL LOW (ref 39.0–52.0)
Hemoglobin: 8.8 g/dL — ABNORMAL LOW (ref 13.0–17.0)
MCH: 26 pg (ref 26.0–34.0)
MCHC: 31.9 g/dL (ref 30.0–36.0)
MCV: 81.7 fL (ref 78.0–100.0)
PLATELETS: 384 10*3/uL (ref 150–400)
RBC: 3.38 MIL/uL — ABNORMAL LOW (ref 4.22–5.81)
RDW: 15.1 % (ref 11.5–15.5)
WBC: 13 10*3/uL — ABNORMAL HIGH (ref 4.0–10.5)

## 2014-12-15 LAB — BASIC METABOLIC PANEL
ANION GAP: 7 (ref 5–15)
BUN: 12 mg/dL (ref 6–20)
CALCIUM: 8.2 mg/dL — AB (ref 8.9–10.3)
CO2: 27 mmol/L (ref 22–32)
Chloride: 103 mmol/L (ref 101–111)
Creatinine, Ser: 0.98 mg/dL (ref 0.61–1.24)
GFR calc Af Amer: >60 mL/min (ref >60)
Glucose, Bld: 288 mg/dL — ABNORMAL HIGH (ref 65–99)
Potassium: 4.3 mmol/L (ref 3.5–5.1)
Sodium: 137 mmol/L (ref 135–145)

## 2014-12-15 LAB — BODY FLUID CULTURE: Culture: NO GROWTH

## 2014-12-15 LAB — VANCOMYCIN, TROUGH: VANCOMYCIN TR: 15 ug/mL (ref 10.0–20.0)

## 2014-12-15 MED ORDER — CHLORHEXIDINE GLUCONATE CLOTH 2 % EX PADS
6.0000 | MEDICATED_PAD | Freq: Every day | CUTANEOUS | Status: DC
  Administered 2014-12-15 – 2014-12-17 (×3): 6 via TOPICAL

## 2014-12-15 MED ORDER — MUPIROCIN 2 % EX OINT
1.0000 "application " | TOPICAL_OINTMENT | Freq: Two times a day (BID) | CUTANEOUS | Status: DC
  Administered 2014-12-15 – 2014-12-18 (×7): 1 via NASAL
  Filled 2014-12-15: qty 22

## 2014-12-15 NOTE — Evaluation (Addendum)
Physical Therapy Evaluation Patient Details Name: Marc [name] MRN: [id number] DOB: [birth year] Today's Date: 12/15/2014   History of Present Illness  Marc [name] is a 54 y.o. male with a history of Uncontrolled DM2, and Medical Noncompliance who had been found to have Presumed Septic Emboli and an MRSA bacteremia and  left AMA on 12/10/2014, and returned to the ED 12/11/14  with complaints of increased left hip pain and difficulty walking due to the pain x 3 days. 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 An I+D was performed in the ED, and he was placed on IV Vancomycin and Orhtopedics. S/P placement of antibiotic laden cemented THR  Clinical Impression  Patient requires extra time for mobility and extensive assist of 2 persons. Patient did really put forth effort to mobilize. Patient will benefit from PT to address problems listed in note below.    Follow Up Recommendations SNF;Supervision/Assistance - 24 hour    Equipment Recommendations  Rolling walker with 5" wheels    Recommendations for Other Services       Precautions / Restrictions Precautions Precautions: Fall;Posterior Hip Restrictions Weight Bearing Restrictions: Yes LLE Weight Bearing: Partial weight bearing LLE Partial Weight Bearing Percentage or Pounds: 50 Other Position/Activity Restrictions: pwb      Mobility  Bed Mobility Overal bed mobility: Needs Assistance Bed Mobility: Supine to Sit;Sit to Supine     Supine to sit: Max assist;+2 for physical assistance Sit to supine: Max assist;+2 for physical assistance   General bed mobility comments: assist for LLE and trunk; pt used tried to use trapeze to reposition, much extra  time to mobilize, total assist for LLE onto bed.  Transfers Overall transfer level: Needs assistance Equipment used: Rolling walker (2 wheeled) Transfers: Sit to/from Stand Sit to Stand: Mod  assist;+2 physical assistance;From elevated surface         General transfer comment: assist to rise and stabilize.  Slow transition; cues for UE/LE placement, extra time to lower to the bed.  Ambulation/Gait Ambulation/Gait assistance: Mod assist;+2 physical assistance;+2 safety/equipment Ambulation Distance (Feet): 10 Feet Assistive device: Rolling walker (2 wheeled) Gait Pattern/deviations: Step-to pattern     General Gait Details: required assist to advance the L eft leg every step, much extra time to ambulate, cues for posture.  Stairs            Wheelchair Mobility    Modified Rankin (Stroke Patients Only)       Balance                                             Pertinent Vitals/Pain Pain Assessment: 0-10 Pain Score: 8  Faces Pain Scale: Hurts whole lot Pain Location: when leg is moved toward adduction and internal rotation toward neutral. Pain Descriptors / Indicators: Aching;Tightness;Sharp Pain Intervention(s): Limited activity within patient's tolerance;Premedicated before session    Home Living Family/patient expects to be discharged to:: Skilled nursing facility                      Prior Function Level of Independence: Independent               Hand Dominance        Extremity/Trunk Assessment   Upper Extremity Assessment: Overall WFL for tasks assessed  Lower Extremity Assessment: LLE deficits/detail   LLE Deficits / Details: L leg is positioned in ER and flexed, requires assist to advance L leg  during ambulation.     Communication   Communication: No difficulties  Cognition Arousal/Alertness: Awake/alert Behavior During Therapy: WFL for tasks assessed/performed;Anxious Overall Cognitive Status: No family/caregiver present to determine baseline cognitive functioning Area of Impairment: Awareness;Problem solving               General Comments: slow to process    General Comments       Exercises        Assessment/Plan    PT Assessment Patient needs continued PT services  PT Diagnosis Difficulty walking;Acute pain   PT Problem List Decreased strength;Decreased range of motion;Decreased activity tolerance;Decreased mobility;Decreased knowledge of precautions;Decreased safety awareness;Decreased knowledge of use of DME;Pain  PT Treatment Interventions DME instruction;Gait training;Functional mobility training;Therapeutic activities;Therapeutic exercise;Patient/family education   PT Goals (Current goals can be found in the Care Plan section) Acute Rehab PT Goals Patient Stated Goal: get back to being independent PT Goal Formulation: With patient Time For Goal Achievement: 12/29/14 Potential to Achieve Goals: Fair    Frequency Min 3X/week   Barriers to discharge Decreased caregiver support      Co-evaluation PT/OT/SLP Co-Evaluation/Treatment: Yes Reason for Co-Treatment: Complexity of the patient's impairments (multi-system involvement) PT goals addressed during session: Mobility/safety with mobility OT goals addressed during session: ADL's and self-care       End of Session Equipment Utilized During Treatment: Gait belt   Patient left: with bed alarm set;with call bell/phone within reach Nurse Communication: Mobility status         Time: 1610-9604 PT Time Calculation (min) (ACUTE ONLY): 32 min   Charges:   PT Evaluation $Initial PT Evaluation Tier I: 1 Procedure     PT G CodesRada Hay 12/15/2014, 12:55 PM Blanchard Kelch PT [phone]

## 2014-12-15 NOTE — Progress Notes (Addendum)
[facility] PROGRESS NOTE  Marc [name] ZOX:[id number] DOB: [birth year] DOA: 12/11/2014 PCP: No PCP Per Patient  Assessment/Plan: 54 year old gentleman with h/o uncontrolled DM, non compliance and MRSA bacteremia, Endocarditis supposed to be on 6 weeks of antibiotics was discharged to rehab but left AMA from rehab center with foley catheter, presents to ED with worsening left hip pain.  MRI hip shows worsening of the left hip with septic arthritis. Orthopedics consutled and UNDERWENT I&D of the joint and resenction of the left hip joint with placement of temporary antibiotic laden cemented THR.  Resume IV antibiotics and pain control.   Endocarditis:  AS per discharge summary from 7/16 , he has to complete 6 weeks of IV vancomycin through 8/21 2016. Unclear how many days of IV antibiotics he missed over the last 3 weeks. Would continue the antibiotics atleast till 8/21 plus another 2 weeks. Please follow up with ID on discharge.     Uncontrolled Diabetes mellitus: hgba1c 12.1 - resume SSI.  CBG (last 3)   Recent Labs  12/15/14 0723 12/15/14 1305 12/15/14 1644  GLUCAP 220* 210* 95       Anemia: normocytic, probably from anemia of chronic disease.   Hypertension; better controlled.    Code Status: FULL CODE  Family Communication: none at bedside.  Disposition Plan: SNF in am.    Consultants:  orthopedics  Procedures:  none  Antibiotics:  Vancomycin. 7/23  HPI/Subjective: Reports pain controlled. But unable to move leg.  Objective: Filed Vitals:   12/15/14 1339  BP: 134/71  Pulse: 101  Temp: 98.6 F (37 C)  Resp: 16    Intake/Output Summary (Last 24 hours) at 12/15/14 1946 Last data filed at 12/15/14 1915  Gross per 24 hour  Intake 1966.67 ml  Output   2550 ml  Net -583.33 ml   Filed Weights   12/12/14 0400  Weight: 77.111 kg (170 lb)    Exam:   General:  Alert afebrile comfortable  Cardiovascular: s1s2  Respiratory:  ctab  Abdomen: soft NT nd bs+  Musculoskeletal: no pedal edema.   Data Reviewed: Basic Metabolic Panel:  Recent Labs Lab 12/09/14 0500 12/11/14 1520 12/12/14 0525 12/14/14 0420 12/15/14 0250  NA 138 133* 134* 140 137  K 3.4* 4.0 4.1 3.6 4.3  CL 100* 101 102 105 103  CO2 30 26 27 28 27   GLUCOSE 125* 331* 264* 81 288*  BUN 8 12 10 10 12   CREATININE 0.86 1.08 0.96 0.80 0.98  CALCIUM 8.5* 8.3* 8.4* 8.4* 8.2*   Liver Function Tests: No results for input(s): AST, ALT, ALKPHOS, BILITOT, PROT, ALBUMIN in the last 168 hours. No results for input(s): LIPASE, AMYLASE in the last 168 hours. No results for input(s): AMMONIA in the last 168 hours. CBC:  Recent Labs Lab 12/09/14 0500 12/11/14 1520 12/12/14 0525 12/14/14 0420 12/15/14 0250  WBC 9.9 13.6* 12.2* 14.8* 13.0*  NEUTROABS  --  11.0*  --  11.8*  --   HGB 8.3* 8.6* 8.5* 8.0* 8.8*  HCT 26.2* 28.0* 27.3* 25.6* 27.6*  MCV 82.1 80.7 81.0 80.0 81.7  PLT 459* 480* 486* 472* 384   Cardiac Enzymes: No results for input(s): CKTOTAL, CKMB, CKMBINDEX, TROPONINI in the last 168 hours. BNP (last 3 results) No results for input(s): BNP in the last 8760 hours.  ProBNP (last 3 results) No results for input(s): PROBNP in the last 8760 hours.  CBG:  Recent Labs Lab 12/14/14 1748 12/14/14 2155 12/15/14 0723 12/15/14 1305 12/15/14 1644  GLUCAP 84 195* 220* 210* 95    Recent Results (from the past 240 hour(s))  Blood culture (routine x 2)     Status: None   Collection Time: 12/08/14  6:09 PM  Result Value Ref Range Status   Specimen Description BLOOD LEFT HAND  Final   Special Requests BOTTLES DRAWN AEROBIC AND ANAEROBIC 5CC EACH  Final   Culture   Final    NO GROWTH 5 DAYS Performed at [facility]    Report Status 12/13/2014 FINAL  Final  Blood culture (routine x 2)     Status: None   Collection Time: 12/08/14  6:30 PM  Result Value Ref Range Status   Specimen Description BLOOD L HAND  Final   Special  Requests BOTTLES DRAWN AEROBIC AND ANAEROBIC 5CC  Final   Culture   Final    NO GROWTH 5 DAYS Performed at [facility]    Report Status 12/13/2014 FINAL  Final  Blood culture (routine x 2)     Status: None (Preliminary result)   Collection Time: 12/11/14  3:15 PM  Result Value Ref Range Status   Specimen Description BLOOD RIGHT ANTECUBITAL  Final   Special Requests BOTTLES DRAWN AEROBIC AND ANAEROBIC 5CC  Final   Culture   Final    NO GROWTH 4 DAYS Performed at [facility]    Report Status PENDING  Incomplete  Blood culture (routine x 2)     Status: None (Preliminary result)   Collection Time: 12/11/14  3:22 PM  Result Value Ref Range Status   Specimen Description BLOOD RIGHT HAND  Final   Special Requests BOTTLES DRAWN AEROBIC AND ANAEROBIC 4CC  Final   Culture   Final    NO GROWTH 4 DAYS Performed at [facility] [place]    Report Status PENDING  Incomplete  Body fluid culture     Status: None   Collection Time: 12/11/14 10:51 PM  Result Value Ref Range Status   Specimen Description SYNOVIAL  Final   Special Requests NONE  Final   Gram Stain   Final    WBC PRESENT, PREDOMINANTLY PMN NO ORGANISMS SEEN    Culture   Final    NO GROWTH 3 DAYS Performed at [facility]    Report Status 12/15/2014 FINAL  Final  Surgical pcr screen     Status: Abnormal   Collection Time: 12/14/14  6:40 AM  Result Value Ref Range Status   MRSA, PCR POSITIVE (A) NEGATIVE Final   Staphylococcus aureus POSITIVE (A) NEGATIVE Final    Comment:        The Xpert SA Assay (FDA approved for NASAL specimens in patients over 50 years of age), is one component of a comprehensive surveillance program.  Test performance has been validated by [facility] for patients greater than or equal to 18 year old. It is not intended to diagnose infection nor to guide or monitor treatment. CRITICAL RESULT CALLED TO, READ BACK BY AND VERIFIED WITH: S.WHITTMORE AT 0820 ON 12/14/14 BY  S.VANHOORNE   Anaerobic culture     Status: None (Preliminary result)   Collection Time: 12/14/14  4:31 PM  Result Value Ref Range Status   Specimen Description WOUND  Final   Special Requests NONE  Final   Gram Stain   Final    ABUNDANT WBC PRESENT,BOTH PMN AND MONONUCLEAR NO ORGANISMS SEEN    Culture   Final    NO ANAEROBES ISOLATED; CULTURE IN PROGRESS FOR 5  DAYS Performed at [facility]    Report Status PENDING  Incomplete  Body fluid culture     Status: None (Preliminary result)   Collection Time: 12/14/14  4:31 PM  Result Value Ref Range Status   Specimen Description   Final    HIP LEFT        Lymphocytosis. If a lymphoproliferative disorder is in the clinical differential diagnosis, fresh unrefrigerated    Special Requests PATIENT ON FOLLOWING VANCOMYCIN  Final   Gram Stain   Final    ABUNDANT WBC PRESENT,BOTH PMN AND MONONUCLEAR NO ORGANISMS SEEN    Culture   Final    NO GROWTH < 24 HOURS Performed at [facility]    Report Status PENDING  Incomplete  Tissue culture     Status: None (Preliminary result)   Collection Time: 12/14/14  5:04 PM  Result Value Ref Range Status   Specimen Description HIP LEFT FEMORAL HEAD  Final   Special Requests PATIENT ON FOLLOWING VANCOMYCIN  Final   Gram Stain   Final    RARE WBC PRESENT,BOTH PMN AND MONONUCLEAR NO ORGANISMS SEEN Performed at [facility]    Culture PENDING  Incomplete   Report Status PENDING  Incomplete  Anaerobic culture     Status: None (Preliminary result)   Collection Time: 12/14/14  5:17 PM  Result Value Ref Range Status   Specimen Description HIP LEFT FEMORAL HEAD  Final   Special Requests NONE  Final   Gram Stain   Final    FEW WBC PRESENT,BOTH PMN AND MONONUCLEAR NO ORGANISMS SEEN Performed at [facility]    Culture   Final    NO ANAEROBES ISOLATED; CULTURE IN PROGRESS FOR 5 DAYS Performed at [facility]    Report Status PENDING  Incomplete      Studies: No results found.  Scheduled Meds: . amLODipine  10 mg Oral Daily  . aspirin EC  325 mg Oral BID  . celecoxib  200 mg Oral Q12H  . Chlorhexidine Gluconate Cloth  6 each Topical Q0600  . dexamethasone  10 mg Intravenous Once  . docusate sodium  100 mg Oral BID  . ferrous sulfate  325 mg Oral TID PC  . folic acid  1 mg Oral Daily  . glimepiride  2 mg Oral Q breakfast  . insulin aspart  0-15 Units Subcutaneous TID WC  . insulin glargine  35 Units Subcutaneous QHS  . lisinopril  5 mg Oral Daily  . multivitamin with minerals  1 tablet Oral Daily  . mupirocin ointment  1 application Nasal BID  . pantoprazole  40 mg Oral Daily  . polyethylene glycol  17 g Oral BID  . sodium chloride  10-40 mL Intracatheter Q12H  . vancomycin  1,250 mg Intravenous Q12H   Continuous Infusions: . dextrose 5 % and 0.9% NaCl 75 mL/hr at 12/14/14 1029  . sodium chloride 0.9 % 1,000 mL with potassium chloride 10 mEq infusion 100 mL/hr (12/14/14 2114)    Principal Problem:   Endocarditis Active Problems:   Essential hypertension   Non-compliance with treatment   Septic arthritis   Uncontrolled diabetes mellitus   Anemia   Hip pain    Time spent: 25 minutes     Kendyl Bissonnette  [facility] Pager [phone] . If 7PM-7AM, please contact night-coverage at [email or web address], password [facility] 12/15/2014, 7:46 PM  LOS: 3 days

## 2014-12-15 NOTE — Progress Notes (Signed)
CSW met with pt this am to assist with d/c planning. Updated clinicals sent to [facility]. It's unclear, at this time, if they are able to offer placement.  PT Eval will be sent to SNF once completed. A decision will be made once SNF reviews PT recommendations. [facility] will provide LOG for placement. Pt has been updated. CSW will continue to follow to assist with d/c planning needs.  Werner Lean LCSW [phone]

## 2014-12-15 NOTE — Progress Notes (Signed)
ANTIBIOTIC CONSULT NOTE - FOLLOW UP  Pharmacy Consult for Vancomycin Indication: MRSA bacteremia, septic hip  No Known Allergies  Patient Measurements: Height:  (175.3 cm) Weight: 170 lb (77.111 kg) IBW/kg (Calculated) : 70.7 Adjusted Body Weight:   Vital Signs: Temp: 98.6 F (37 C) (07/26 0446) Temp Source: Axillary (07/26 0446) BP: 141/79 mmHg (07/26 0446) Pulse Rate: 96 (07/26 0446) Intake/Output from previous day: 07/25 0701 - 07/26 0700 In: 2353.8 [P.O.:360; I.V.:1693.8; Blood:300] Out: 3800 [Urine:3550; Blood:250] Intake/Output from this shift: Total I/O In: 360 [P.O.:360] Out: 1800 [Urine:1800]  Labs:  Recent Labs  12/14/14 0420 12/15/14 0250  WBC 14.8* 13.0*  HGB 8.0* 8.8*  PLT 472* 384  CREATININE 0.80 0.98   Estimated Creatinine Clearance: 88.2 mL/min (by C-G formula based on Cr of 0.98).  Recent Labs  12/15/14 0250  [facility] 15     Microbiology: Recent Results (from the past 720 hour(s))  Urine culture     Status: None   Collection Time: 11/16/14 12:11 PM  Result Value Ref Range Status   Specimen Description URINE, CLEAN CATCH  Final   Special Requests NONE  Final   Culture   Final    MULTIPLE SPECIES PRESENT, SUGGEST RECOLLECTION IF CLINICALLY INDICATED Performed at [facility]    Report Status 11/18/2014 FINAL  Final  Culture, blood (routine x 2) Call MD if unable to obtain prior to antibiotics being given     Status: None   Collection Time: 11/21/14 10:41 AM  Result Value Ref Range Status   Specimen Description BLOOD ARM LEFT  Final   Special Requests   Final    BOTTLES DRAWN AEROBIC AND ANAEROBIC 10CC BOTH BOTTLES   Culture  Setup Time   Final    GRAM POSITIVE COCCI IN CLUSTERS Gram Stain Report Called to,Read Back By and Verified With: M. Long RN 17:50 11/24/14 ([email or web address]) ANAEROBIC BOTTLE ONLY    Culture   Final    METHICILLIN RESISTANT STAPHYLOCOCCUS AUREUS Performed at [facility]    Report Status  11/27/2014 FINAL  Final   Organism ID, Bacteria METHICILLIN RESISTANT STAPHYLOCOCCUS AUREUS  Final      Susceptibility   Methicillin resistant staphylococcus aureus - MIC*    CIPROFLOXACIN >=8 RESISTANT Resistant     ERYTHROMYCIN >=8 RESISTANT Resistant     GENTAMICIN <=0.5 SENSITIVE Sensitive     OXACILLIN >=4 RESISTANT Resistant     TETRACYCLINE <=1 SENSITIVE Sensitive     VANCOMYCIN 1 SENSITIVE Sensitive     TRIMETH/SULFA <=10 SENSITIVE Sensitive     CLINDAMYCIN <=0.25 SENSITIVE Sensitive     RIFAMPIN <=0.5 SENSITIVE Sensitive     Inducible Clindamycin NEGATIVE Sensitive     * METHICILLIN RESISTANT STAPHYLOCOCCUS AUREUS  Culture, blood (routine x 2) Call MD if unable to obtain prior to antibiotics being given     Status: None   Collection Time: 11/21/14 10:49 AM  Result Value Ref Range Status   Specimen Description BLOOD RIGHT ARM  Final   Special Requests   Final    BOTTLES DRAWN AEROBIC AND ANAEROBIC 10CC BOTH BOTTLES   Culture  Setup Time   Final    GRAM POSITIVE COCCI IN CLUSTERS ANAEROBIC BOTTLE ONLY CRITICAL RESULT CALLED TO, READ BACK BY AND VERIFIED WITH: A DEVINE,MD AT 1610 11/24/14 BY L BENFIELD    Culture   Final    METHICILLIN RESISTANT STAPHYLOCOCCUS AUREUS SUSCEPTIBILITIES PERFORMED ON PREVIOUS CULTURE WITHIN THE LAST 5 DAYS. Performed at [facility]  Report Status 11/27/2014 FINAL  Final  Blood culture (routine x 2)     Status: None   Collection Time: 11/30/14  8:56 PM  Result Value Ref Range Status   Specimen Description BLOOD RIGHT HAND  Final   Special Requests BOTTLES DRAWN AEROBIC AND ANAEROBIC 10CC EA  Final   Culture   Final    NO GROWTH 5 DAYS Performed at [facility]    Report Status 12/05/2014 FINAL  Final  Blood culture (routine x 2)     Status: None   Collection Time: 11/30/14  9:19 PM  Result Value Ref Range Status   Specimen Description BLOOD LEFT ANTECUBITAL  Final   Special Requests BOTTLES DRAWN AEROBIC AND ANAEROBIC 5CC  EA  Final   Culture   Final    NO GROWTH 5 DAYS Performed at [facility]    Report Status 12/05/2014 FINAL  Final  Urine culture     Status: None   Collection Time: 11/30/14  9:57 PM  Result Value Ref Range Status   Specimen Description URINE, CATHETERIZED  Final   Special Requests NONE  Final   Culture   Final    NO GROWTH 2 DAYS Performed at [facility]    Report Status 12/02/2014 FINAL  Final  Culture, bal-quantitative     Status: None   Collection Time: 12/03/14  9:29 AM  Result Value Ref Range Status   Specimen Description BRONCHIAL ALVEOLAR LAVAGE  Final   Special Requests NONE  Final   Gram Stain   Final    RARE WBC PRESENT, PREDOMINANTLY MONONUCLEAR RARE SQUAMOUS EPITHELIAL CELLS PRESENT FEW GRAM POSITIVE COCCI IN PAIRS IN CLUSTERS RARE GRAM NEGATIVE RODS Performed at [facility]   Final    75,000 COLONIES/ML Performed at [facility]    Culture   Final    Non-Pathogenic Oropharyngeal-type Flora Isolated. Performed at [facility]    Report Status 12/06/2014 FINAL  Final  AFB culture with smear     Status: None (Preliminary result)   Collection Time: 12/03/14  9:29 AM  Result Value Ref Range Status   Specimen Description BRONCHIAL ALVEOLAR LAVAGE  Final   Special Requests NONE  Final   Acid Fast Smear   Final    NO ACID FAST BACILLI SEEN Performed at [facility]    Culture   Final    CULTURE WILL BE EXAMINED FOR 6 WEEKS BEFORE ISSUING A FINAL REPORT Performed at [facility]    Report Status PENDING  Incomplete  Fungus Culture with Smear     Status: None (Preliminary result)   Collection Time: 12/03/14  9:29 AM  Result Value Ref Range Status   Specimen Description BRONCHIAL ALVEOLAR LAVAGE  Final   Special Requests NONE  Final   Fungal Smear   Final    NO YEAST OR FUNGAL ELEMENTS SEEN Performed at [facility]    Culture   Final    CANDIDA ALBICANS Performed at  [facility]    Report Status PENDING  Incomplete  Blood culture (routine x 2)     Status: None   Collection Time: 12/08/14  6:09 PM  Result Value Ref Range Status   Specimen Description BLOOD LEFT HAND  Final   Special Requests BOTTLES DRAWN AEROBIC AND ANAEROBIC 5CC EACH  Final   Culture   Final    NO GROWTH 5 DAYS Performed at [facility]  Report Status 12/13/2014 FINAL  Final  Blood culture (routine x 2)     Status: None   Collection Time: 12/08/14  6:30 PM  Result Value Ref Range Status   Specimen Description BLOOD L HAND  Final   Special Requests BOTTLES DRAWN AEROBIC AND ANAEROBIC 5CC  Final   Culture   Final    NO GROWTH 5 DAYS Performed at [facility]    Report Status 12/13/2014 FINAL  Final  Blood culture (routine x 2)     Status: None (Preliminary result)   Collection Time: 12/11/14  3:15 PM  Result Value Ref Range Status   Specimen Description BLOOD RIGHT ANTECUBITAL  Final   Special Requests BOTTLES DRAWN AEROBIC AND ANAEROBIC 5CC  Final   Culture   Final    NO GROWTH 3 DAYS Performed at [facility]    Report Status PENDING  Incomplete  Blood culture (routine x 2)     Status: None (Preliminary result)   Collection Time: 12/11/14  3:22 PM  Result Value Ref Range Status   Specimen Description BLOOD RIGHT HAND  Final   Special Requests BOTTLES DRAWN AEROBIC AND ANAEROBIC 4CC  Final   Culture   Final    NO GROWTH 3 DAYS Performed at [facility]    Report Status PENDING  Incomplete  Body fluid culture     Status: None (Preliminary result)   Collection Time: 12/11/14 10:51 PM  Result Value Ref Range Status   Specimen Description SYNOVIAL  Final   Special Requests NONE  Final   Gram Stain   Final    WBC PRESENT, PREDOMINANTLY PMN NO ORGANISMS SEEN    Culture   Final    NO GROWTH 2 DAYS Performed at [facility]    Report Status PENDING  Incomplete  Surgical pcr screen     Status: Abnormal   Collection  Time: 12/14/14  6:40 AM  Result Value Ref Range Status   MRSA, PCR POSITIVE (A) NEGATIVE Final   Staphylococcus aureus POSITIVE (A) NEGATIVE Final    Comment:        The Xpert SA Assay (FDA approved for NASAL specimens in patients over 82 years of age), is one component of a comprehensive surveillance program.  Test performance has been validated by [facility] for patients greater than or equal to 5 year old. It is not intended to diagnose infection nor to guide or monitor treatment. CRITICAL RESULT CALLED TO, READ BACK BY AND VERIFIED WITH: S.WHITTMORE AT 0820 ON 12/14/14 BY S.VANHOORNE   Anaerobic culture     Status: None (Preliminary result)   Collection Time: 12/14/14  4:31 PM  Result Value Ref Range Status   Specimen Description WOUND  Final   Special Requests NONE  Final   Gram Stain   Final    ABUNDANT WBC PRESENT,BOTH PMN AND MONONUCLEAR NO ORGANISMS SEEN Performed at [facility]    Culture PENDING  Incomplete   Report Status PENDING  Incomplete    Anti-infectives    Start     Dose/Rate Route Frequency Ordered Stop   12/14/14 2000  ceFAZolin (ANCEF) IVPB 2 g/50 mL premix     2 g 100 mL/hr over 30 Minutes Intravenous Every 6 hours 12/14/14 1930 12/15/14 0211   12/14/14 1657  tobramycin (NEBCIN) powder  Status:  Discontinued       As needed 12/14/14 1658 12/14/14 1742   12/14/14 1656  vancomycin (VANCOCIN) powder  Status:  Discontinued  As needed 12/14/14 1657 12/14/14 1742   12/12/14 1600  vancomycin (VANCOCIN) 1,250 mg in sodium chloride 0.9 % 250 mL IVPB     1,250 mg 166.7 mL/hr over 90 Minutes Intravenous Every 12 hours 12/12/14 1404     12/12/14 0600  vancomycin (VANCOCIN) IVPB 1000 mg/200 mL premix  Status:  Discontinued     1,000 mg 200 mL/hr over 60 Minutes Intravenous Every 12 hours 12/12/14 0315 12/12/14 1404   12/11/14 1530  vancomycin (VANCOCIN) 1,500 mg in sodium chloride 0.9 % 500 mL IVPB     1,500 mg 250 mL/hr over 120 Minutes  Intravenous  Once 12/11/14 1500 12/11/14 1809      Assessment: Patient with vancomycin level at goal.  Prior doses appear to be charted correctly.   Goal of Therapy:  Vancomycin trough level 15-20 mcg/ml  Plan:  Measure antibiotic drug levels at steady state Follow up culture results  Continue with current dosing for now, recheck levels at least weekly.  Darlina Guys, Jacquenette Shone Crowford 12/15/2014,6:09 AM

## 2014-12-15 NOTE — Evaluation (Signed)
Occupational Therapy Evaluation Patient Details Name: Marc [name] MRN: [id number] DOB: 1960/11/18 Today's Date: 12/15/2014    History of Present Illness Marc [name] is a 54 y.o. 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 An I+D was performed in the ED, and he was placed on IV Vancomycin and Orhtopedics. S/P placement of antibiotic laden cemented THR on L   Clinical Impression   Pt was admitted for the above. At baseline, he is mod I with adls.  He currently needs A x 2 for mobility (mod to max A) and LB adls (mod to total A).  He will benefit from skilled OT.  Goals in acute are for min A level, following posterior THPs and PWB on L    Follow Up Recommendations  SNF    Equipment Recommendations  3 in 1 bedside comode    Recommendations for Other Services       Precautions / Restrictions Precautions Precautions: Fall;Posterior Hip Restrictions Weight Bearing Restrictions: Yes LLE Weight Bearing: Partial weight bearing LLE Partial Weight Bearing Percentage or Pounds: 50 Other Position/Activity Restrictions: pwb      Mobility Bed Mobility   Bed Mobility: Supine to Sit;Sit to Supine     Supine to sit: Max assist;+2 for physical assistance Sit to supine: Max assist;+2 for physical assistance   General bed mobility comments: assist for LLE and trunk; pt used tried to use trapeze to reposition  Transfers Overall transfer level: Needs assistance Equipment used: Rolling walker (2 wheeled) Transfers: Sit to/from Stand Sit to  Stand: Mod assist;+2 physical assistance;From elevated surface         General transfer comment: assist to rise and stabilize.  Slow transition; cues for UE/LE placement    Balance                                            ADL Overall ADL's : Needs assistance/impaired     Grooming: Wash/dry hands;Wash/dry face;Set up;Sitting   Upper Body Bathing: Set up;Sitting   Lower Body Bathing: Moderate assistance;+2 for physical assistance;+2 for safety/equipment;Sit to/from stand   Upper Body Dressing : Minimal assistance;Sitting (picc)   Lower Body Dressing: Total assistance;+2 for physical assistance;Sit to/from stand   Toilet Transfer: Moderate assistance;+2 for physical assistance;Ambulation (ambulated around bed)   Toileting- Clothing Manipulation and Hygiene: Moderate assistance;+2 for physical assistance;Sit to/from stand         General ADL Comments: reviewed THPs; pt will need reinforcement.  Performed UB adl from EOB.  Introduced Teacher, adult education of AE but did not bring this session:  plan to issue AE kit on next visit  Pt needed assistance to advance LLE when ambulating.  Cued for PWB.  Pt could not assist with LLE during bed mobility and this is where he had pain.  He tends to externally rotate L hip and flex knee at rest     Vision     Perception  Praxis      Pertinent Vitals/Pain Pain Assessment: Faces Faces Pain Scale: Hurts whole lot Pain Location: left hip Pain Descriptors / Indicators: Tightness Pain Intervention(s): Limited activity within patient's tolerance;Monitored during session;Premedicated before session;Repositioned     Hand Dominance     Extremity/Trunk Assessment Upper Extremity Assessment Upper Extremity Assessment: Overall WFL for tasks assessed           Communication Communication Communication: No difficulties   Cognition Arousal/Alertness: Awake/alert Behavior During Therapy: WFL for tasks  assessed/performed Overall Cognitive Status: No family/caregiver present to determine baseline cognitive functioning                     General Comments       Exercises       Shoulder Instructions      Home Living Family/patient expects to be discharged to:: Skilled nursing facility                                        Prior Functioning/Environment Level of Independence: Independent             OT Diagnosis: Generalized weakness;Acute pain   OT Problem List: Decreased activity tolerance;Decreased knowledge of use of DME or AE;Pain;Decreased strength;Decreased knowledge of precautions   OT Treatment/Interventions: Self-care/ADL training;DME and/or AE instruction;Patient/family education;Therapeutic activities    OT Goals(Current goals can be found in the care plan section) Acute Rehab OT Goals Patient Stated Goal: get back to being independent OT Goal Formulation: With patient Time For Goal Achievement: 12/22/14 Potential to Achieve Goals: Good  OT Frequency: Min 2X/week   Barriers to D/C:            Co-evaluation PT/OT/SLP Co-Evaluation/Treatment: Yes Reason for Co-Treatment: For patient/therapist safety PT goals addressed during session: Mobility/safety with mobility OT goals addressed during session: ADL's and self-care      End of Session    Activity Tolerance: Patient limited by fatigue Patient left: in bed;with call bell/phone within reach   Time: 1123-1156 OT Time Calculation (min): 33 min Charges:  OT General Charges $OT Visit: 1 Procedure G-Codes:    Marc [name] 01/12/2015, 12:34 PM   Lesle Chris, OTR/L [phone] 01/12/15

## 2014-12-15 NOTE — Hospital Discharge Follow-Up (Signed)
Case discussed w/ Lanier Clam, CM. The  plan at this time is d/c to SNF.

## 2014-12-15 NOTE — Op Note (Signed)
NAMECLEMENCE, STILLINGS            ACCOUNT NO.:  [id number]  MEDICAL RECORD NO.:  [id number]  LOCATION:  1617                         FACILITY:  [facility]  PHYSICIAN:  Madlyn Frankel. Charlann Boxer, M.D.  DATE OF BIRTH:  09-15-1960  DATE OF PROCEDURE:  12/14/2014 DATE OF DISCHARGE:                              OPERATIVE REPORT   PREOPERATIVE DIAGNOSIS:  Septic left hip joint.  POSTOPERATIVE DIAGNOSIS: 1. Septic left hip joint. 2. 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 3. Moderate degenerative joint disease.  PROCEDURE:  Left hip resection arthroplasty, placement of temporary antibiotic cemented total hip arthroplasty from [facility].  SURGEON:  Madlyn Frankel. Charlann Boxer, M.D.  ASSISTANT:  Lanney Gins, PA-C.  Note that Mr. Carmon Sails was present for the entirety of the case from preoperative position, perioperative management of the operative extremity, general facilitation of the case, and primary wound closure.  ANESTHESIA:  General.  SPECIMENS:  Joint, fluids, swabs as well as the patient's femoral head were all sent to Pathology for Gram stain and culture evaluation and analysis.  FINDINGS:  Upon entry into the joint capsule, the patient had significant inflammatory fluid that had come out of the joint surface. In addition, the patient's femoral head bone quality was noted to be extremely soft.  All findings consistent clinically with infection.  DRAINS:  None.  COMPLICATION:  None apparent.  INDICATION FOR PROCEDURE:  Mr. Mctigue is a 54 year old male admitted to the hospital on July 22.  The patient had been in our hospital for recurrent infections and was noted to be bacteremic with concerns for an infectious source in his hip joint based on workup.  A colleague had seen Mr. Ducat and consulted on and ordered a hip aspiration which was inconclusive based on clotting.  I was asked to get involved with his care.  Upon review of his presentation amount of pain he was having any count hip  movement, clinically it was very suspicious for infection.  He had elevated lab counts.  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  Given all the findings noted above, I discussed at length with  Mr. Whetsell options including formal open I and D with an inconclusive result with that based on the limitation of the avascular cartilaginous surface, the potential underlying moderate degenerate joint identified by MRI and questionable avascular necrosis and persistent pain.  This was discussed and reviewed as compared to resection arthroplasty and placement of PROSTALAC acrylic antibiotic cemented total hip arthroplasty.  After discussing pros and cons, risks, and benefits, and rationale of each of approach, he has elected to proceed with the latter.  Risks and benefits were discussed.  Postoperative course of 6 weeks IV antibiotics followed by observation, possible repeat MRI, and followup lab work to make certain that his infection had cleared prior to placing the total hip arthroplasty.  Risks and benefits discussed and reviewed, consent was obtained for benefit of improved pain in management of infection.  PROCEDURE IN DETAIL:  The patient was brought to operative theater. Once adequate anesthesia, preoperative antibiotics, had already been given a therapeutic level 1200 mg of vancomycin, he was positioned  into the right lateral decubitus position with left side up.  The left lower extremity was then prepped and draped in sterile fashion.  Time-out was performed identifying the patient, planned procedure, and extremity.  A lateral based incision was made for posterior approach to the hip.  The iliotibial band and gluteal fascia then split for posterior approach.  The post stress to the hip joint was now exposed.  Once I had this exposed with excellent visualization I  made longitudinal slit into the capsule with a probably 30 mL joint effusion with inflammatory to purulent appearing fluid out of the joint surface.  I took a syringe full of this and sent to pathology for their evaluation as well as swabs inside the joint itself.  At this point, I have continued the capsular exposure back to the acetabular rim, so I could remove the labrum as an avascular structure.  The hip was then dislocated.  The neck osteotomy was made through the trochanteric fossa.  It was here that we noted that the bone quality was fairly poor and soft.  Femoral head was removed and sent to Pathology for further evaluation from an infection standpoint. At this point with further exposure of the proximal femur and positioning, we began by open up the proximal femur, then hand reamed once, and irrigated out with normal saline. I then used the lateralized reamer in order to prevent excessive varus position in the cemented component.  The proximal femur was then broached up to a size 5 broach and I irrigated the canal and impacted down the canal as we moved to the acetabular side.  Acetabular exposure was obtained with retractors, labrum was debrided.  I then began reaming with a 45 reamer up to a 51 reamer to remove all cartilage surfaces back to punctate bleeding bone.  Once this was done, I irrigated the hip with 2500 mL normal saline solution with pulse lavage.  During the irrigation time, we mixed 2 batches of cement on the back table.  At each batch had 1 g of vancomycin 1.2 of tobramycin.  One batch of cement was utilized for the acetabular component which was a cemented PROSTALAC liner measuring 42 mm in diameter.  Once the cement was ready, I then held the acetabular liner at about 35-40 degrees of abduction, 20 degrees for froward flexion until the cement cured.  While this was being carried out, Babish was on the back table, placing cement around a size 2 Summit  basic stem.  He made certain to size the cement mantle that would keep it under the size 4-5 broach to allow for easy placement.  Once the acetabular cement settled, I did trial to make certain that I was happy with leg length and stability of the hip.  Once this was confirmed, we mixed a 3rd batch of cement again with 1 g of vancomycin and 1.2 of tobramycin and used this cement over the proximal aspect of the cemented stem in place in the canal.  The stem was held in appropriate anteversion matching the native anteversion actually.  Once the cement had fully cured, I re-trialed and selected a 32+ 1 metal ball. The hip was then reduced and reduced even so that the hip ball was suctioned into the acetabular liner.  The hip was irrigated throughout the case.  The final 500 mL were used to irrigate out the canal in the hip.  At this point, I reapproximated the iliotibial band and gluteal fascia using #1  PDS suture.  The remaining wound was closed with 2-0 Vicryl and staples on the skin.  The skin was then cleaned, dried, and dressed sterilely using Xeroform, then gauze and tape.  He was then brought to the recovery room in stable condition tolerating the procedure well.  The patient is admitted to the Medical service.  Infectious Disease will be involved in managing his care.  We will follow him while he is in the hospital.  Again, I would anticipate 6 weeks of IV antibiotics, off antibiotics following his lab work, and looks his labs are improving at 2 to 2-1/2 months, new hip replacement placed provided there were no complications with his temporary hip joint.     Madlyn Frankel Charlann Boxer, M.D.     MDO/MEDQ  D:  12/14/2014  T:  12/15/2014  Job:  [id number]

## 2014-12-16 DIAGNOSIS — I38 Endocarditis, valve unspecified: Secondary | ICD-10-CM

## 2014-12-16 DIAGNOSIS — I2690 Septic pulmonary embolism without acute cor pulmonale: Secondary | ICD-10-CM

## 2014-12-16 DIAGNOSIS — M009 Pyogenic arthritis, unspecified: Principal | ICD-10-CM

## 2014-12-16 DIAGNOSIS — M00052 Staphylococcal arthritis, left hip: Secondary | ICD-10-CM

## 2014-12-16 DIAGNOSIS — I10 Essential (primary) hypertension: Secondary | ICD-10-CM

## 2014-12-16 DIAGNOSIS — Z96642 Presence of left artificial hip joint: Secondary | ICD-10-CM

## 2014-12-16 DIAGNOSIS — B9562 Methicillin resistant Staphylococcus aureus infection as the cause of diseases classified elsewhere: Secondary | ICD-10-CM

## 2014-12-16 LAB — CBC
HCT: 25.3 % — ABNORMAL LOW (ref 39.0–52.0)
Hemoglobin: 8 g/dL — ABNORMAL LOW (ref 13.0–17.0)
MCH: 25.5 pg — AB (ref 26.0–34.0)
MCHC: 31.6 g/dL (ref 30.0–36.0)
MCV: 80.6 fL (ref 78.0–100.0)
Platelets: 343 10*3/uL (ref 150–400)
RBC: 3.14 MIL/uL — AB (ref 4.22–5.81)
RDW: 15 % (ref 11.5–15.5)
WBC: 15.4 10*3/uL — AB (ref 4.0–10.5)

## 2014-12-16 LAB — BASIC METABOLIC PANEL
Anion gap: 7 (ref 5–15)
BUN: 12 mg/dL (ref 6–20)
CHLORIDE: 103 mmol/L (ref 101–111)
CO2: 29 mmol/L (ref 22–32)
CREATININE: 0.85 mg/dL (ref 0.61–1.24)
Calcium: 8.2 mg/dL — ABNORMAL LOW (ref 8.9–10.3)
Glucose, Bld: 94 mg/dL (ref 65–99)
Potassium: 3.6 mmol/L (ref 3.5–5.1)
Sodium: 139 mmol/L (ref 135–145)

## 2014-12-16 LAB — GLUCOSE, CAPILLARY
GLUCOSE-CAPILLARY: 243 mg/dL — AB (ref 65–99)
Glucose-Capillary: 83 mg/dL (ref 65–99)
Glucose-Capillary: 105 mg/dL — ABNORMAL HIGH (ref 65–99)
Glucose-Capillary: 183 mg/dL — ABNORMAL HIGH (ref 65–99)

## 2014-12-16 LAB — CULTURE, BLOOD (ROUTINE X 2)
Culture: NO GROWTH
Culture: NO GROWTH

## 2014-12-16 MED ORDER — RIFAMPIN 300 MG PO CAPS
300.0000 mg | ORAL_CAPSULE | Freq: Every day | ORAL | Status: DC
Start: 2014-12-16 — End: 2014-12-18
  Administered 2014-12-16 – 2014-12-18 (×3): 300 mg via ORAL
  Filled 2014-12-16 (×3): qty 1

## 2014-12-16 MED ORDER — INSULIN GLARGINE 100 UNIT/ML ~~LOC~~ SOLN
30.0000 [IU] | Freq: Every day | SUBCUTANEOUS | Status: DC
  Administered 2014-12-17: 30 [IU] via SUBCUTANEOUS
  Filled 2014-12-16: qty 0.3

## 2014-12-16 NOTE — Progress Notes (Signed)
Physical Therapy Treatment Patient Details Name: Marc [name] MRN: [id number] DOB: 1960-10-05 Today's Date: 12/16/2014    History of Present Illness Marc [name] is a 54 y.o. 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 An I+D was performed in the ED, and he was placed on IV Vancomycin and Orhtopedics. S/P placement of antibiotic laden cemented THR    PT Comments    Patient mobilized better to edge of bed today leading to the left side.  Remains unable to advance the L Leg during swing. Provided leg lifter. Instructed in quad sets and to work on internal rotation to neutral and knee extension.  Follow Up Recommendations  SNF;Supervision/Assistance - 24 hour     Equipment Recommendations  Rolling walker with 5" wheels    Recommendations for Other Services       Precautions / Restrictions Precautions Precautions: Fall;Posterior Hip Restrictions LLE Weight Bearing: Partial weight bearing LLE Partial Weight Bearing Percentage or Pounds: 50    Mobility  Bed Mobility Overal bed mobility: Needs Assistance Bed Mobility: Supine to Sit     Supine to sit: Mod assist;+2 for physical assistance;+2 for safety/equipment;HOB elevated     General bed mobility comments: assist for LLE and trunk; pt used leg lifter to self assist LLE along with PT assisting, assist trunk to upright. extra time  to transition to sitting. Support of LLE throughout.  Transfers Overall transfer level: Needs assistance Equipment used: Rolling walker (2  wheeled) Transfers: Sit to/from Stand Sit to Stand: Mod assist;+2 physical assistance;From elevated surface;+2 safety/equipment         General transfer comment: assist to rise and stabilize.  Slow transition; cues for UE/LE placement, extra time to lower to the bed.  Ambulation/Gait Ambulation/Gait assistance: Mod assist;+2 safety/equipment;+2 physical assistance Ambulation Distance (Feet): 5 Feet Assistive device: Rolling walker (2 wheeled)       General Gait Details: required assist to advance the L eft leg every step, much extra time to ambulate, cues for posture.   Stairs            Wheelchair Mobility    Modified Rankin (Stroke Patients Only)       Balance                                    Cognition Arousal/Alertness: Awake/alert                          Exercises Other Exercises Other Exercises: knee extension encouraged, Hip internal rotation to neutral    General Comments        Pertinent Vitals/Pain Faces Pain Scale: Hurts little more Pain Location: when knee is extended and hip is mioved to more neutral from  being externally rotated. Pain Descriptors / Indicators: Tightness;Sharp;Grimacing    Home Living Family/patient expects to be discharged to:: Skilled nursing facility Living Arrangements: Parent  Prior Function            PT Goals (current goals can now be found in the care plan section) Progress towards PT goals: Progressing toward goals    Frequency  Min 3X/week    PT Plan Current plan remains appropriate    Co-evaluation PT/OT/SLP Co-Evaluation/Treatment: Yes Reason for Co-Treatment: Complexity of the patient's impairments (multi-system involvement);For patient/therapist safety PT goals addressed during session: Mobility/safety with mobility OT goals addressed during session: ADL's and self-care     End of Session   Activity Tolerance: Patient tolerated treatment  well Patient left: in chair;with call bell/phone within reach     Time: 1045-1120 PT Time Calculation (min) (ACUTE ONLY): 35 min  Charges:  $Therapeutic Activity: 8-22 mins                    G Codes:      Rada Hay 12/16/2014, 12:36 PM

## 2014-12-16 NOTE — Progress Notes (Signed)
CSW assisting with d/c planning. [facility] is able to accept pt with [facility] when pt is stable  for d/c.   Cori Razor LCSW [phone]

## 2014-12-16 NOTE — Progress Notes (Addendum)
Occupational Therapy Treatment Patient Details Name: Marc [name] MRN: [id number] DOB: 08/17/1960 Today's Date: 12/16/2014    History of present illness Marc [name] is a 54 y.o. 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 An I+D was performed in the ED, and he was placed on IV Vancomycin and Orhtopedics. S/P placement of antibiotic laden cemented THR   OT comments  Pt is progressing  Needs reinforcement for posterior thps  Follow Up Recommendations  SNF    Equipment Recommendations  3 in 1 bedside comode    Recommendations for Other Services      Precautions / Restrictions Precautions Precautions: Fall;Posterior Hip Restrictions LLE Weight Bearing: Partial weight bearing LLE Partial Weight Bearing Percentage or Pounds: 50       Mobility Bed Mobility Overal bed mobility: Needs Assistance Bed Mobility: Supine to Sit     Supine to sit: Mod assist;+2 for physical assistance;+2 for safety/equipment;HOB elevated     General bed mobility comments: assist for LLE and trunk; pt used leg lifter to self assist LLE along with PT assisting, assist trunk to upright. extra time  to transition to sitting. Support of LLE throughout.  Did better using bedrail instead of hand on bed to assist with all aspects of bed mobility  Transfers Overall transfer level: Needs assistance Equipment used: Rolling walker (2 wheeled) Transfers: Sit to/from Stand Sit to Stand: Mod assist;+2 physical assistance;From elevated surface;+2 safety/equipment         General transfer comment: assist to rise and stabilize.  Slow transition; cues for UE/LE placement    Balance                                   ADL               Lower Body Bathing: +2 for physical assistance;Moderate assistance;Adhering to hip precautions;With adaptive equipment (for sit to stand; min A +2 for task)       Lower Body Dressing: Maximal assistance;+2 for physical assistance;Sit to/from stand;Adhering to hip precautions;With adaptive equipment   Toilet Transfer: Moderate assistance;+2 for physical assistance;Ambulation-few steps to recliner   Toileting- Clothing Manipulation and Hygiene:  Moderate assist +2 for sit to stand and min A for safety with hygiene          General ADL Comments: educated on AE and issued to him (hip kit and leg lifter).  Pt used leg lifter well to assist with bed mobility.  Needs reinforcement with THPs and AE.  Reviewed THPs (posterior).  Pt did not recall any.  Needs cues especially for 90 degrees.  Performed hygiene as pt was wet when we arrived      Vision                     Perception     Praxis      Cognition   Behavior During Therapy: [facility] for tasks assessed/performed;Anxious Overall Cognitive Status: No family/caregiver present  to determine baseline cognitive functioning                  General Comments: followed commands better; did not recall any THPs    Extremity/Trunk Assessment               Exercises    Shoulder Instructions       General Comments      Pertinent Vitals/ Pain       Pain Assessment: Faces Faces Pain Scale: Hurts little more (Simultaneous filing. User may not have seen previous data.) Pain Location: when knee is extended and hip is mioved to more neutral from  being externally rotated. Pain Descriptors / Indicators: Sore (Simultaneous filing. User may not have seen previous data.) Pain Intervention(s): Limited activity within patient's  tolerance;Monitored during session;Premedicated before session;Repositioned  Home Living Family/patient expects to be discharged to:: Skilled nursing facility Living Arrangements: Parent                                      Prior Functioning/Environment              Frequency Min 2X/week     Progress Toward Goals  OT Goals(current goals can now be found in the care plan section)  Progress towards OT goals: Progressing toward goals  Acute Rehab OT Goals Patient Stated Goal: get back to being independent  Plan      Co-evaluation    PT/OT/SLP Co-Evaluation/Treatment: Yes Reason for Co-Treatment: Complexity of the patient's impairments (multi-system involvement);For patient/therapist safety PT goals addressed during session: Mobility/safety with mobility OT goals addressed during session: ADL's and self-care      End of Session     Activity Tolerance Patient tolerated treatment well   Patient Left in chair;with call bell/phone within reach   Nurse Communication Mobility status        Time: 4132-4401 OT Time Calculation (min): 34 min  Charges: OT General Charges $OT Visit: 1 Procedure OT Treatments $Self Care/Home Management : 8-22 mins  Marc [name] 12/16/2014, 12:38 PM  Lesle Chris, OTR/L [phone] 12/16/2014

## 2014-12-16 NOTE — Consult Note (Signed)
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 male.  HPI: 54 yo M with DM2 and HTN who was adm 6-27 to 7-3 to [facility] with DKA (CBG >500, gap+), CAP and UTI/pyelo.  His course was complicated by obstructive uropathy and he was d/c with foley.  His CXR was notable for LUL and R base edema vs multifocal infiltrate. He was treated with vanco/zosyn in hospital and then d/c home with levaquin.  On 7-6 he was found to have BCx+ Staph aureus, he was called to return to hospital.  He did return on 7-11 to have his foley removed, still having fevers and elevated FSG due to not having insulin. He was found on CXR to have multiple nodules. WBC 12.0. Glc 551, Cr normal. He was started on vanco/zosyn.  CT chest showed : Multiple pulmonary nodules bilaterally, many with cavitation. Appearance is compatible with septic emboli... He had BAL showing mixed flora, and had TTE (-). He did not undergo TEE. He was d/c hom on 7-15, to remain on IV vanco for 6 weeks (8-21).  He returns on 7-19 with low grade temp at home and fatigue. He was hypertensive and afebrile in ED, WBC was 10.9.  He left AMA on 7-21.  He returned on 7-22 with L hip pain and difficulty walking. He was found on MRI of his L hip to have septic arthritis-  He was afebrile and  his WBC was 13.6. His vancomycin was restarted and new PIC was placed. He continued to have difficulty ambulating and on 7-25 he underwent resection of his L hip and placement of antibiotic laden cemented total hip replacement. His WBC has increased slightly today (15.4) from previous (13.0)   Past Medical History  Diagnosis Date  . Diabetes mellitus without complication   . Hypertension   . Endocarditis     Past Surgical History  Procedure Laterality Date  . No past surgeries    . Video bronchoscopy Bilateral 12/03/2014    Procedure: VIDEO BRONCHOSCOPY WITHOUT FLUORO;  Surgeon: Chesley Mires, MD;  Location: [facility];  Service: Cardiopulmonary;  Laterality: Bilateral;  . Total hip revision Left 12/14/2014    Procedure: Left hip resection with prostalac ;  Surgeon: Paralee Cancel, MD;  Location: [facility];  Service: Orthopedics;  Laterality: Left;     No Known Allergies  Medications:  Scheduled: . amLODipine  10 mg Oral Daily  . aspirin EC  325 mg Oral BID  . celecoxib  200 mg Oral Q12H  . Chlorhexidine Gluconate Cloth  6 each Topical Q0600  . dexamethasone  10 mg Intravenous Once  . docusate sodium  100 mg Oral BID  . ferrous sulfate  325 mg Oral TID PC  . folic acid  1 mg Oral Daily  . glimepiride  2 mg Oral Q breakfast  . insulin aspart  0-15 Units Subcutaneous TID WC  . insulin glargine  35 Units Subcutaneous QHS  . lisinopril  5 mg Oral Daily  . multivitamin with minerals  1 tablet Oral Daily  . mupirocin ointment  1 application Nasal BID  . pantoprazole  40 mg Oral Daily  . polyethylene glycol  17 g Oral BID  . sodium chloride  10-40 mL Intracatheter Q12H  . vancomycin  1,250 mg Intravenous Q12H    Abtx:  Anti-infectives    Start     Dose/Rate Route Frequency Ordered Stop   12/14/14 2000  ceFAZolin (ANCEF) IVPB 2 g/50 mL premix     2 g 100 mL/hr over 30 Minutes Intravenous Every 6 hours 12/14/14 1930 12/15/14 0211   12/14/14 1657  tobramycin (NEBCIN) powder  Status:   Discontinued       As needed 12/14/14 1658 12/14/14 1742   12/14/14 1656  vancomycin (VANCOCIN) powder  Status:  Discontinued       As needed 12/14/14 1657 12/14/14 1742   12/12/14 1600  vancomycin (VANCOCIN) 1,250 mg in sodium chloride 0.9 % 250 mL IVPB     1,250 mg 166.7 mL/hr over 90 Minutes Intravenous Every 12 hours 12/12/14 1404     12/12/14 0600  vancomycin (VANCOCIN) IVPB 1000 mg/200 mL premix  Status:  Discontinued     1,000 mg 200 mL/hr over 60 Minutes Intravenous Every 12 hours 12/12/14 0315 12/12/14 1404   12/11/14 1530  vancomycin (VANCOCIN) 1,500 mg in sodium chloride 0.9 % 500 mL IVPB     1,500 mg 250 mL/hr over 120 Minutes Intravenous  Once 12/11/14 1500 12/11/14 1809      Total days of antibiotics: 5 (vanco 7-25)          Social History:  reports that he has never smoked. He has never used smokeless tobacco. He reports that he does not drink alcohol or use illicit drugs.  Family History  Problem Relation Age of Onset  . Hypertension Mother   . Diabetes Sister   . Diabetes Brother     General ROS: negative for - diarrhea no loose BM, no dysuria, +fever/chills at home, no dysphagia, no neruopathy, abn fsg per pt, otherwise 12 points reviewed, see HPI.   Blood pressure 164/86, pulse 98, temperature 98.7 F (37.1 C), temperature source Oral, resp. rate 16, height 5' 9"  (1.753 m), weight 77.111 kg (170 lb), SpO2 97 %. General appearance: alert, cooperative and no distress Eyes: conjunctivae/corneas clear. PERRL, EOM's intact. Fundi benign. Throat: normal findings: oropharynx pink & moist without lesions or evidence of thrush Neck: no adenopathy and supple, symmetrical, trachea midline Lungs: clear to auscultation bilaterally Heart: regular rate and rhythm, S1, S2 normal, no murmur, click, rub or gallop Abdomen: normal findings: bowel sounds normal and soft, non-tender Extremities: edema none and thickened skin on feet. no skin breakdown/diabetic foot ulcers.    Skin: L hip wound dressed.    Results for orders placed or performed during the hospital encounter of 12/11/14 (from the past 48 hour(s))  Glucose, capillary     Status: Abnormal   Collection Time: 12/14/14  9:55 PM  Result Value Ref Range   Glucose-Capillary 195 (H) 65 - 99 mg/dL  Vancomycin, trough     Status: None   Collection Time: 12/15/14  2:50 AM  Result Value Ref Range   Vancomycin Tr 15 10.0 - 20.0 ug/mL  CBC     Status: Abnormal   Collection Time: 12/15/14  2:50 AM  Result Value Ref Range   WBC 13.0 (H) 4.0 - 10.5  K/uL   RBC 3.38 (L) 4.22 - 5.81 MIL/uL   Hemoglobin 8.8 (L) 13.0 - 17.0 g/dL   HCT 27.6 (L) 39.0 - 52.0 %   MCV 81.7 78.0 - 100.0 fL   MCH 26.0 26.0 - 34.0 pg   MCHC 31.9 30.0 - 36.0 g/dL   RDW 15.1 11.5 - 15.5 %   Platelets 384 150 - 400 K/uL  Basic metabolic panel     Status: Abnormal   Collection Time: 12/15/14  2:50 AM  Result Value Ref Range   Sodium 137 135 - 145 mmol/L   Potassium 4.3 3.5 - 5.1 mmol/L    Comment: REPEATED TO VERIFY DELTA CHECK NOTED NO VISIBLE HEMOLYSIS    Chloride 103 101 - 111 mmol/L   CO2 27 22 - 32 mmol/L   Glucose, Bld 288 (H) 65 - 99 mg/dL   BUN 12 6 - 20 mg/dL   Creatinine, Ser 0.98 0.61 - 1.24 mg/dL   Calcium 8.2 (L) 8.9 - 10.3 mg/dL   GFR calc non Af Amer >60 >60 mL/min   GFR calc Af Amer >60 >60 mL/min    Comment: (NOTE) The eGFR has been calculated using the CKD EPI equation. This calculation has not been validated in all clinical situations. eGFR's persistently <60 mL/min signify possible Chronic Kidney Disease.    Anion gap 7 5 - 15  Glucose, capillary     Status: Abnormal   Collection Time: 12/15/14  7:23 AM  Result Value Ref Range   Glucose-Capillary 220 (H) 65 - 99 mg/dL  Glucose, capillary     Status: Abnormal   Collection Time: 12/15/14  1:05 PM  Result Value Ref Range   Glucose-Capillary 210 (H) 65 - 99 mg/dL  Glucose, capillary     Status: None   Collection Time: 12/15/14  4:44 PM  Result Value  Ref Range   Glucose-Capillary 95 65 - 99 mg/dL  Glucose, capillary     Status: Abnormal   Collection Time: 12/15/14  9:42 PM  Result Value Ref Range   Glucose-Capillary 133 (H) 65 - 99 mg/dL   Comment 1 Notify RN   CBC     Status: Abnormal   Collection Time: 12/16/14  5:00 AM  Result Value Ref Range   WBC 15.4 (H) 4.0 - 10.5 K/uL   RBC 3.14 (L) 4.22 - 5.81 MIL/uL   Hemoglobin 8.0 (L) 13.0 - 17.0 g/dL   HCT 25.3 (L) 39.0 - 52.0 %   MCV 80.6 78.0 - 100.0 fL   MCH 25.5 (L) 26.0 - 34.0 pg   MCHC 31.6 30.0 - 36.0 g/dL   RDW 15.0 11.5 - 15.5 %   Platelets 343 150 - 400 K/uL  Basic metabolic panel     Status: Abnormal   Collection Time: 12/16/14  5:00 AM  Result Value Ref Range   Sodium 139 135 - 145 mmol/L   Potassium 3.6 3.5 - 5.1 mmol/L   Chloride 103 101 - 111 mmol/L   CO2 29 22 - 32 mmol/L   Glucose, Bld 94 65 - 99 mg/dL   BUN 12 6 - 20 mg/dL   Creatinine, Ser 0.85 0.61 - 1.24 mg/dL   Calcium 8.2 (L) 8.9 - 10.3 mg/dL   GFR calc non Af Amer >60 >60 mL/min   GFR calc Af Amer >60 >60 mL/min    Comment: (NOTE) The eGFR has been calculated using the CKD EPI equation. This calculation has not been validated in all clinical situations. eGFR's persistently <  60 mL/min signify possible Chronic Kidney Disease.    Anion gap 7 5 - 15  Glucose, capillary     Status: None   Collection Time: 12/16/14  7:56 AM  Result Value Ref Range   Glucose-Capillary 83 65 - 99 mg/dL  Glucose, capillary     Status: Abnormal   Collection Time: 12/16/14 12:14 PM  Result Value Ref Range   Glucose-Capillary 105 (H) 65 - 99 mg/dL  Glucose, capillary     Status: Abnormal   Collection Time: 12/16/14  5:16 PM  Result Value Ref Range   Glucose-Capillary 183 (H) 65 - 99 mg/dL      Component Value Date/Time   SDES HIP LEFT FEMORAL HEAD 12/14/2014 1717   SPECREQUEST NONE 12/14/2014 1717   CULT  12/14/2014 1717    NO ANAEROBES ISOLATED; CULTURE IN PROGRESS FOR 5 DAYS Performed at [facility] PENDING 12/14/2014 1717   No results found. Recent Results (from the past 240 hour(s))  Blood culture (routine x 2)     Status: None   Collection Time: 12/08/14  6:09 PM  Result Value Ref Range Status   Specimen Description BLOOD LEFT HAND  Final   Special Requests BOTTLES DRAWN AEROBIC AND ANAEROBIC 5CC EACH  Final   Culture   Final    NO GROWTH 5 DAYS Performed at [facility]    Report Status 12/13/2014 FINAL  Final  Blood culture (routine x 2)     Status: None   Collection Time: 12/08/14  6:30 PM  Result Value Ref Range Status   Specimen Description BLOOD L HAND  Final   Special Requests BOTTLES DRAWN AEROBIC AND ANAEROBIC 5CC  Final   Culture   Final    NO GROWTH 5 DAYS Performed at [facility]    Report Status 12/13/2014 FINAL  Final  Blood culture (routine x 2)     Status: None   Collection Time: 12/11/14  3:15 PM  Result Value Ref Range Status   Specimen Description BLOOD RIGHT ANTECUBITAL  Final   Special Requests BOTTLES DRAWN AEROBIC AND ANAEROBIC 5CC  Final   Culture   Final    NO GROWTH 5 DAYS Performed at [facility]    Report Status 12/16/2014 FINAL  Final  Blood culture (routine x 2)     Status: None   Collection Time: 12/11/14  3:22 PM  Result Value Ref Range Status   Specimen Description BLOOD RIGHT HAND  Final   Special Requests BOTTLES DRAWN AEROBIC AND ANAEROBIC 4CC  Final   Culture   Final    NO GROWTH 5 DAYS Performed at [facility]    Report Status 12/16/2014 FINAL  Final  Body fluid culture     Status: None   Collection Time: 12/11/14 10:51 PM  Result Value Ref Range Status   Specimen Description SYNOVIAL  Final   Special Requests NONE  Final   Gram Stain   Final    WBC PRESENT, PREDOMINANTLY PMN NO ORGANISMS SEEN    Culture   Final    NO GROWTH 3 DAYS Performed at [facility]    Report Status 12/15/2014 FINAL  Final  Surgical pcr screen     Status: Abnormal   Collection Time:  12/14/14  6:40 AM  Result Value Ref Range Status   MRSA, PCR POSITIVE (A) NEGATIVE Final   Staphylococcus aureus POSITIVE (A) NEGATIVE Final    Comment:  The Xpert SA Assay (FDA approved for NASAL specimens in patients over 25 years of age), is one component of a comprehensive surveillance program.  Test performance has been validated by [facility] for patients greater than or equal to 69 year old. It is not intended to diagnose infection nor to guide or monitor treatment. CRITICAL RESULT CALLED TO, READ BACK BY AND VERIFIED WITH: S.WHITTMORE AT 0820 ON 12/14/14 BY S.VANHOORNE   Anaerobic culture     Status: None (Preliminary result)   Collection Time: 12/14/14  4:31 PM  Result Value Ref Range Status   Specimen Description WOUND  Final   Special Requests NONE  Final   Gram Stain   Final    ABUNDANT WBC PRESENT,BOTH PMN AND MONONUCLEAR NO ORGANISMS SEEN    Culture   Final    NO ANAEROBES ISOLATED; CULTURE IN PROGRESS FOR 5 DAYS Performed at [facility]    Report Status PENDING  Incomplete  Body fluid culture     Status: None (Preliminary result)   Collection Time: 12/14/14  4:31 PM  Result Value Ref Range Status   Specimen Description   Final    HIP LEFT        Lymphocytosis. If a lymphoproliferative disorder is in the clinical differential diagnosis, fresh unrefrigerated    Special Requests PATIENT ON FOLLOWING VANCOMYCIN  Final   Gram Stain   Final    ABUNDANT WBC PRESENT,BOTH PMN AND MONONUCLEAR NO ORGANISMS SEEN    Culture   Final    NO GROWTH 2 DAYS Performed at [facility]    Report Status PENDING  Incomplete  Tissue culture     Status: None (Preliminary result)   Collection Time: 12/14/14  5:04 PM  Result Value Ref Range Status   Specimen Description HIP LEFT FEMORAL HEAD  Final   Special Requests PATIENT ON FOLLOWING VANCOMYCIN  Final   Gram Stain   Final    RARE WBC PRESENT,BOTH PMN AND MONONUCLEAR NO ORGANISMS SEEN Performed  at [facility]    Culture   Final    NO GROWTH 1 DAY Performed at [facility]    Report Status PENDING  Incomplete  Anaerobic culture     Status: None (Preliminary result)   Collection Time: 12/14/14  5:17 PM  Result Value Ref Range Status   Specimen Description HIP LEFT FEMORAL HEAD  Final   Special Requests NONE  Final   Gram Stain   Final    FEW WBC PRESENT,BOTH PMN AND MONONUCLEAR NO ORGANISMS SEEN Performed at [facility]    Culture   Final    NO ANAEROBES ISOLATED; CULTURE IN PROGRESS FOR 5 DAYS Performed at [facility]    Report Status PENDING  Incomplete      12/16/2014, 5:52 PM     LOS: 4 days

## 2014-12-16 NOTE — Progress Notes (Signed)
Physical Therapy Treatment Patient Details Name: Marc [name] MRN: [id number] DOB: 05/18/1961 Today's Date: 12/16/2014    History of Present Illness FREDDERICK SWANGER is a 54 y.o. 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 An I+D was performed in the ED, and he was placed on IV Vancomycin and Orhtopedics. S/P placement of antibiotic laden cemented THR    PT Comments    Patient incontinent of urine while in recliner  And again upon standing up. Patient is mobilizing somewhat better,  Using leg lifter to assist  LLE, is getting L knee extension and less external rotation.  Follow Up Recommendations  SNF;Supervision/Assistance - 24 hour     Equipment Recommendations  Rolling walker with 5" wheels    Recommendations for Other Services       Precautions / Restrictions Precautions Precautions: Fall;Posterior Hip Precaution Comments: incontinent of urine Restrictions Weight Bearing Restrictions: Yes LLE Weight Bearing: Partial weight bearing LLE Partial Weight Bearing Percentage or Pounds: 50    Mobility  Bed Mobility Overal bed mobility: Needs Assistance Bed Mobility: Sit to Supine     Supine to sit: Mod assist;+2 for physical assistance;+2 for safety/equipment;HOB elevated Sit to supine: Max assist;+2 for physical assistance   General bed mobility comments: assist for LLE and trunk; , use of rails to lower seldf to supine, extra time  to transition to lying down and reposition in bed.  Transfers Overall transfer level: Needs  assistance Equipment used: Rolling walker (2 wheeled) Transfers: Sit to/from [employer] Sit to Stand: Mod assist;+2 physical assistance;From elevated surface;+2 safety/equipment         General transfer comment: assist to rise and stabilize.  Slow transition; cues for UE/LE placement, extra time to lower to the bed.assist to move the L leg.Incontinent of urine when standing.  Ambulation/Gait Ambulation/Gait assistance: Mod assist;+2 safety/equipment;+2 physical assistance Ambulation Distance (Feet): 5 Feet Assistive device: Rolling walker (2 wheeled)       General Gait Details: required assist to advance the L eft leg every step, much extra time to ambulate, cues for posture.   Stairs            Wheelchair Mobility    Modified Rankin (Stroke Patients Only)       Balance                                    Cognition Arousal/Alertness: Awake/alert Behavior During Therapy: WFL for tasks assessed/performed;Anxious Overall Cognitive Status: No family/caregiver present to determine baseline cognitive functioning                 General Comments: followed commands better; did not recall any THPs    Exercises Other Exercises Other Exercises: knee extension encouraged, Hip internal rotation to neutral    General Comments        Pertinent Vitals/Pain Pain Assessment: Faces Faces Pain Scale: Hurts little more Pain Location: when L leg is manipulated during transfers. Pain Descriptors /  Indicators: Tightness;Cramping;Grimacing;Guarding Pain Intervention(s): Limited activity within patient's tolerance;Monitored during session;Premedicated before session;Repositioned    Home Living Family/patient expects to be discharged to:: Skilled nursing facility Living Arrangements: Parent                  Prior Function            PT Goals (current goals can now be found in the care plan section) Acute Rehab PT Goals Patient  Stated Goal: get back to being independent Progress towards PT goals: Progressing toward goals    Frequency  Min 3X/week    PT Plan Current plan remains appropriate    Co-evaluation     End of Session   Activity Tolerance: Patient tolerated treatment well Patient left: in bed;with call bell/phone within reach     Time: 1524-1600 PT Time Calculation (min) (ACUTE ONLY): 36 min  Charges:  $Therapeutic Activity: 23-37 mins                    G Codes:      Rada Hay 12/16/2014, 4:22 PM Blanchard Kelch PT [phone]

## 2014-12-16 NOTE — Progress Notes (Signed)
     Subjective: 2 Days Post-Op Procedure(s) (LRB): Left hip resection with prostalac  (Left)   Patient reports pain as moderate, pain controlled. Says the hip hurt, but not too bad.  No events throughout the night.  ID has not seen the patient, will call this morning to get them onboard with patient care.   Objective:   VITALS:   Filed Vitals:   12/16/14  BP: 164/86  Pulse: 98  Temp: 98.7 F (37.1 C)   Resp: 16    Dorsiflexion/Plantar flexion intact Incision: dressing C/D/I No cellulitis present Compartment soft  LABS  Recent Labs  12/14/14 0420 12/15/14 0250 12/16/14 0500  HGB 8.0* 8.8* 8.0*  HCT 25.6* 27.6* 25.3*  WBC 14.8* 13.0* 15.4*  PLT 472* 384 343     Recent Labs  12/14/14 0420 12/15/14 0250 12/16/14 0500  NA 140 137 139  K 3.6 4.3 3.6  BUN CREATININE 0.80 0.98 0.85  GLUCOSE 81 288* 94     Assessment/Plan: 2 Days Post-Op Procedure(s) (LRB): Left hip resection with prostalac  (Left)  ID consult placed. Up with therapy Discharge to SNF eventually, when ready medically      Marc [name]. Marc [name]   PAC  12/16/2014, 9:12 AM

## 2014-12-16 NOTE — Progress Notes (Signed)
PROGRESS NOTE    Marc [name] JWJ:[id number] DOB: 06-Mar-1961 DOA: 12/11/2014 PCP: No PCP Per Patient  HPI/Brief narrative 54 y.o. male with a history of Uncontrolled DM2, medical noncompliance who had been found to havepresumed Septic Emboli and an MRSA bacteremia and was to have a 6 week course of antibiotic rx and Rehab Ctr placement but left AMA on 12/10/2014, and returns to the ED with complaints of increased left hip pain and difficulty walking due to the pain x 3 days. 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 An I+D was performed in the ED, and he was placed on IV Vancomycin and Orhtopedics was consulted. Patient eventually underwent resection of left hip joint and placement of temporary antibiotic laden cemented total hip replacement on 7/25.   Assessment/Plan:  Septic arthritis of left hip - Orthopedics was consulted and patient underwent resection of left hip joint and placement of temporary antibiotic laden cemented total hip replacement on 7/25 - Infectious disease was consulted by orthopedics on 7/27-await recommendations - Operative wound cultures: Negative to date. Blood cultures 27/22: Negative to date  Recent MRSA bacteremia/endocarditis - Continue IV vancomycin and await ID recommendations  Uncontrolled DM 2 - Hemoglobin A1c 12.1 - Continue current dose of Lantus and SSI. Reasonable inpatient control.  Anemia  - Stable.  Essential hypertension - Controlled  DVD prophylaxis: Aspirin 325 twice a day per orthopedics Code Status: Full Family Communication: None at bedside Disposition Plan: To be determined   Consultants:  Orthopedics  ID  Procedures:  resection of left hip joint and placement of temporary antibiotic laden cemented total hip replacement on 7/25  Antibiotics:  IV vancomycin   Subjective: States that he feels much better. Left hip pain much  improved.  Objective: Filed Vitals:   12/15/14 2001 12/16/14 0611 12/16/14 0854 12/16/14 0855  BP: 124/75 138/72 164/86 164/86  Pulse: 104 98    Temp: 98.9 F (37.2 C) 98.7 F (37.1 C)    TempSrc: Oral Oral    Resp: 16 16    Height:      Weight:      SpO2: 98% 97%      Intake/Output Summary (Last 24 hours) at 12/16/14 1909 Last data filed at 12/16/14 1400  Gross per 24 hour  Intake   1480 ml  Output   1050 ml  Net    430 ml   Filed Weights   12/12/14 0400  Weight: 77.111 kg (170 lb)     Exam:  General exam: Pleasant middle-aged male sitting up comfortably on chair this morning. Respiratory system: Clear. No increased work of breathing. Cardiovascular system: S1 & S2 heard, RRR. No JVD, murmurs, gallops, clicks or pedal edema. Gastrointestinal system: Abdomen is nondistended, soft and nontender. Normal bowel sounds heard. Central nervous system: Alert and oriented. No focal neurological deficits. Extremities: Symmetric 5 x 5 power. Left hip surgical site dressing intact.   Data Reviewed: Basic Metabolic Panel:  Recent Labs Lab 12/11/14 1520 12/12/14 0525 12/14/14 0420 12/15/14 0250 12/16/14 0500  NA 133* 134* 140 137 139  K 4.0 4.1 3.6 4.3 3.6  CL 101 102 105 103 103  CO2 GLUCOSE 331* 264* 81 288* 94  BUN CREATININE 1.08 0.96 0.80 0.98 0.85  CALCIUM 8.3* 8.4* 8.4* 8.2* 8.2*   Liver Function Tests: No results for input(s): AST, ALT, ALKPHOS,  BILITOT, PROT, ALBUMIN in the last 168 hours. No results for input(s): LIPASE, AMYLASE in the last 168 hours. No results for input(s): AMMONIA in the last 168 hours. CBC:  Recent Labs Lab 12/11/14 1520 12/12/14 0525 12/14/14 0420 12/15/14 0250 12/16/14 0500  WBC 13.6* 12.2* 14.8* 13.0* 15.4*  NEUTROABS 11.0*  --  11.8*  --   --   HGB 8.6* 8.5* 8.0* 8.8* 8.0*  HCT 28.0* 27.3* 25.6* 27.6* 25.3*  MCV 80.7 81.0 80.0 81.7 80.6  PLT 480* 486* 472* 384 343   Cardiac Enzymes: No  results for input(s): CKTOTAL, CKMB, CKMBINDEX, TROPONINI in the last 168 hours. BNP (last 3 results) No results for input(s): PROBNP in the last 8760 hours. CBG:  Recent Labs Lab 12/15/14 1644 12/15/14 2142 12/16/14 0756 12/16/14 1214 12/16/14 1716  GLUCAP 95 133* 83 105* 183*    Recent Results (from the past 240 hour(s))  Blood culture (routine x 2)     Status: None   Collection Time: 12/08/14  6:09 PM  Result Value Ref Range Status   Specimen Description BLOOD LEFT HAND  Final   Special Requests BOTTLES DRAWN AEROBIC AND ANAEROBIC 5CC EACH  Final   Culture   Final    NO GROWTH 5 DAYS Performed at [facility]    Report Status 12/13/2014 FINAL  Final  Blood culture (routine x 2)     Status: None   Collection Time: 12/08/14  6:30 PM  Result Value Ref Range Status   Specimen Description BLOOD L HAND  Final   Special Requests BOTTLES DRAWN AEROBIC AND ANAEROBIC 5CC  Final   Culture   Final    NO GROWTH 5 DAYS Performed at [facility]    Report Status 12/13/2014 FINAL  Final  Blood culture (routine x 2)     Status: None   Collection Time: 12/11/14  3:15 PM  Result Value Ref Range Status   Specimen Description BLOOD RIGHT ANTECUBITAL  Final   Special Requests BOTTLES DRAWN AEROBIC AND ANAEROBIC 5CC  Final   Culture   Final    NO GROWTH 5 DAYS Performed at [facility]    Report Status 12/16/2014 FINAL  Final  Blood culture (routine x 2)     Status: None   Collection Time: 12/11/14  3:22 PM  Result Value Ref Range Status   Specimen Description BLOOD RIGHT HAND  Final   Special Requests BOTTLES DRAWN AEROBIC AND ANAEROBIC 4CC  Final   Culture   Final    NO GROWTH 5 DAYS Performed at [facility]    Report Status 12/16/2014 FINAL  Final  Body fluid culture     Status: None   Collection Time: 12/11/14 10:51 PM  Result Value Ref Range Status   Specimen Description SYNOVIAL  Final   Special Requests NONE  Final   Gram Stain   Final     WBC PRESENT, PREDOMINANTLY PMN NO ORGANISMS SEEN    Culture   Final    NO GROWTH 3 DAYS Performed at [facility]    Report Status 12/15/2014 FINAL  Final  Surgical pcr screen     Status: Abnormal   Collection Time: 12/14/14  6:40 AM  Result Value Ref Range Status   MRSA, PCR POSITIVE (A) NEGATIVE Final   Staphylococcus aureus POSITIVE (A) NEGATIVE Final    Comment:        The Xpert SA Assay (FDA approved for NASAL specimens in patients over 21  years of age), is one component of a comprehensive surveillance program.  Test performance has been validated by [facility] for patients greater than or equal to 13 year old. It is not intended to diagnose infection nor to guide or monitor treatment. CRITICAL RESULT CALLED TO, READ BACK BY AND VERIFIED WITH: S.WHITTMORE AT 0820 ON 12/14/14 BY S.VANHOORNE   Anaerobic culture     Status: None (Preliminary result)   Collection Time: 12/14/14  4:31 PM  Result Value Ref Range Status   Specimen Description WOUND  Final   Special Requests NONE  Final   Gram Stain   Final    ABUNDANT WBC PRESENT,BOTH PMN AND MONONUCLEAR NO ORGANISMS SEEN    Culture   Final    NO ANAEROBES ISOLATED; CULTURE IN PROGRESS FOR 5 DAYS Performed at [facility]    Report Status PENDING  Incomplete  Body fluid culture     Status: None (Preliminary result)   Collection Time: 12/14/14  4:31 PM  Result Value Ref Range Status   Specimen Description   Final    HIP LEFT        Lymphocytosis. If a lymphoproliferative disorder is in the clinical differential diagnosis, fresh unrefrigerated    Special Requests PATIENT ON FOLLOWING VANCOMYCIN  Final   Gram Stain   Final    ABUNDANT WBC PRESENT,BOTH PMN AND MONONUCLEAR NO ORGANISMS SEEN    Culture   Final    NO GROWTH 2 DAYS Performed at [facility]    Report Status PENDING  Incomplete  Tissue culture     Status: None (Preliminary result)   Collection Time: 12/14/14  5:04 PM  Result  Value Ref Range Status   Specimen Description HIP LEFT FEMORAL HEAD  Final   Special Requests PATIENT ON FOLLOWING VANCOMYCIN  Final   Gram Stain   Final    RARE WBC PRESENT,BOTH PMN AND MONONUCLEAR NO ORGANISMS SEEN Performed at [facility]    Culture   Final    NO GROWTH 1 DAY Performed at [facility]    Report Status PENDING  Incomplete  Anaerobic culture     Status: None (Preliminary result)   Collection Time: 12/14/14  5:17 PM  Result Value Ref Range Status   Specimen Description HIP LEFT FEMORAL HEAD  Final   Special Requests NONE  Final   Gram Stain   Final    FEW WBC PRESENT,BOTH PMN AND MONONUCLEAR NO ORGANISMS SEEN Performed at [facility]    Culture   Final    NO ANAEROBES ISOLATED; CULTURE IN PROGRESS FOR 5 DAYS Performed at [facility]    Report Status PENDING  Incomplete          Studies: No results found.      Scheduled Meds: . amLODipine  10 mg Oral Daily  . aspirin EC  325 mg Oral BID  . celecoxib  200 mg Oral Q12H  . Chlorhexidine Gluconate Cloth  6 each Topical Q0600  . dexamethasone  10 mg Intravenous Once  . docusate sodium  100 mg Oral BID  . ferrous sulfate  325 mg Oral TID PC  . folic acid  1 mg Oral Daily  . glimepiride  2 mg Oral Q breakfast  . insulin aspart  0-15 Units Subcutaneous TID WC  . insulin glargine  35 Units Subcutaneous QHS  . lisinopril  5 mg Oral Daily  . multivitamin with minerals  1 tablet Oral Daily  . mupirocin  ointment  1 application Nasal BID  . pantoprazole  40 mg Oral Daily  . polyethylene glycol  17 g Oral BID  . rifampin  300 mg Oral Daily  . sodium chloride  10-40 mL Intracatheter Q12H  . vancomycin  1,250 mg Intravenous Q12H   Continuous Infusions: . dextrose 5 % and 0.9% NaCl 75 mL/hr at 12/14/14 1029  . sodium chloride 0.9 % 1,000 mL with potassium chloride 10 mEq infusion 100 mL/hr (12/14/14 2114)    Principal Problem:   Endocarditis Active Problems:    Essential hypertension   Non-compliance with treatment   Septic arthritis   Uncontrolled diabetes mellitus   Anemia   Hip pain    Time spent: 30 minutes.    Marcellus Scott, MD, FACP, FHM. [facility] Pager [phone]  If 7PM-7AM, please contact night-coverage [email or web address] Password [email or web address] 12/16/2014, 7:09 PM    LOS: 4 days

## 2014-12-17 DIAGNOSIS — E162 Hypoglycemia, unspecified: Secondary | ICD-10-CM

## 2014-12-17 LAB — GLUCOSE, CAPILLARY
GLUCOSE-CAPILLARY: 64 mg/dL — AB (ref 65–99)
GLUCOSE-CAPILLARY: 147 mg/dL — AB (ref 65–99)
Glucose-Capillary: 113 mg/dL — ABNORMAL HIGH (ref 65–99)
Glucose-Capillary: 107 mg/dL — ABNORMAL HIGH (ref 65–99)
Glucose-Capillary: 127 mg/dL — ABNORMAL HIGH (ref 65–99)

## 2014-12-17 LAB — CBC
HCT: 25.5 % — ABNORMAL LOW (ref 39.0–52.0)
Hemoglobin: 8.1 g/dL — ABNORMAL LOW (ref 13.0–17.0)
MCH: 25.8 pg — AB (ref 26.0–34.0)
MCHC: 31.8 g/dL (ref 30.0–36.0)
MCV: 81.2 fL (ref 78.0–100.0)
Platelets: 402 10*3/uL — ABNORMAL HIGH (ref 150–400)
RBC: 3.14 MIL/uL — AB (ref 4.22–5.81)
RDW: 15.3 % (ref 11.5–15.5)
WBC: 11.8 10*3/uL — ABNORMAL HIGH (ref 4.0–10.5)

## 2014-12-17 LAB — BASIC METABOLIC PANEL
Anion gap: 6 (ref 5–15)
BUN: 11 mg/dL (ref 6–20)
CO2: 30 mmol/L (ref 22–32)
Calcium: 8.4 mg/dL — ABNORMAL LOW (ref 8.9–10.3)
Chloride: 103 mmol/L (ref 101–111)
Creatinine, Ser: 0.77 mg/dL (ref 0.61–1.24)
GLUCOSE: 97 mg/dL (ref 65–99)
POTASSIUM: 3.8 mmol/L (ref 3.5–5.1)
SODIUM: 139 mmol/L (ref 135–145)

## 2014-12-17 MED ORDER — OXYCODONE HCL 5 MG PO TABS: 5.0000 mg | ORAL_TABLET | ORAL | Status: DC | PRN

## 2014-12-17 MED ORDER — ACETAMINOPHEN 325 MG PO TABS: 650.0000 mg | ORAL_TABLET | Freq: Four times a day (QID) | ORAL | Status: DC | PRN

## 2014-12-17 MED ORDER — INSULIN GLARGINE 100 UNIT/ML ~~LOC~~ SOLN
25.0000 [IU] | Freq: Every day | SUBCUTANEOUS | Status: DC
  Filled 2014-12-17: qty 0.25

## 2014-12-17 MED ORDER — DOCUSATE SODIUM 100 MG PO CAPS: 100.0000 mg | ORAL_CAPSULE | Freq: Two times a day (BID) | ORAL | Status: DC

## 2014-12-17 MED ORDER — FERROUS SULFATE 325 (65 FE) MG PO TABS: 325.0000 mg | ORAL_TABLET | Freq: Three times a day (TID) | ORAL | Status: DC

## 2014-12-17 MED ORDER — INSULIN GLARGINE 100 UNIT/ML ~~LOC~~ SOLN
20.0000 [IU] | Freq: Every day | SUBCUTANEOUS | Status: DC
  Administered 2014-12-17: 20 [IU] via SUBCUTANEOUS
  Filled 2014-12-17: qty 0.2

## 2014-12-17 MED ORDER — ASPIRIN 325 MG PO TBEC: 325.0000 mg | DELAYED_RELEASE_TABLET | Freq: Two times a day (BID) | ORAL | Status: AC

## 2014-12-17 MED ORDER — INSULIN ASPART 100 UNIT/ML ~~LOC~~ SOLN
0.0000 [IU] | Freq: Three times a day (TID) | SUBCUTANEOUS | Status: DC
  Administered 2014-12-17 – 2014-12-18 (×2): 1 [IU] via SUBCUTANEOUS

## 2014-12-17 NOTE — Progress Notes (Signed)
CBG: 64  Treatment: 15 GM carbohydrate snack (Eating Breakfast)  Symptoms: None  Follow-up CBG: Time: 0821 (after breakfast) CBG Result:113  Possible Reasons for Event: Medication regimen? Meds adjusted again.

## 2014-12-17 NOTE — Progress Notes (Signed)
Physical Therapy Treatment Patient Details Name: Marc [name] MRN: [id number] DOB: [birth year] Today's Date: 12/17/2014    History of Present Illness Marc [name] is a 54 y.o. 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 An I+D was performed in the ED, and he was placed on IV Vancomycin and Orhtopedics. S/P placement of antibiotic laden cemented THR    PT Comments    Patient  Continues to put forth effort to mobilize, working through discomfort of hip. Will try EVA walker to facilitate getting posture more upright and facilitate moving LLE when walking.  Follow Up Recommendations  SNF;Supervision/Assistance - 24 hour     Equipment Recommendations  Rolling walker with 5" wheels    Recommendations for Other Services       Precautions / Restrictions Precautions Precautions: Fall;Posterior Hip Precaution Comments: recalled 1/3 thps today Restrictions LLE Weight Bearing: Partial weight bearing LLE Partial Weight Bearing Percentage or Pounds: 50  Incontinent of urine.   Mobility  Bed Mobility Overal bed mobility: Needs Assistance Bed Mobility: Supine to Sit     Sit to supine: Max assist;+2 for physical assistance;+2 for safety/equipment   General bed mobility comments: assist to lift both legs onto bed  Transfers Overall transfer level: Needs assistance Equipment used: Rolling walker (2 wheeled) Transfers: Sit to/from [employer] Sit to Stand: Mod assist;+2 physical assistance;From elevated surface;+2  safety/equipment Stand pivot transfers: +2 physical assistance;+2 safety/equipment       General transfer comment: multimodal cues for  posture, to keep RW close to use UE maximally  Ambulation/Gait Assistive device: Rolling walker (2 wheeled) Gait Pattern/deviations: Step-to pattern;Decreased step length - right;Decreased stance time - right     General Gait Details: required assist to advance the L eft leg every step, much extra time to ambulate, cues for posture.   Stairs            Wheelchair Mobility    Modified Rankin (Stroke Patients Only)       Balance Overall balance assessment: Needs assistance Sitting-balance support: Bilateral upper extremity supported;Feet supported Sitting balance-Leahy Scale: Fair Sitting balance - Comments: relies on UE for support due to decreased tolerance of weight on L hip.   Standing balance support: During functional activity;Bilateral upper extremity supported;Single extremity supported Standing balance-Leahy Scale: Poor Standing balance comment: is able to balance on R leg to assist with self care for wash up,                     Cognition Arousal/Alertness: Awake/alert                          Exercises      General Comments        Pertinent Vitals/Pain Pain Score: 8  Pain Location: L hio and tailbone Pain Descriptors / Indicators: Aching;Contraction Pain Intervention(s): Limited activity within patient's tolerance;Monitored during session;RN gave pain meds during session;Repositioned;Ice applied    Home Living  Prior Function            PT Goals (current goals can now be found in the care plan section) Progress towards PT goals: Progressing toward goals    Frequency  Min 6X/week    PT Plan Current plan remains appropriate    Co-evaluation     End of Session Equipment Utilized During Treatment: Gait belt Activity Tolerance: Patient limited by  lethargy;Patient limited by pain;Patient limited by fatigue Patient left: in bed;with call bell/phone within reach     Time: 1356-1430 PT Time Calculation (min) (ACUTE ONLY): 34 min  Charges:  $Gait Training: 8-22 mins $Therapeutic Activity: 23-37 mins                    G Codes:      Rada Hay 12/17/2014, 4:19 PM Blanchard Kelch PT [phone]

## 2014-12-17 NOTE — Progress Notes (Signed)
PROGRESS NOTE    Marc [name] ZOX:[id number] DOB: Jun 14, 1960 DOA: 12/11/2014 PCP: No PCP Per Patient  HPI/Brief narrative 54 y.o. male with a history of Uncontrolled DM2, medical noncompliance who had been found to havepresumed Septic Emboli and an MRSA bacteremia and was to have a 6 week course of antibiotic rx and Rehab Ctr placement but left AMA on 12/10/2014, and returns to the ED with complaints of increased left hip pain and difficulty walking due to the pain x 3 days. 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 An I+D was performed in the ED, and he was placed on IV Vancomycin and Orhtopedics was consulted. Patient eventually underwent resection of left hip joint and placement of temporary antibiotic laden cemented total hip replacement on 7/25.   Assessment/Plan:  Septic arthritis of left hip - Orthopedics was consulted and patient underwent resection of left hip joint and placement of temporary antibiotic laden cemented total hip replacement on 7/25 - Infectious disease was consulted by orthopedics on 7/27- continue vancomycin. Rifampin added. - Operative wound cultures: Negative to date. Blood cultures 27/22: Negative to date  Recent MRSA bacteremia/presumed endocarditis - Continue IV vancomycin and oral rifampin   Septic pulmonary emboli - Continue vancomycin/rifampin  Uncontrolled DM 2 with hypoglycemia - Hemoglobin A1c 12.1 - Oral hypoglycemic was discontinued 7/27. Lantus was also reduced from 35-30 units at bedtime but patient had hypoglycemic event this morning of 64 mg per DL. We'll reduce Lantus to 20 units HS and monitor.  Anemia  - Stable.  Essential hypertension - Controlled  DVD prophylaxis: Aspirin 325 twice a day per orthopedics Code Status: Full Family Communication: None at bedside Disposition Plan: DC to SNF when blood sugar stable. Possibly  7/29   Consultants:  Orthopedics  ID  Procedures:  resection of left hip joint and placement of temporary antibiotic laden cemented total hip replacement on 7/25  Antibiotics:  IV vancomycin  Oral Rifampin  Subjective: Denies complaints.   Objective: Filed Vitals:   12/16/14 0855 12/16/14 2214 12/17/14 0614 12/17/14 1530  BP: 164/86 150/64 135/80 132/71  Pulse:  107 95 92  Temp:  99.4 F (37.4 C) 99.8 F (37.7 C) 98.7 F (37.1 C)  TempSrc:  Oral Oral Oral  Resp:  16 16 15   Height:      Weight:      SpO2:  96% 96% 97%    Intake/Output Summary (Last 24 hours) at 12/17/14 1818 Last data filed at 12/17/14 1700  Gross per 24 hour  Intake   1400 ml  Output    300 ml  Net   1100 ml   Filed Weights   12/12/14 0400  Weight: 77.111 kg (170 lb)     Exam:  General exam: Pleasant middle-aged male sitting up comfortably in bed this morning. Respiratory system: Clear. No increased work of breathing. Cardiovascular system: S1 & S2 heard, RRR. No JVD, murmurs, gallops, clicks or pedal edema. Gastrointestinal system: Abdomen is nondistended, soft and nontender. Normal bowel sounds heard. Central nervous system: Alert and oriented. No focal neurological deficits. Extremities: Symmetric 5 x 5 power. Left hip surgical site dressing intact.   Data Reviewed: Basic Metabolic Panel:  Recent Labs Lab 12/12/14 0525 12/14/14 0420 12/15/14 0250 12/16/14 0500 12/17/14 0535  NA 134* 140 137 139 139  K 4.1 3.6 4.3 3.6 3.8  CL 102 105 103 103 103  CO2 27 28 27  29  30  GLUCOSE 264* 81 288* 94 97  BUN CREATININE 0.96 0.80 0.98 0.85 0.77  CALCIUM 8.4* 8.4* 8.2* 8.2* 8.4*   Liver Function Tests: No results for input(s): AST, ALT, ALKPHOS, BILITOT, PROT, ALBUMIN in the last 168 hours. No results for input(s): LIPASE, AMYLASE in the last 168 hours. No results for input(s): AMMONIA in the last 168 hours. CBC:  Recent Labs Lab 12/11/14 1520 12/12/14 0525  12/14/14 0420 12/15/14 0250 12/16/14 0500 12/17/14 0535  WBC 13.6* 12.2* 14.8* 13.0* 15.4* 11.8*  NEUTROABS 11.0*  --  11.8*  --   --   --   HGB 8.6* 8.5* 8.0* 8.8* 8.0* 8.1*  HCT 28.0* 27.3* 25.6* 27.6* 25.3* 25.5*  MCV 80.7 81.0 80.0 81.7 80.6 81.2  PLT 480* 486* 472* 384 343 402*   Cardiac Enzymes: No results for input(s): CKTOTAL, CKMB, CKMBINDEX, TROPONINI in the last 168 hours. BNP (last 3 results) No results for input(s): PROBNP in the last 8760 hours. CBG:  Recent Labs Lab 12/16/14 2223 12/17/14 0723 12/17/14 0821 12/17/14 1132 12/17/14 1620  GLUCAP 243* 64* 113* 107* 127*    Recent Results (from the past 240 hour(s))  Blood culture (routine x 2)     Status: None   Collection Time: 12/08/14  6:09 PM  Result Value Ref Range Status   Specimen Description BLOOD LEFT HAND  Final   Special Requests BOTTLES DRAWN AEROBIC AND ANAEROBIC 5CC EACH  Final   Culture   Final    NO GROWTH 5 DAYS Performed at [facility]    Report Status 12/13/2014 FINAL  Final  Blood culture (routine x 2)     Status: None   Collection Time: 12/08/14  6:30 PM  Result Value Ref Range Status   Specimen Description BLOOD L HAND  Final   Special Requests BOTTLES DRAWN AEROBIC AND ANAEROBIC 5CC  Final   Culture   Final    NO GROWTH 5 DAYS Performed at [facility]    Report Status 12/13/2014 FINAL  Final  Blood culture (routine x 2)     Status: None   Collection Time: 12/11/14  3:15 PM  Result Value Ref Range Status   Specimen Description BLOOD RIGHT ANTECUBITAL  Final   Special Requests BOTTLES DRAWN AEROBIC AND ANAEROBIC 5CC  Final   Culture   Final    NO GROWTH 5 DAYS Performed at [facility]    Report Status 12/16/2014 FINAL  Final  Blood culture (routine x 2)     Status: None   Collection Time: 12/11/14  3:22 PM  Result Value Ref Range Status   Specimen Description BLOOD RIGHT HAND  Final   Special Requests BOTTLES DRAWN AEROBIC AND ANAEROBIC 4CC  Final    Culture   Final    NO GROWTH 5 DAYS Performed at [facility]    Report Status 12/16/2014 FINAL  Final  Body fluid culture     Status: None   Collection Time: 12/11/14 10:51 PM  Result Value Ref Range Status   Specimen Description SYNOVIAL  Final   Special Requests NONE  Final   Gram Stain   Final    WBC PRESENT, PREDOMINANTLY PMN NO ORGANISMS SEEN    Culture   Final    NO GROWTH 3 DAYS Performed at [facility]    Report Status 12/15/2014 FINAL  Final  Surgical pcr screen     Status: Abnormal   Collection  Time: 12/14/14  6:40 AM  Result Value Ref Range Status   MRSA, PCR POSITIVE (A) NEGATIVE Final   Staphylococcus aureus POSITIVE (A) NEGATIVE Final    Comment:        The Xpert SA Assay (FDA approved for NASAL specimens in patients over 18 years of age), is one component of a comprehensive surveillance program.  Test performance has been validated by [facility] for patients greater than or equal to 21 year old. It is not intended to diagnose infection nor to guide or monitor treatment. CRITICAL RESULT CALLED TO, READ BACK BY AND VERIFIED WITH: S.WHITTMORE AT 0820 ON 12/14/14 BY S.VANHOORNE   Anaerobic culture     Status: None (Preliminary result)   Collection Time: 12/14/14  4:31 PM  Result Value Ref Range Status   Specimen Description WOUND  Final   Special Requests NONE  Final   Gram Stain   Final    ABUNDANT WBC PRESENT,BOTH PMN AND MONONUCLEAR NO ORGANISMS SEEN    Culture   Final    NO ANAEROBES ISOLATED; CULTURE IN PROGRESS FOR 5 DAYS Performed at [facility]    Report Status PENDING  Incomplete  Body fluid culture     Status: None (Preliminary result)   Collection Time: 12/14/14  4:31 PM  Result Value Ref Range Status   Specimen Description   Final    HIP LEFT        Lymphocytosis. If a lymphoproliferative disorder is in the clinical differential diagnosis, fresh unrefrigerated    Special Requests PATIENT ON FOLLOWING  VANCOMYCIN  Final   Gram Stain   Final    ABUNDANT WBC PRESENT,BOTH PMN AND MONONUCLEAR NO ORGANISMS SEEN    Culture   Final    NO GROWTH 3 DAYS Performed at [facility]    Report Status PENDING  Incomplete  Tissue culture     Status: None (Preliminary result)   Collection Time: 12/14/14  5:04 PM  Result Value Ref Range Status   Specimen Description HIP LEFT FEMORAL HEAD  Final   Special Requests PATIENT ON FOLLOWING VANCOMYCIN  Final   Gram Stain   Final    RARE WBC PRESENT,BOTH PMN AND MONONUCLEAR NO ORGANISMS SEEN Performed at [facility]    Culture   Final    NO GROWTH 2 DAYS Performed at [facility]    Report Status PENDING  Incomplete  Anaerobic culture     Status: None (Preliminary result)   Collection Time: 12/14/14  5:17 PM  Result Value Ref Range Status   Specimen Description HIP LEFT FEMORAL HEAD  Final   Special Requests NONE  Final   Gram Stain   Final    FEW WBC PRESENT,BOTH PMN AND MONONUCLEAR NO ORGANISMS SEEN Performed at [facility]    Culture   Final    NO ANAEROBES ISOLATED; CULTURE IN PROGRESS FOR 5 DAYS Performed at [facility]    Report Status PENDING  Incomplete          Studies: No results found.      Scheduled Meds: . amLODipine  10 mg Oral Daily  . aspirin EC  325 mg Oral BID  . celecoxib  200 mg Oral Q12H  . Chlorhexidine Gluconate Cloth  6 each Topical Q0600  . dexamethasone  10 mg Intravenous Once  . docusate sodium  100 mg Oral BID  . ferrous sulfate  325 mg Oral TID PC  . folic acid  1 mg  Oral Daily  . insulin aspart  0-9 Units Subcutaneous TID WC  . insulin glargine  25 Units Subcutaneous QHS  . lisinopril  5 mg Oral Daily  . multivitamin with minerals  1 tablet Oral Daily  . mupirocin ointment  1 application Nasal BID  . pantoprazole  40 mg Oral Daily  . polyethylene glycol  17 g Oral BID  . rifampin  300 mg Oral Daily  . sodium chloride  10-40 mL Intracatheter Q12H  .  vancomycin  1,250 mg Intravenous Q12H   Continuous Infusions:    Principal Problem:   Endocarditis Active Problems:   Essential hypertension   Non-compliance with treatment   Septic arthritis   Uncontrolled diabetes mellitus   Anemia   Hip pain    Time spent: 20 minutes.    Marcellus Scott, MD, FACP, FHM. [facility] Pager [phone]  If 7PM-7AM, please contact night-coverage [email or web address] Password [email or web address] 12/17/2014, 6:18 PM    LOS: 5 days

## 2014-12-17 NOTE — Progress Notes (Signed)
Physical Therapy Treatment Patient Details Name: Marc [name] MRN: [id number] DOB: December 07, 1960 Today's Date: 12/17/2014    History of Present Illness Marc [name] is a 54 y.o. 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 An I+D was performed in the ED, and he was placed on IV Vancomycin and Orhtopedics. S/P placement of antibiotic laden cemented THR    PT Comments    Patient  Incontinent of urine, gets really crosswise in the bed. Requires extra time for mobility due to discomfort of L  Hip, tends to keep Hip externally rotated and knee flexed. Encouraged patient to work on extending knee and rotating  L hip to neutral frequently.  Patient requires multimodal cues  And assist to advance LLE.  Follow Up Recommendations  SNF;Supervision/Assistance - 24 hour     Equipment Recommendations  Rolling walker with 5" wheels    Recommendations for Other Services       Precautions / Restrictions Precautions Precautions: Fall;Posterior Hip Precaution Comments: recalled 1/3 thps today Restrictions LLE Weight Bearing: Partial weight bearing LLE Partial Weight Bearing Percentage or Pounds: 50    Mobility  Bed Mobility Overal bed mobility: Needs Assistance Bed Mobility: Supine to Sit     Supine to sit: Mod assist;+2 for physical assistance;+2 for safety/equipment;HOB elevated     General bed mobility comments: used leg lifter and rails.  Assist for LLE and trunk  Transfers Overall transfer level: Needs assistance Equipment used: Rolling walker  (2 wheeled) Transfers: Sit to/from Stand Sit to Stand: Mod assist;+2 physical assistance;From elevated surface;+2 safety/equipment         General transfer comment: assist to rise and stabilize.  Pt in flexed posture initially but able to straighten out with extra time  Ambulation/Gait Ambulation/Gait assistance: Mod assist;+2 physical assistance;+2 safety/equipment Ambulation Distance (Feet): 20 Feet Assistive device: Rolling walker (2 wheeled) Gait Pattern/deviations: Step-to pattern;Decreased step length - right;Decreased stance time - right     General Gait Details: required assist to advance the L eft leg every step, much extra time to ambulate, cues for posture. Assist to advance LLE.   Stairs            Wheelchair Mobility    Modified Rankin (Stroke Patients Only)       Balance Overall balance assessment: Needs assistance Sitting-balance support: Bilateral upper extremity supported;Feet supported Sitting balance-Leahy Scale: Fair Sitting balance - Comments: relies on UE for support due to decreased tolerance of weight on L hip.   Standing balance support: During functional activity;Bilateral upper extremity supported;Single extremity supported Standing balance-Leahy Scale: Poor Standing balance comment: is able to balance on R leg to assist with self care for wash up,                     Cognition Arousal/Alertness: Awake/alert                          Exercises      General Comments  Pertinent Vitals/Pain Pain Score: 4  Pain Location: L thigh and hip when moved Pain Descriptors / Indicators: Aching;Stabbing Pain Intervention(s): Limited activity within patient's tolerance;RN gave pain meds during session;Repositioned;Ice applied    Home Living                      Prior Function            PT Goals (current goals can now be found in the care plan section) Progress towards PT goals: Progressing toward goals     Frequency  Min 6X/week    PT Plan Frequency needs to be updated    Co-evaluation PT/OT/SLP Co-Evaluation/Treatment: Yes Reason for Co-Treatment: Complexity of the patient's impairments (multi-system involvement);For patient/therapist safety PT goals addressed during session: Mobility/safety with mobility OT goals addressed during session: ADL's and self-care     End of Session Equipment Utilized During Treatment: Gait belt Activity Tolerance: Patient tolerated treatment well Patient left: in chair;with call bell/phone within reach     Time: 5784-6962 PT Time Calculation (min) (ACUTE ONLY): 27 min  Charges:  $Gait Training: 8-22 mins                    G Codes:      Rada Hay 12/17/2014, 4:13 PM

## 2014-12-17 NOTE — Progress Notes (Signed)
INFECTIOUS DISEASE PROGRESS NOTE  ID: Marc [name] is a 54 y.o. male with  Principal Problem:   Endocarditis Active Problems:   Essential hypertension   Non-compliance with treatment   Septic arthritis   Uncontrolled diabetes mellitus   Anemia   Hip pain  Subjective: Without complaints  Abtx:  Anti-infectives    Start     Dose/Rate Route Frequency Ordered Stop   12/16/14 1830  rifampin (RIFADIN) capsule 300 mg     300 mg Oral Daily 12/16/14 1821     12/14/14 2000  ceFAZolin (ANCEF) IVPB 2 g/50 mL premix     2 g 100 mL/hr over 30 Minutes Intravenous Every 6 hours 12/14/14 1930 12/15/14 0211   12/14/14 1657  tobramycin (NEBCIN) powder  Status:  Discontinued       As needed 12/14/14 1658 12/14/14 1742   12/14/14 1656  vancomycin (VANCOCIN) powder  Status:  Discontinued       As needed 12/14/14 1657 12/14/14 1742   12/12/14 1600  vancomycin (VANCOCIN) 1,250 mg in sodium chloride 0.9 % 250 mL IVPB     1,250 mg 166.7 mL/hr over 90 Minutes Intravenous Every 12 hours 12/12/14 1404     12/12/14 0600  vancomycin (VANCOCIN) IVPB 1000 mg/200 mL premix  Status:  Discontinued     1,000 mg 200 mL/hr over 60 Minutes Intravenous Every 12 hours 12/12/14 0315 12/12/14 1404   12/11/14 1530  vancomycin (VANCOCIN) 1,500 mg in sodium chloride 0.9 % 500 mL IVPB     1,500 mg 250 mL/hr over 120 Minutes Intravenous  Once 12/11/14 1500 12/11/14 1809      Medications:  Scheduled: . amLODipine  10 mg Oral Daily  . aspirin EC  325 mg Oral BID  . celecoxib  200 mg Oral Q12H  . Chlorhexidine Gluconate Cloth  6 each Topical Q0600  . dexamethasone  10 mg Intravenous Once  . docusate sodium  100 mg Oral BID  . ferrous sulfate  325 mg Oral TID PC  . folic acid  1 mg Oral Daily  . insulin aspart  0-9 Units Subcutaneous TID WC  . insulin glargine  25 Units Subcutaneous QHS  . lisinopril  5 mg Oral Daily  . multivitamin with minerals  1 tablet Oral Daily  . mupirocin ointment  1 application  Nasal BID  . pantoprazole  40 mg Oral Daily  . polyethylene glycol  17 g Oral BID  . rifampin  300 mg Oral Daily  . sodium chloride  10-40 mL Intracatheter Q12H  . vancomycin  1,250 mg Intravenous Q12H    Objective: Vital signs in last 24 hours: Temp:  [99.4 F (37.4 C)-99.8 F (37.7 C)] 99.8 F (37.7 C) (07/28 1610) Pulse Rate:  [95-107] 95 (07/28 0614) Resp:  [16] 16 (07/28 0614) BP: (135-150)/(64-80) 135/80 mmHg (07/28 0614) SpO2:  [96 %] 96 % (07/28 0614)   General appearance: alert, cooperative and no distress Resp: clear to auscultation bilaterally Cardio: regular rate and rhythm GI: normal findings: bowel sounds normal and soft, non-tender  Lab Results  Recent Labs  12/16/14 0500 12/17/14 0535  WBC 15.4* 11.8*  HGB 8.0* 8.1*  HCT 25.3* 25.5*  NA 139 139  K 3.6 3.8  CL 103 103  CO2 29 30  BUN 12 11  CREATININE 0.85 0.77   Liver Panel No results for input(s): PROT, ALBUMIN, AST, ALT, ALKPHOS, BILITOT, BILIDIR, IBILI in the last 72 hours. Sedimentation Rate No results for input(s): ESRSEDRATE in  the last 72 hours. C-Reactive Protein No results for input(s): CRP in the last 72 hours.  Microbiology: Recent Results (from the past 240 hour(s))  Blood culture (routine x 2)     Status: None   Collection Time: 12/08/14  6:09 PM  Result Value Ref Range Status   Specimen Description BLOOD LEFT HAND  Final   Special Requests BOTTLES DRAWN AEROBIC AND ANAEROBIC 5CC EACH  Final   Culture   Final    NO GROWTH 5 DAYS Performed at [facility]    Report Status 12/13/2014 FINAL  Final  Blood culture (routine x 2)     Status: None   Collection Time: 12/08/14  6:30 PM  Result Value Ref Range Status   Specimen Description BLOOD L HAND  Final   Special Requests BOTTLES DRAWN AEROBIC AND ANAEROBIC 5CC  Final   Culture   Final    NO GROWTH 5 DAYS Performed at [facility]    Report Status 12/13/2014 FINAL  Final  Blood culture (routine x 2)      Status: None   Collection Time: 12/11/14  3:15 PM  Result Value Ref Range Status   Specimen Description BLOOD RIGHT ANTECUBITAL  Final   Special Requests BOTTLES DRAWN AEROBIC AND ANAEROBIC 5CC  Final   Culture   Final    NO GROWTH 5 DAYS Performed at [facility]    Report Status 12/16/2014 FINAL  Final  Blood culture (routine x 2)     Status: None   Collection Time: 12/11/14  3:22 PM  Result Value Ref Range Status   Specimen Description BLOOD RIGHT HAND  Final   Special Requests BOTTLES DRAWN AEROBIC AND ANAEROBIC 4CC  Final   Culture   Final    NO GROWTH 5 DAYS Performed at [facility]    Report Status 12/16/2014 FINAL  Final  Body fluid culture     Status: None   Collection Time: 12/11/14 10:51 PM  Result Value Ref Range Status   Specimen Description SYNOVIAL  Final   Special Requests NONE  Final   Gram Stain   Final    WBC PRESENT, PREDOMINANTLY PMN NO ORGANISMS SEEN    Culture   Final    NO GROWTH 3 DAYS Performed at [facility]    Report Status 12/15/2014 FINAL  Final  Surgical pcr screen     Status: Abnormal   Collection Time: 12/14/14  6:40 AM  Result Value Ref Range Status   MRSA, PCR POSITIVE (A) NEGATIVE Final   Staphylococcus aureus POSITIVE (A) NEGATIVE Final    Comment:        The Xpert SA Assay (FDA approved for NASAL specimens in patients over 46 years of age), is one component of a comprehensive surveillance program.  Test performance has been validated by [facility] for patients greater than or equal to 57 year old. It is not intended to diagnose infection nor to guide or monitor treatment. CRITICAL RESULT CALLED TO, READ BACK BY AND VERIFIED WITH: S.WHITTMORE AT 0820 ON 12/14/14 BY S.VANHOORNE   Anaerobic culture     Status: None (Preliminary result)   Collection Time: 12/14/14  4:31 PM  Result Value Ref Range Status   Specimen Description WOUND  Final   Special Requests NONE  Final   Gram Stain   Final    ABUNDANT  WBC PRESENT,BOTH PMN AND MONONUCLEAR NO ORGANISMS SEEN    Culture   Final    NO  ANAEROBES ISOLATED; CULTURE IN PROGRESS FOR 5 DAYS Performed at [facility]    Report Status PENDING  Incomplete  Body fluid culture     Status: None (Preliminary result)   Collection Time: 12/14/14  4:31 PM  Result Value Ref Range Status   Specimen Description   Final    HIP LEFT        Lymphocytosis. If a lymphoproliferative disorder is in the clinical differential diagnosis, fresh unrefrigerated    Special Requests PATIENT ON FOLLOWING VANCOMYCIN  Final   Gram Stain   Final    ABUNDANT WBC PRESENT,BOTH PMN AND MONONUCLEAR NO ORGANISMS SEEN    Culture   Final    NO GROWTH 3 DAYS Performed at [facility]    Report Status PENDING  Incomplete  Tissue culture     Status: None (Preliminary result)   Collection Time: 12/14/14  5:04 PM  Result Value Ref Range Status   Specimen Description HIP LEFT FEMORAL HEAD  Final   Special Requests PATIENT ON FOLLOWING VANCOMYCIN  Final   Gram Stain   Final    RARE WBC PRESENT,BOTH PMN AND MONONUCLEAR NO ORGANISMS SEEN Performed at [facility]    Culture   Final    NO GROWTH 2 DAYS Performed at [facility]    Report Status PENDING  Incomplete  Anaerobic culture     Status: None (Preliminary result)   Collection Time: 12/14/14  5:17 PM  Result Value Ref Range Status   Specimen Description HIP LEFT FEMORAL HEAD  Final   Special Requests NONE  Final   Gram Stain   Final    FEW WBC PRESENT,BOTH PMN AND MONONUCLEAR NO ORGANISMS SEEN Performed at [facility]    Culture   Final    NO ANAEROBES ISOLATED; CULTURE IN PROGRESS FOR 5 DAYS Performed at [facility]    Report Status PENDING  Incomplete    Studies/Results: No results found.   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  Watch LFTS on rifampin Cx pending from OR,  his aspirate Cx are (-).  D/c to SNF in AM  Resection L hip Continue vanco/rifampin  Endocarditis, presumed Continue vanco/rifampin  Septic pulmonary emboli Continue vanco/rifampin He is asx  DM2 uncontrolled Needs better control Needs close f/u at d/c On ACE-I  Total days of antibiotics: day 2 vanco/rifampin  Please have him f/u in ID clinic in 4 weeks         Johny Sax Infectious Diseases (pager) [phone] [email or web address] 12/17/2014, 3:28 PM  LOS: 5 days

## 2014-12-17 NOTE — Progress Notes (Signed)
Occupational Therapy Treatment Patient Details Name: Marc [name] MRN: [id number] DOB: 09-11-1960 Today's Date: 12/17/2014    History of present illness Marc [name] is a 54 y.o. 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 An I+D was performed in the ED, and he was placed on IV Vancomycin and Orhtopedics. S/P placement of antibiotic laden cemented THR   OT comments  Pt is making progress with ADLs and recalling THPs; mobility progress is slow  Follow Up Recommendations  SNF    Equipment Recommendations  3 in 1 bedside comode    Recommendations for Other Services      Precautions / Restrictions Precautions Precautions: Fall;Posterior Hip Precaution Comments: recalled 1/3 thps today Restrictions LLE Weight Bearing: Partial weight bearing       Mobility Bed Mobility         Supine to sit: Mod assist;+2 for physical assistance;+2 for safety/equipment;HOB elevated     General bed mobility comments: used leg lifter and rails.  Assist for LLE and trunk  Transfers   Equipment used: Rolling walker (2 wheeled) Transfers: Sit to/from Stand Sit to Stand: Mod assist;+2 physical assistance;From elevated surface;+2 safety/equipment         General transfer comment: assist to rise and stabilize.  Pt in flexed posture initially but able to straighten out with extra time    Balance                                   ADL               Lower Body Bathing: Minimal assistance;Sit to/from stand;+2 for physical  assistance;Moderate assistance (mod A for sit to stand +2; min A for washing)           Toilet Transfer: Moderate assistance;+2 for physical assistance;Ambulation             General ADL Comments: performed bathing at EOB, sit to stand with long sponge.  Ambulated around bed to chair.  Continues to need A to advance LLE and cues for sequence.  +2 for safety; needs +2 physical A for sit to stand      Vision                     Perception     Praxis      Cognition   Behavior During Therapy: [facility] for tasks assessed/performed;Anxious Overall Cognitive Status: No family/caregiver present to determine baseline cognitive functioning                       Extremity/Trunk Assessment               Exercises     Shoulder Instructions       General Comments      Pertinent Vitals/ Pain       Faces Pain Scale: Hurts little more Pain Location: L hip/thigh with bed mobility Pain Descriptors / Indicators: Aching  Home Living Family/patient expects to be discharged to:: Skilled nursing facility Living Arrangements: Parent                                      Prior Functioning/Environment              Frequency Min 2X/week     Progress Toward Goals  OT Goals(current goals can now be found in the care plan section)  Progress towards OT goals: Progressing toward goals     Plan      Co-evaluation    PT/OT/SLP Co-Evaluation/Treatment: Yes Reason for Co-Treatment: For patient/therapist safety PT goals addressed during session: Mobility/safety with mobility OT goals addressed during session: ADL's and self-care;Proper use of Adaptive equipment and DME      End of Session     Activity Tolerance Patient tolerated treatment well   Patient Left in chair;with call bell/phone within reach   Nurse Communication          Time: 1610-9604 OT Time Calculation (min): 31 min  Charges: OT General Charges $OT Visit: 1  Procedure OT Treatments $Self Care/Home Management : 8-22 mins  Granvel Proudfoot 12/17/2014, 12:14 PM   Marica Otter, OTR/L [phone] 12/17/2014

## 2014-12-18 DIAGNOSIS — E1165 Type 2 diabetes mellitus with hyperglycemia: Secondary | ICD-10-CM

## 2014-12-18 DIAGNOSIS — Z5181 Encounter for therapeutic drug level monitoring: Secondary | ICD-10-CM

## 2014-12-18 LAB — BODY FLUID CULTURE: Culture: NO GROWTH

## 2014-12-18 LAB — HEPATIC FUNCTION PANEL
ALBUMIN: 2.3 g/dL — AB (ref 3.5–5.0)
ALT: 35 U/L (ref 17–63)
AST: 21 U/L (ref 15–41)
Alkaline Phosphatase: 157 U/L — ABNORMAL HIGH (ref 38–126)
Bilirubin, Direct: <0.1 mg/dL — ABNORMAL LOW (ref 0.1–0.5)
Total Bilirubin: 0.4 mg/dL (ref 0.3–1.2)
Total Protein: 7 g/dL (ref 6.5–8.1)

## 2014-12-18 LAB — TISSUE CULTURE: Culture: NO GROWTH

## 2014-12-18 LAB — GLUCOSE, CAPILLARY
GLUCOSE-CAPILLARY: 83 mg/dL (ref 65–99)
Glucose-Capillary: 142 mg/dL — ABNORMAL HIGH (ref 65–99)

## 2014-12-18 MED ORDER — INSULIN GLARGINE 100 UNIT/ML ~~LOC~~ SOLN: 15.0000 [IU] | Freq: Every day | SUBCUTANEOUS | Status: DC

## 2014-12-18 MED ORDER — HEPARIN SOD (PORK) LOCK FLUSH 100 UNIT/ML IV SOLN
250.0000 [IU] | INTRAVENOUS | Status: AC | PRN
  Administered 2014-12-18: 250 [IU]

## 2014-12-18 MED ORDER — VANCOMYCIN HCL 10 G IV SOLR: 1250.0000 mg | Freq: Two times a day (BID) | INTRAVENOUS | Status: DC

## 2014-12-18 MED ORDER — BISACODYL 10 MG RE SUPP: 10.0000 mg | Freq: Every day | RECTAL | Status: DC | PRN

## 2014-12-18 MED ORDER — POLYETHYLENE GLYCOL 3350 17 G PO PACK: 17.0000 g | PACK | Freq: Two times a day (BID) | ORAL | Status: DC

## 2014-12-18 MED ORDER — RIFAMPIN 300 MG PO CAPS: 300.0000 mg | ORAL_CAPSULE | Freq: Every day | ORAL | Status: DC

## 2014-12-18 MED ORDER — INSULIN ASPART 100 UNIT/ML ~~LOC~~ SOLN: 0.0000 [IU] | Freq: Three times a day (TID) | SUBCUTANEOUS | Status: DC

## 2014-12-18 NOTE — Discharge Summary (Signed)
Physician Discharge Summary  Marc [name] IDP:[id number] DOB: 1960-09-09 DOA: 12/11/2014  PCP: No PCP Per Patient  Admit date: 12/11/2014 Discharge date: 12/18/2014  Time spent: Greater than 30 minutes  Recommendations for Outpatient Follow-up:  1. Dr. Paralee Cancel, Orthopedics in 2 weeks. SNF to arrange for follow-up appointment. 2. Dr. Bobby Rumpf, Infectious Disease in 4 weeks. SNF to arrange for follow-up appointment. 3. Complete antibiotics (IV vancomycin and oral rifampin) on 01/26/2015. 4. Weekly labs (CBC, CMP & vancomycin trough). Next lab draw 12/24/2014. Vancomycin dose to be adjusted as per pharmacy. Vancomycin trough 12/15/14:15 5. M.D. at SNF in 3-5 days. Please follow final left hip operative culture results which are thus far negative. 6. PICC line to be removed as per discretion of infectious disease. 7. Recommend outpatient urology consultation-see below    Discharge Diagnoses:  Principal Problem:   Endocarditis Active Problems:   Essential hypertension   Non-compliance with treatment   Septic arthritis   Uncontrolled diabetes mellitus   Anemia   Hip pain   Hypoglycemia   Discharge Condition: Improved & Stable  Diet recommendation: Heart healthy and diabetic diet.  Filed Weights   12/12/14 0400  Weight: 77.111 kg (170 lb)    History of present illness:  54 year old male with history of DM 2, HTN, admitted to [facility] 11/16/14-11/22/14 with DKA, CAP & UTI/pyelonephritis. His course was complicated by obstructive uropathy and he was discharged with Foley catheter. His chest x-ray showed LUL and right basal edema versus multifocal infiltrate. He was treated with vancomycin and Zosyn in the hospital and discharged home on levofloxacin. On 11/25/14, he was found to have blood cultures positive for Staphylococcus aureus. He was called to return to hospital. He returned on 11/30/14 to have his Foley removed, still having fevers and uncontrolled CBGs.  His chest x-ray showed multiple nodules. He was again started on vancomycin and Zosyn. CT chest showed multiple pulmonary nodules bilaterally, many with cavitation. Appearance compatible with septic emboli. He had BAL which showed mixed flora and TTE was negative. He did not undergo TEE. He was discharged home on 12/04/14 to remain on IV vancomycin for 6 weeks (end date 01/10/15). He returned on 12/08/14 with low-grade fever at home and fatigue. He left AMA on 12/10/14. He returned on 12/11/14 with left hip pain and difficulty walking. MRI of left hip showed septic arthritis. IV vancomycin was restarted and rifampicin was added. Infectious disease consulted. Orthopedics was consulted and he underwent resection of his left hip and placement of antibiotic laden cemented total hip replacement on 12/14/14. Orthopedics and infectious disease have cleared him for discharge to SNF on ongoing antibiotics and they will see him and outpatient follow-up.  Hospital Course:   Left hip septic arthritis - Orthopedics was consulted and patient underwent resection of his left hip and placement of antibiotic laden cemented total hip replacement on 12/04/14. As per orthopedics, partial weightbearing on left hip, aspirin 325 MG twice a day 30 days for postop DVT prophylaxis, continue Aquacel dressing which was placed on 12/18/14 until follow-up with outpatient orthopedics in 2 weeks. - Infectious disease was consulted and recommend continued IV vancomycin via PICC line and oral rifampin, both to complete on 01/26/2015. Patient to follow-up in infectious disease clinic in 4 weeks.  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 He is asymptomatic.  Uncontrolled type II DM - Hemoglobin A1c recently  in the 12 range. - Patient was started on Lantus 35 units at bedtime and sliding scale insulin. He  was also continued on oral hypoglycemics initially. Patient had hypoglycemic range CBG early 7/28 morning despite discontinuing oral hypoglycemic and reducing Lantus dose. Lantus was further reduced to 20 units at bedtime on 7/28. He has no further hypoglycemic episodes. However his CBG this morning is 83 mg per DL. His acute infection was probably driving hyperglycemia and with improvement in infection his blood sugars are starting to improve. We will reduce Lantus to 15 units daily at bedtime beginning tonight and continue NovoLog SSI. Monitor CBGs closely at SNF and make necessary adjustments.  Essential hypertension - Controlled on lisinopril and amlodipine   Anemia  - stable - Follow CBC periodically at SNF  History of obstructive uropathy during recent prior hospitalization/? Prostatitis with abscess on MRI 7/22 - He is asymptomatic of urinary symptoms. He is already on above Abx - Recommend outpatient urology consultation for further evaluation.    Consultations:  Orthopedics  Infectious disease  Interventional radiology  Procedures:  resection of left hip joint and placement of temporary antibiotic laden cemented total hip replacement on 7/25  Aspiration of the left hip joint under fluoroscopy by IR on 7/23.   Discharge Exam:  Complaints: Denies complaints.   Filed Vitals:   12/17/14 0614 12/17/14 1530 12/17/14 2148 12/18/14 0440  BP: 135/80 132/71 144/75 143/81  Pulse: 95 92 96 95  Temp: 99.8 F (37.7 C) 98.7 F (37.1 C) 99.8 F (37.7 C) 98.3 F (36.8 C)  TempSrc: Oral Oral Oral Oral  Resp: 16 15 16 16   Height:      Weight:      SpO2: 96% 97% 97% 100%    General exam: Pleasant middle-aged male sitting up comfortably in bed this morning. Respiratory system: Clear. No increased work of breathing. Cardiovascular system: S1 & S2 heard, RRR. No JVD, murmurs, gallops, clicks or pedal edema. Gastrointestinal system: Abdomen is nondistended, soft and nontender.  Normal bowel sounds heard. Central nervous system: Alert and oriented. No focal neurological deficits. Extremities: Symmetric 5 x 5 power. Left hip surgical site dressing intact.  Discharge Instructions      Discharge Instructions    Call MD for:  difficulty breathing, headache or visual disturbances    Complete by:  As directed      Call MD for:  extreme fatigue    Complete by:  As directed      Call MD for:  hives    Complete by:  As directed      Call MD for:  persistant dizziness or light-headedness    Complete by:  As directed      Call MD for:  persistant nausea and vomiting    Complete by:  As directed      Call MD for:  redness, tenderness, or signs of infection (pain, swelling, redness, odor or green/yellow discharge around incision site)    Complete by:  As directed      Call MD for:  severe uncontrolled pain    Complete by:  As directed      Call MD for:  temperature >100.4    Complete by:  As directed      Diet - low sodium heart healthy    Complete by:  As directed      Diet Carb Modified    Complete by:  As directed      Discharge wound care:    Complete  by:  As directed   Left hip surgical site dressing changed 12/17/14 to Aquacel dressing, may remain in place for 2 weeks.     Increase activity slowly    Complete by:  As directed      Partial weight bearing    Complete by:  As directed   % Body Weight:  50  Laterality:  left  Extremity:  Lower            Medication List    STOP taking these medications        glimepiride 2 MG tablet  Commonly known as:  AMARYL     glucose blood test strip     linezolid 600 MG tablet  Commonly known as:  ZYVOX     tamsulosin 0.4 MG Caps capsule  Commonly known as:  FLOMAX     TRUE METRIX METER W/DEVICE Kit     TRUEPLUS LANCETS 30G Misc     vancomycin 1 GM/200ML Soln  Commonly known as:  VANCOCIN      TAKE these medications        acetaminophen 325 MG tablet  Commonly known as:  TYLENOL  Take 2 tablets  (650 mg total) by mouth every 6 (six) hours as needed for mild pain (or Fever >/= 101).     alum & mag hydroxide-simeth 200-200-20 MG/5ML suspension  Commonly known as:  MAALOX/MYLANTA  Take 30 mLs by mouth every 6 (six) hours as needed for indigestion or heartburn.     amLODipine 10 MG tablet  Commonly known as:  NORVASC  Take 1 tablet (10 mg total) by mouth daily.     aspirin 325 MG EC tablet  Take 1 tablet (325 mg total) by mouth 2 (two) times daily.     bisacodyl 10 MG suppository  Commonly known as:  DULCOLAX  Place 1 suppository (10 mg total) rectally daily as needed for moderate constipation.     docusate sodium 100 MG capsule  Commonly known as:  COLACE  Take 1 capsule (100 mg total) by mouth 2 (two) times daily.     ferrous sulfate 325 (65 FE) MG tablet  Take 1 tablet (325 mg total) by mouth 3 (three) times daily after meals.     folic acid 1 MG tablet  Commonly known as:  FOLVITE  Take 1 tablet (1 mg total) by mouth daily.     insulin aspart 100 UNIT/ML injection  Commonly known as:  novoLOG  Inject 0-9 Units into the skin 3 (three) times daily with meals. CBG < 70: implement hypoglycemia protocol CBG 70 - 120: 0 units CBG 121 - 150: 1 unit CBG 151 - 200: 2 units CBG 201 - 250: 3 units CBG 251 - 300: 5 units CBG 301 - 350: 7 units CBG 351 - 400: 9 units CBG > 400: call MD.     insulin glargine 100 UNIT/ML injection  Commonly known as:  LANTUS  Inject 0.15 mLs (15 Units total) into the skin at bedtime.     lisinopril 5 MG tablet  Commonly known as:  PRINIVIL,ZESTRIL  Take 1 tablet (5 mg total) by mouth daily.     multivitamin with minerals Tabs tablet  Take 1 tablet by mouth daily.     oxyCODONE 5 MG immediate release tablet  Commonly known as:  Oxy IR/ROXICODONE  Take 1-2 tablets (5-10 mg total) by mouth every 4 (four) hours as needed for moderate pain.     pantoprazole 40 MG tablet  Commonly known as:  PROTONIX  Take 1 tablet (40 mg total) by mouth daily.      polyethylene glycol packet  Commonly known as:  MIRALAX / GLYCOLAX  Take 17 g by mouth 2 (two) times daily.     rifampin 300 MG capsule  Commonly known as:  RIFADIN  Take 1 capsule (300 mg total) by mouth daily. Complete on January 26, 2015.     thiamine 100 MG tablet  Take 1 tablet (100 mg total) by mouth daily.     vancomycin 1,250 mg in sodium chloride 0.9 % 250 mL  Inject 1,250 mg into the vein every 12 (twelve) hours. Complete on January 26, 2015.       Follow-up Information    Follow up with Mauri Pole, MD. Schedule an appointment as soon as possible for a visit in 2 weeks.   Specialty:  Orthopedic Surgery   Contact information:   [address] [place] [place] [address] [phone]        The results of significant diagnostics from this hospitalization (including imaging, microbiology, ancillary and laboratory) are listed below for reference.    Significant Diagnostic Studies: Dg Chest 2 View  11/30/2014   CLINICAL DATA:  Recently hospitalized with urinary tract infection. Fever. History of diabetes. Initial encounter.  EXAM: CHEST  2 VIEW  COMPARISON:  11/21/2014 and 11/18/2014 radiographs.  FINDINGS: The heart size and mediastinal contours are stable without definite adenopathy. There is overall improved aeration of the left lung base. However, the patient has developed ill-defined nodular densities in both lungs worrisome for possible septic emboli. No significant pleural effusion. The bones appear unchanged.  IMPRESSION: Interval increased nodularity to the previously demonstrated bilateral airspace opacities worrisome for developing septic emboli. No significant pleural effusion.  Further evaluation with chest CT recommended (preferably with contrast if possible).   Electronically Signed   By: Richardean Sale M.D.   On: 11/30/2014 21:16   Ct Chest W Contrast  11/30/2014   CLINICAL DATA:  Recent diagnosis of urinary tract infection 2 weeks ago.  Now developing right lateral chest pain since this morning. Plain radiographs demonstrated increase nodular infiltrates.  EXAM: CT CHEST WITH CONTRAST  TECHNIQUE: Multidetector CT imaging of the chest was performed during intravenous contrast administration.  CONTRAST:  2m OMNIPAQUE IOHEXOL 300 MG/ML  SOLN  COMPARISON:  Chest 11/30/2014  FINDINGS: Multiple diffuse nodular areas of consolidation throughout both lungs, many with cavitation. Largest lesion is present in the superior segment left lower lobe and measures about 1.8 x 2.3 cm. Appearance is compatible with septic emboli although nonspecific. Short-term follow-up after resolution of acute process is recommended to exclude metastasis. Differential diagnosis would also include Wegner's granulomatosis or atypical infection such as TB or fungal infection. Small right pleural effusion. No pneumothorax.  Normal heart size. Normal caliber thoracic aorta. Scattered mediastinal and hilar lymph nodes as well as axillary lymph nodes are present without pathologic enlargement. These are likely reactive. Small esophageal hiatal hernia. Esophagus is mostly decompressed.  Included portions of the upper abdominal organs are grossly unremarkable. No destructive bone lesions.  IMPRESSION: Multiple pulmonary nodules bilaterally, many with cavitation. Appearance is compatible with septic emboli although differential diagnosis would include metastasis, TB or fungal infection. Small right pleural effusion.   Electronically Signed   By: WLucienne CapersM.D.   On: 11/30/2014 22:45   Mr Hip Left W Wo Contrast  12/11/2014   CLINICAL DATA:  Severe left hip pain for 3 days. Rule  out septic arthritis. History of endocarditis.  EXAM: MRI OF THE LEFT HIP WITHOUT AND WITH CONTRAST  TECHNIQUE: Multiplanar, multisequence MR imaging was performed both before and after administration of intravenous contrast.  CONTRAST:  34m MULTIHANCE GADOBENATE DIMEGLUMINE 529 MG/ML IV SOLN   COMPARISON:  None.  FINDINGS: Bones: No acute fracture or dislocation. Severe marrow edema in the left femoral head with a subchondral cystic area.  No other marrow signal abnormality. Normal sacrum and sacroiliac joints.  Articular cartilage and labrum  Articular cartilage: Partial-thickness cartilage loss of bilateral hips, left greater than right.  Labrum:  No gross labral or paralabral abnormality.  Joint or bursal effusion  Joint effusion: Moderate left hip joint effusion without significant synovial enhancement. There is mild surrounding soft tissue enhancement.  Bursae:  No bursa formation.  Muscles and tendons  Muscles and tendons: There is edema within the obturator externus muscle bilaterally, left greater than right, which may reflect reactive edema versus muscle strain.  Other findings  Miscellaneous: Bladder wall thickening with a Foley catheter present within the bladder. There is severe surrounding edema around the bladder with mild enhancement. There is diffuse avid enhancement of the prostate gland with multiple nonenhancing areas within the prostate gland with the largest along the left mid gland measuring 2.2 x 1.4 cm which may reflect prostatitis with an abscess.  IMPRESSION: 1. Moderate left hip joint effusion with severe edema in the left femoral head and mild surrounding soft tissue edema with enhancement. The appearance is most concerning for septic arthritis until proven otherwise. Early AVN is also in the differential diagnosis, but currently considered less likely given the overall patient history. If there is further clinical concern, recommend arthrocentesis. 2. Severe bladder wall thickening with a Foley catheter present and surrounding edema with enhancement concerning for cystitis. 3. Diffuse avid enhancement of the prostate gland with multiple nonenhancing areas within the prostate gland with the largest along the left mid gland measuring 2.2 x 1.4 cm which may reflect prostatitis  with an abscess. 4. Edema within the obturator externus muscle bilaterally, left greater than right, likely reflecting muscle strain versus reactive edema given the lack of enhancement.   Electronically Signed   By: HKathreen Devoid  On: 12/11/2014 19:00   Dg Chest Port 1 View  11/21/2014   CLINICAL DATA:  Sepsis.  EXAM: PORTABLE CHEST - 1 VIEW  COMPARISON:  11/18/2014  FINDINGS: Heart size remains within normal limits. Prominent lung markings could represent edema. Again noted are patchy densities at the right lung base. Increased densities at the left lung base are concerning for pleural fluid and airspace disease. Concern for subtle airspace disease in left upper lung.  IMPRESSION: Increased densities at the left lung base likely represent pleural fluid and airspace disease.  Prominent lung markings with more confluent disease in the left upper lung and right lung base. Findings could represent asymmetric edema versus multi focal airspace disease.   Electronically Signed   By: AMarkus DaftM.D.   On: 11/21/2014 10:33   Dg Chest Port 1 View  11/18/2014   CLINICAL DATA:  Sepsis and weakness.  EXAM: PORTABLE CHEST - 1 VIEW  COMPARISON:  03/06/2014  FINDINGS: Subtle patchy densities at the right lung base. Heart size is normal. The trachea is midline. There is fullness in the mediastinum which could represent vascular congestion. Negative for a pneumothorax.  IMPRESSION: Patchy densities in the right lower lung with possible vascular congestion. Differential includes asymmetric pulmonary edema versus right  lower lobe pneumonia/airspace disease. Recommend follow-up evaluation.   Electronically Signed   By: Markus Daft M.D.   On: 11/18/2014 15:26   Dg Fluoro Guide Ndl Plcd/bx/inj/loc  12/12/2014   CLINICAL DATA:  Left hip pain and history of bacterial endocarditis. MRI has demonstrated a joint effusion and signal changes in the left femoral head.  EXAM: ARTHROCENTESIS OF THE LEFT HIP JOINT UNDER FLUOROSCOPY   FLUOROSCOPY TIME:  23 seconds.  PROCEDURE: Consent was obtained from the patient prior to the procedure. A time-out was performed. Overlying skin prepped with Betadine, draped in the usual sterile fashion, and infiltrated locally with 1% Lidocaine. A 22 gauge spinal needle was advanced to the superolateral margin of the left femoral head under fluoroscopy. 1 ml of Lidocaine injected easily. Aspiration was performed. Additional aspiration was also performed with a 20 gauge spinal needle advanced more superiorly in the joint.  Lavage was performed through the 20 gauge needle with 10 mL of sterile saline. Aspirated fluid was sent for requested laboratory studies.  FINDINGS: There was inability to aspirate free fluid from the joint with only a small amount of blood aspirated initially. After sterile saline lavage, approximately 4 mL of slightly blood-tinged fluid was recovered and sent for laboratory testing.  IMPRESSION: Aspiration of left hip joint under fluoroscopy.   Electronically Signed   By: Aletta Edouard M.D.   On: 12/12/2014 08:23   Dg Hip Unilat With Pelvis 2-3 Views Left  12/11/2014   CLINICAL DATA:  Lateral left hip pain for 2-3 days. No known injury. Initial encounter.  EXAM: DG HIP (WITH OR WITHOUT PELVIS) 2-3V LEFT  COMPARISON:  None.  FINDINGS: There is no acute bony or joint abnormality. No evidence of avascular necrosis of the femoral heads is identified. No notable degenerative change is seen about the hips, symphysis pubis or sacroiliac joints. Imaged soft tissue structures are unremarkable.  IMPRESSION: Negative exam.   Electronically Signed   By: Inge Rise M.D.   On: 12/11/2014 15:05    Microbiology: Recent Results (from the past 240 hour(s))  Blood culture (routine x 2)     Status: None   Collection Time: 12/08/14  6:09 PM  Result Value Ref Range Status   Specimen Description BLOOD LEFT HAND  Final   Special Requests BOTTLES DRAWN AEROBIC AND ANAEROBIC 5CC EACH  Final    Culture   Final    NO GROWTH 5 DAYS Performed at [facility]    Report Status 12/13/2014 FINAL  Final  Blood culture (routine x 2)     Status: None   Collection Time: 12/08/14  6:30 PM  Result Value Ref Range Status   Specimen Description BLOOD L HAND  Final   Special Requests BOTTLES DRAWN AEROBIC AND ANAEROBIC 5CC  Final   Culture   Final    NO GROWTH 5 DAYS Performed at [facility]    Report Status 12/13/2014 FINAL  Final  Blood culture (routine x 2)     Status: None   Collection Time: 12/11/14  3:15 PM  Result Value Ref Range Status   Specimen Description BLOOD RIGHT ANTECUBITAL  Final   Special Requests BOTTLES DRAWN AEROBIC AND ANAEROBIC 5CC  Final   Culture   Final    NO GROWTH 5 DAYS Performed at [facility]    Report Status 12/16/2014 FINAL  Final  Blood culture (routine x 2)     Status: None   Collection Time: 12/11/14  3:22 PM  Result Value Ref Range Status   Specimen Description BLOOD RIGHT HAND  Final   Special Requests BOTTLES DRAWN AEROBIC AND ANAEROBIC 4CC  Final   Culture   Final    NO GROWTH 5 DAYS Performed at [facility]    Report Status 12/16/2014 FINAL  Final  Body fluid culture     Status: None   Collection Time: 12/11/14 10:51 PM  Result Value Ref Range Status   Specimen Description SYNOVIAL  Final   Special Requests NONE  Final   Gram Stain   Final    WBC PRESENT, PREDOMINANTLY PMN NO ORGANISMS SEEN    Culture   Final    NO GROWTH 3 DAYS Performed at [facility]    Report Status 12/15/2014 FINAL  Final  Surgical pcr screen     Status: Abnormal   Collection Time: 12/14/14  6:40 AM  Result Value Ref Range Status   MRSA, PCR POSITIVE (A) NEGATIVE Final   Staphylococcus aureus POSITIVE (A) NEGATIVE Final    Comment:        The Xpert SA Assay (FDA approved for NASAL specimens in patients over 60 years of age), is one component of a comprehensive surveillance program.  Test performance has been  validated by [facility] for patients greater than or equal to 32 year old. It is not intended to diagnose infection nor to guide or monitor treatment. CRITICAL RESULT CALLED TO, READ BACK BY AND VERIFIED WITH: S.WHITTMORE AT 0820 ON 12/14/14 BY S.VANHOORNE   Anaerobic culture     Status: None (Preliminary result)   Collection Time: 12/14/14  4:31 PM  Result Value Ref Range Status   Specimen Description WOUND  Final   Special Requests NONE  Final   Gram Stain   Final    ABUNDANT WBC PRESENT,BOTH PMN AND MONONUCLEAR NO ORGANISMS SEEN    Culture   Final    NO ANAEROBES ISOLATED; CULTURE IN PROGRESS FOR 5 DAYS Performed at [facility]    Report Status PENDING  Incomplete  Body fluid culture     Status: None (Preliminary result)   Collection Time: 12/14/14  4:31 PM  Result Value Ref Range Status   Specimen Description   Final    HIP LEFT        Lymphocytosis. If a lymphoproliferative disorder is in the clinical differential diagnosis, fresh unrefrigerated    Special Requests PATIENT ON FOLLOWING VANCOMYCIN  Final   Gram Stain   Final    ABUNDANT WBC PRESENT,BOTH PMN AND MONONUCLEAR NO ORGANISMS SEEN    Culture   Final    NO GROWTH 3 DAYS Performed at [facility]    Report Status PENDING  Incomplete  Tissue culture     Status: None   Collection Time: 12/14/14  5:04 PM  Result Value Ref Range Status   Specimen Description HIP LEFT FEMORAL HEAD  Final   Special Requests PATIENT ON FOLLOWING VANCOMYCIN  Final   Gram Stain   Final    RARE WBC PRESENT,BOTH PMN AND MONONUCLEAR NO ORGANISMS SEEN Performed at [facility]    Culture   Final    NO GROWTH 3 DAYS Performed at [facility]    Report Status 12/18/2014 FINAL  Final  Anaerobic culture     Status: None (Preliminary result)   Collection Time: 12/14/14  5:17 PM  Result Value Ref Range Status   Specimen Description HIP LEFT FEMORAL HEAD  Final   Special Requests  NONE  Final   Gram  Stain   Final    FEW WBC PRESENT,BOTH PMN AND MONONUCLEAR NO ORGANISMS SEEN Performed at [facility]    Culture   Final    NO ANAEROBES ISOLATED; CULTURE IN PROGRESS FOR 5 DAYS Performed at [facility]    Report Status PENDING  Incomplete     Labs: Basic Metabolic Panel:  Recent Labs Lab 12/12/14 0525 12/14/14 0420 12/15/14 0250 12/16/14 0500 12/17/14 0535  NA 134* 140 137 139 139  K 4.1 3.6 4.3 3.6 3.8  CL 102 105 103 103 103  CO2 27 28 27 29 30   GLUCOSE 264* 81 288* 94 97  BUN 10 10 12 12 11   CREATININE 0.96 0.80 0.98 0.85 0.77  CALCIUM 8.4* 8.4* 8.2* 8.2* 8.4*   Liver Function Tests:  Recent Labs Lab 12/18/14 0900  AST 21  ALT 35  ALKPHOS 157*  BILITOT 0.4  PROT 7.0  ALBUMIN 2.3*   No results for input(s): LIPASE, AMYLASE in the last 168 hours. No results for input(s): AMMONIA in the last 168 hours. CBC:  Recent Labs Lab 12/11/14 1520 12/12/14 0525 12/14/14 0420 12/15/14 0250 12/16/14 0500 12/17/14 0535  WBC 13.6* 12.2* 14.8* 13.0* 15.4* 11.8*  NEUTROABS 11.0*  --  11.8*  --   --   --   HGB 8.6* 8.5* 8.0* 8.8* 8.0* 8.1*  HCT 28.0* 27.3* 25.6* 27.6* 25.3* 25.5*  MCV 80.7 81.0 80.0 81.7 80.6 81.2  PLT 480* 486* 472* 384 343 402*   Cardiac Enzymes: No results for input(s): CKTOTAL, CKMB, CKMBINDEX, TROPONINI in the last 168 hours. BNP: BNP (last 3 results) No results for input(s): BNP in the last 8760 hours.  ProBNP (last 3 results) No results for input(s): PROBNP in the last 8760 hours.  CBG:  Recent Labs Lab 12/17/14 0821 12/17/14 1132 12/17/14 1620 12/17/14 2155 12/18/14 0715  GLUCAP 113* 107* 127* 147* 83       Signed:  Vernell Leep, MD, FACP, FHM. [facility] Pager [phone]  If 7PM-7AM, please contact night-coverage [email or web address] Password [email or web address] 12/18/2014, 11:01 AM

## 2014-12-18 NOTE — Clinical Social Work Placement (Signed)
   CLINICAL SOCIAL WORK PLACEMENT  NOTE  Date:  12/18/2014  Patient Details  Name: Marc [name] MRN: [id number] Date of Birth: [birth year]  Clinical Social Work is seeking post-discharge placement for this patient at the Skilled  Nursing Facility level of care (*CSW will initial, date and re-position this form in  chart as items are completed):  Yes   Patient/family provided with [facility] Clinical Social Work Department's list of facilities offering this level of care within the geographic area requested by the patient (or if unable, by the patient's family).  Yes   Patient/family informed of their freedom to choose among providers that offer the needed level of care, that participate in Medicare, Medicaid or managed care program needed by the patient, have an available bed and are willing to accept the patient.  Yes   Patient/family informed of [facility]'s ownership interest in [facility] and [facility], as well as of the fact that they are under no obligation to receive care at these facilities.  PASRR submitted to EDS on 12/11/14     PASRR number received on 12/11/14     Existing PASRR number confirmed on       FL2 transmitted to all facilities in geographic area requested by pt/family on 12/11/14     FL2 transmitted to all facilities within larger geographic area on       Patient informed that his/her managed care company has contracts with or will negotiate with certain facilities, including the following:        Yes   Patient/family informed of bed offers received.  Patient chooses bed at [facility]     Physician recommends and patient chooses bed at      Patient to be transferred to [facility] on 12/18/14.  Patient to be transferred to facility by [facility]     Patient family notified on 12/18/14 of transfer.  Name of family member notified:  Pt notified family directly.     PHYSICIAN       Additional Comment:  Pt is in agreement with d/c to [facility] today. [facility] transport is required. LOG sent to SNF on 12/17/14 by Assistant DIR CSW. NSG reviewed d/c summary, scripts, avs. Scripts included in d/c packet. D/c summary sent to SNF for review prior to d/c.  _______________________________________________ Royetta Asal, LCSW 12/18/2014, 2:14 PM

## 2014-12-18 NOTE — Progress Notes (Signed)
Occupational Therapy Treatment Patient Details Name: Marc [name] MRN: [id number] DOB: 07/16/1960 Today's Date: 12/18/2014    History of present illness Marc [name] is a 54 y.o. 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 An I+D was performed in the ED, and he was placed on IV Vancomycin and Orhtopedics. S/P placement of antibiotic laden cemented THR   OT comments  Pt doing much better with sit to stand and SPT today.  Reinforced education and used AE this am  Follow Up Recommendations  SNF    Equipment Recommendations  3 in 1 bedside comode    Recommendations for Other Services      Precautions / Restrictions Precautions Precautions: Fall;Posterior Hip Precaution Comments: pt recalls 1/3 THPS, but doesn't cross legs nor internally rotate at rest (cues during turns) Restrictions LLE Weight Bearing: Partial weight bearing LLE Partial Weight Bearing Percentage or Pounds: 50       Mobility Bed Mobility               General bed mobility comments: oob  Transfers   Equipment used: Rolling walker (2 wheeled) Transfers: Sit to/from [employer] Sit to Stand: Min assist;+2 safety/equipment Stand pivot transfers: Min assist;+2 safety/equipment       General transfer comment: cues for sequence, UE/LE placement and assist to advance LLE with SPT.  Cues for not internally rotating during transfer    Balance                                   ADL Overall ADL's : Needs assistance/impaired                      Lower Body Dressing: Moderate assistance;Sit to/from stand;+2 for safety/equipment;With adaptive equipment   Toilet Transfer: Minimal assistance;+2 for safety/equipment;Stand-pivot;BSC;RW   Toileting- Clothing Manipulation and Hygiene: Minimal assistance;+2 for safety/equipment;Sit to/from stand         General ADL Comments: used 3;1 commode and practiced with sock aide and reacher for pants and socks.  Pt is unable to lift LLE.  Continues to need assist to advance this during SPTs and sit to stand      Vision                     Perception     Praxis      Cognition   Behavior During Therapy: [facility] for tasks assessed/performed;Anxious Overall Cognitive Status: No family/caregiver present to determine baseline cognitive functioning                       Extremity/Trunk Assessment               Exercises     Shoulder Instructions       General Comments      Pertinent Vitals/ Pain       Pain Score: 1  Pain Location: L hip Pain Descriptors / Indicators: Sore  Pain Intervention(s): Limited activity within patient's tolerance  Home Living                                          Prior Functioning/Environment              Frequency Min 2X/week     Progress Toward Goals  OT Goals(current goals can now be found in the care plan section)  Progress towards OT goals: Progressing toward goals     Plan Discharge plan remains appropriate    Co-evaluation                 End of Session     Activity Tolerance Patient tolerated treatment well   Patient Left in chair;with call bell/phone within reach   Nurse Communication          Time: 2130-8657 OT Time Calculation (min): 23 min  Charges: OT General Charges $OT Visit: 1 Procedure OT Treatments $Self Care/Home Management : 8-22 mins  Gianina Olinde 12/18/2014, 9:52 AM Marica Otter, OTR/L [phone] 12/18/2014

## 2014-12-18 NOTE — Progress Notes (Signed)
INFECTIOUS DISEASE PROGRESS NOTE  ID: Marc [name] is a 54 y.o. male with  Principal Problem:   Endocarditis Active Problems:   Essential hypertension   Non-compliance with treatment   Septic arthritis   Uncontrolled diabetes mellitus   Anemia   Hip pain   Hypoglycemia  Subjective: C/o stiffness in his hip.  Ambulating with assistance  Abtx:  Anti-infectives    Start     Dose/Rate Route Frequency Ordered Stop   12/16/14 1830  rifampin (RIFADIN) capsule 300 mg     300 mg Oral Daily 12/16/14 1821     12/14/14 2000  ceFAZolin (ANCEF) IVPB 2 g/50 mL premix     2 g 100 mL/hr over 30 Minutes Intravenous Every 6 hours 12/14/14 1930 12/15/14 0211   12/14/14 1657  tobramycin (NEBCIN) powder  Status:  Discontinued       As needed 12/14/14 1658 12/14/14 1742   12/14/14 1656  vancomycin (VANCOCIN) powder  Status:  Discontinued       As needed 12/14/14 1657 12/14/14 1742   12/12/14 1600  vancomycin (VANCOCIN) 1,250 mg in sodium chloride 0.9 % 250 mL IVPB     1,250 mg 166.7 mL/hr over 90 Minutes Intravenous Every 12 hours 12/12/14 1404     12/12/14 0600  vancomycin (VANCOCIN) IVPB 1000 mg/200 mL premix  Status:  Discontinued     1,000 mg 200 mL/hr over 60 Minutes Intravenous Every 12 hours 12/12/14 0315 12/12/14 1404   12/11/14 1530  vancomycin (VANCOCIN) 1,500 mg in sodium chloride 0.9 % 500 mL IVPB     1,500 mg 250 mL/hr over 120 Minutes Intravenous  Once 12/11/14 1500 12/11/14 1809      Medications:  Scheduled: . amLODipine  10 mg Oral Daily  . aspirin EC  325 mg Oral BID  . celecoxib  200 mg Oral Q12H  . Chlorhexidine Gluconate Cloth  6 each Topical Q0600  . docusate sodium  100 mg Oral BID  . ferrous sulfate  325 mg Oral TID PC  . folic acid  1 mg Oral Daily  . insulin aspart  0-9 Units Subcutaneous TID WC  . insulin glargine  20 Units Subcutaneous QHS  . lisinopril  5 mg Oral Daily  . multivitamin with minerals  1 tablet Oral Daily  . mupirocin ointment  1  application Nasal BID  . pantoprazole  40 mg Oral Daily  . polyethylene glycol  17 g Oral BID  . rifampin  300 mg Oral Daily  . sodium chloride  10-40 mL Intracatheter Q12H  . vancomycin  1,250 mg Intravenous Q12H    Objective: Vital signs in last 24 hours: Temp:  [98.3 F (36.8 C)-99.8 F (37.7 C)] 98.3 F (36.8 C) (07/29 0440) Pulse Rate:  [92-96] 95 (07/29 0440) Resp:  [15-16] 16 (07/29 0440) BP: (132-144)/(71-81) 143/81 mmHg (07/29 0440) SpO2:  [97 %-100 %] 100 % (07/29 0440)   General appearance: alert, cooperative and no distress Resp: clear to auscultation bilaterally Cardio: regular rate and rhythm GI: normal findings: bowel sounds normal and soft, non-tender  Lab Results  Recent Labs  12/16/14 0500 12/17/14 0535  WBC 15.4* 11.8*  HGB 8.0* 8.1*  HCT 25.3* 25.5*  NA 139 139  K 3.6 3.8  CL 103 103  CO2 29 30  BUN 12 11  CREATININE 0.85 0.77   Liver Panel No results for input(s): PROT, ALBUMIN, AST, ALT, ALKPHOS, BILITOT, BILIDIR, IBILI in the last 72 hours. Sedimentation Rate No results for  input(s): ESRSEDRATE in the last 72 hours. C-Reactive Protein No results for input(s): CRP in the last 72 hours.  Microbiology: Recent Results (from the past 240 hour(s))  Blood culture (routine x 2)     Status: None   Collection Time: 12/08/14  6:09 PM  Result Value Ref Range Status   Specimen Description BLOOD LEFT HAND  Final   Special Requests BOTTLES DRAWN AEROBIC AND ANAEROBIC 5CC EACH  Final   Culture   Final    NO GROWTH 5 DAYS Performed at [facility]    Report Status 12/13/2014 FINAL  Final  Blood culture (routine x 2)     Status: None   Collection Time: 12/08/14  6:30 PM  Result Value Ref Range Status   Specimen Description BLOOD L HAND  Final   Special Requests BOTTLES DRAWN AEROBIC AND ANAEROBIC 5CC  Final   Culture   Final    NO GROWTH 5 DAYS Performed at [facility]    Report Status 12/13/2014 FINAL  Final  Blood culture  (routine x 2)     Status: None   Collection Time: 12/11/14  3:15 PM  Result Value Ref Range Status   Specimen Description BLOOD RIGHT ANTECUBITAL  Final   Special Requests BOTTLES DRAWN AEROBIC AND ANAEROBIC 5CC  Final   Culture   Final    NO GROWTH 5 DAYS Performed at [facility]    Report Status 12/16/2014 FINAL  Final  Blood culture (routine x 2)     Status: None   Collection Time: 12/11/14  3:22 PM  Result Value Ref Range Status   Specimen Description BLOOD RIGHT HAND  Final   Special Requests BOTTLES DRAWN AEROBIC AND ANAEROBIC 4CC  Final   Culture   Final    NO GROWTH 5 DAYS Performed at [facility]    Report Status 12/16/2014 FINAL  Final  Body fluid culture     Status: None   Collection Time: 12/11/14 10:51 PM  Result Value Ref Range Status   Specimen Description SYNOVIAL  Final   Special Requests NONE  Final   Gram Stain   Final    WBC PRESENT, PREDOMINANTLY PMN NO ORGANISMS SEEN    Culture   Final    NO GROWTH 3 DAYS Performed at [facility]    Report Status 12/15/2014 FINAL  Final  Surgical pcr screen     Status: Abnormal   Collection Time: 12/14/14  6:40 AM  Result Value Ref Range Status   MRSA, PCR POSITIVE (A) NEGATIVE Final   Staphylococcus aureus POSITIVE (A) NEGATIVE Final    Comment:        The Xpert SA Assay (FDA approved for NASAL specimens in patients over 27 years of age), is one component of a comprehensive surveillance program.  Test performance has been validated by [facility] for patients greater than or equal to 68 year old. It is not intended to diagnose infection nor to guide or monitor treatment. CRITICAL RESULT CALLED TO, READ BACK BY AND VERIFIED WITH: S.WHITTMORE AT 0820 ON 12/14/14 BY S.VANHOORNE   Anaerobic culture     Status: None (Preliminary result)   Collection Time: 12/14/14  4:31 PM  Result Value Ref Range Status   Specimen Description WOUND  Final   Special Requests NONE  Final   Gram Stain    Final    ABUNDANT WBC PRESENT,BOTH PMN AND MONONUCLEAR NO ORGANISMS SEEN    Culture   Final  NO ANAEROBES ISOLATED; CULTURE IN PROGRESS FOR 5 DAYS Performed at [facility]    Report Status PENDING  Incomplete  Body fluid culture     Status: None (Preliminary result)   Collection Time: 12/14/14  4:31 PM  Result Value Ref Range Status   Specimen Description   Final    HIP LEFT        Lymphocytosis. If a lymphoproliferative disorder is in the clinical differential diagnosis, fresh unrefrigerated    Special Requests PATIENT ON FOLLOWING VANCOMYCIN  Final   Gram Stain   Final    ABUNDANT WBC PRESENT,BOTH PMN AND MONONUCLEAR NO ORGANISMS SEEN    Culture   Final    NO GROWTH 3 DAYS Performed at [facility]    Report Status PENDING  Incomplete  Tissue culture     Status: None (Preliminary result)   Collection Time: 12/14/14  5:04 PM  Result Value Ref Range Status   Specimen Description HIP LEFT FEMORAL HEAD  Final   Special Requests PATIENT ON FOLLOWING VANCOMYCIN  Final   Gram Stain   Final    RARE WBC PRESENT,BOTH PMN AND MONONUCLEAR NO ORGANISMS SEEN Performed at [facility]    Culture   Final    NO GROWTH 2 DAYS Performed at [facility]    Report Status PENDING  Incomplete  Anaerobic culture     Status: None (Preliminary result)   Collection Time: 12/14/14  5:17 PM  Result Value Ref Range Status   Specimen Description HIP LEFT FEMORAL HEAD  Final   Special Requests NONE  Final   Gram Stain   Final    FEW WBC PRESENT,BOTH PMN AND MONONUCLEAR NO ORGANISMS SEEN Performed at [facility]    Culture   Final    NO ANAEROBES ISOLATED; CULTURE IN PROGRESS FOR 5 DAYS Performed at [facility]    Report Status PENDING  Incomplete    Studies/Results: No results found.   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  Watch LFTS on  rifampin Cx pending from OR, his aspirate Cx are (-).  D/c to SNF today  Resection L hip Continue vanco/rifampin Continue PT for his stiffness.   Endocarditis, presumed Continue vanco/rifampin  Septic pulmonary emboli Continue vanco/rifampin He is asx  DM2 uncontrolled Needs better control Needs close f/u at d/c On ACE-I  Drug Monitoring- vanco His Cr is stable Vanco level is therapeutic.   Total days of antibiotics: day 3 vanco/rifampin Complete anbx on September 6 Case discussed with Dr Waymon Amato Please have him f/u in ID clinic in 4 weeks         Johny Sax Infectious Diseases (pager) [phone] [email or web address] 12/18/2014, 7:49 AM  LOS: 6 days

## 2014-12-18 NOTE — Progress Notes (Signed)
     Subjective: 4 Days Post-Op Procedure(s) (LRB): Left hip resection with prostalac  (Left)   Patient reports pain as mild, pain controlled. No events throughout the night. Objective:   VITALS:   Filed Vitals:   12/18/14 0440  BP: 143/81  Pulse: 95  Temp: 98.3 F (36.8 C)  Resp: 16    Dorsiflexion/Plantar flexion intact Incision: dressing C/D/I No cellulitis present Compartment soft  LABS  Recent Labs  12/16/14 0500 12/17/14 0535  HGB 8.0* 8.1*  HCT 25.3* 25.5*  WBC 15.4* 11.8*  PLT 343 402*     Recent Labs  12/16/14 0500 12/17/14 0535  NA 139 139  K 3.6 3.8  BUN 12 11  CREATININE 0.85 0.77  GLUCOSE 94 97     Assessment/Plan: 4 Days Post-Op Procedure(s) (LRB): Left hip resection with prostalac  (Left) Dressing changed yesterday to Aquacel dressing, may remain in place for 2 weeks. Up with therapy Discharge to SNF eventually when ready ID recommendation placed      Anastasio Auerbach. Rosey Eide   PAC  12/18/2014, 8:39 AM

## 2014-12-18 NOTE — Progress Notes (Signed)
Attempted to call report to [facility] left on hold several minutes.  Sharrell Ku RN

## 2014-12-19 LAB — ANAEROBIC CULTURE

## 2014-12-20 LAB — ANAEROBIC CULTURE

## 2014-12-21 ENCOUNTER — Non-Acute Institutional Stay (SKILLED_NURSING_FACILITY): Payer: Medicaid Other | Admitting: Internal Medicine

## 2014-12-21 ENCOUNTER — Encounter: Payer: Self-pay | Admitting: Internal Medicine

## 2014-12-21 DIAGNOSIS — IMO0002 Reserved for concepts with insufficient information to code with codable children: Secondary | ICD-10-CM

## 2014-12-21 DIAGNOSIS — B9562 Methicillin resistant Staphylococcus aureus infection as the cause of diseases classified elsewhere: Secondary | ICD-10-CM | POA: Diagnosis not present

## 2014-12-21 DIAGNOSIS — Z96642 Presence of left artificial hip joint: Secondary | ICD-10-CM

## 2014-12-21 DIAGNOSIS — Z9660 Presence of unspecified orthopedic joint implant: Secondary | ICD-10-CM | POA: Diagnosis not present

## 2014-12-21 DIAGNOSIS — R7881 Bacteremia: Secondary | ICD-10-CM | POA: Diagnosis not present

## 2014-12-21 DIAGNOSIS — I10 Essential (primary) hypertension: Secondary | ICD-10-CM

## 2014-12-21 DIAGNOSIS — Z9889 Other specified postprocedural states: Secondary | ICD-10-CM | POA: Insufficient documentation

## 2014-12-21 DIAGNOSIS — E1165 Type 2 diabetes mellitus with hyperglycemia: Secondary | ICD-10-CM | POA: Diagnosis not present

## 2014-12-21 DIAGNOSIS — M009 Pyogenic arthritis, unspecified: Secondary | ICD-10-CM

## 2014-12-21 DIAGNOSIS — I38 Endocarditis, valve unspecified: Secondary | ICD-10-CM

## 2014-12-21 NOTE — Progress Notes (Addendum)
Patient ID: Marc [name], male   DOB: 06/01/1960, 54 y.o.   MRN: [id number]    HISTORY AND PHYSICAL   DATE: 12/21/14  Location:  [facility] [place]    Place of Service: SNF [phone])   Extended Emergency Contact Information Primary Emergency Contact: [place] Mobile Phone: [phone] Relation: Mother Secondary Emergency Contact: Susie [place] Mobile Phone: [phone] Relation: Friend  Advanced Directive information   FULL CODE; MOST FORM ON CHART   Chief Complaint  Patient presents with  . 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 Over the last 1-2 mos he has been in and out of the hospital, even signed out AMA once. He was 1st treated with IV vanco/zosyn then transitioned to po levaquin. On the most recent admission, CT chest revealed b/l multiple pulmonary nodules, some cavitating. He c/o left hip pain and MRI hip revealed septic arthritis. He was taken to OR and is s/p resection of left hip with placement of abx laden temporary cemented THR 7/25th. ID consulted and pt started on IV vanco via PICC and po rifampin. He will complete 6 weeks of tx (9/6th). TEE not performed. 2D echo on 7/12 revealed no vegetation and nml EF (55-60%).  Today he reports c/a filing for disability as he will require another hip sx once abx completed. He is interested in diabetic education as his A1c was 12.1% last month. His pain is well controlled on roxicodone. No low BS reactions. CBG 112 today and have been fluctuating 140-270s (one >300). He is taking SSI and lantus. He is on contact isolation  BP stable on lisinopril and amlodipine  Acid reflux sx's stable on protonix.  He is taking several vitamin/mineral  supplements   Constipation stable on miralax and dulcolax  He takes ASA for DVT prophylaxis  Past Medical History  Diagnosis Date  . Diabetes mellitus without complication   . Hypertension   . Endocarditis     Past Surgical History  Procedure Laterality Date  . No past surgeries    . Video bronchoscopy Bilateral 12/03/2014    Procedure: VIDEO BRONCHOSCOPY WITHOUT FLUORO;  Surgeon: Chesley Mires, MD;  Location: [facility];  Service: Cardiopulmonary;  Laterality: Bilateral;  . Total hip revision Left 12/14/2014    Procedure: Left hip resection with prostalac ;  Surgeon: Paralee Cancel, MD;  Location: [facility];  Service: Orthopedics;  Laterality: Left;    Patient Care Team: No Pcp Per Patient as PCP - General (General Practice)  History   Social History  . Marital Status: Single    Spouse Name: N/A  . Number of Children: N/A  . Years of Education: N/A   Occupational History  . Not on file.   Social History Main Topics  . Smoking status: Never Smoker   . Smokeless tobacco: Never Used  . Alcohol Use: No     Comment: patient states he does not drink on 12/08/14  . Drug Use: No  . Sexual Activity: Not on file   Other Topics Concern  . Not on file   Social History Narrative     reports that he has never smoked. He has never used smokeless tobacco. He reports that he does not drink alcohol or use illicit drugs.  Family History  Problem Relation Age of Onset  . Hypertension Mother   . Diabetes Sister   . Diabetes Brother    No family status information on file.     There is no immunization history on file for this patient.  No Known Allergies  Medications: Patient's Medications  New Prescriptions   No medications on file  Previous Medications   ACETAMINOPHEN (TYLENOL) 325 MG TABLET    Take 2 tablets (650 mg total) by mouth every 6 (six) hours as needed for mild pain (or Fever >/= 101).   ALUM & MAG HYDROXIDE-SIMETH (MAALOX/MYLANTA) 200-200-20 MG/5ML SUSPENSION     Take 30 mLs by mouth every 6 (six) hours as needed for indigestion or heartburn.   AMLODIPINE (NORVASC) 10 MG TABLET    Take 1 tablet (10 mg total) by mouth daily.   ASPIRIN EC 325 MG EC TABLET    Take 1 tablet (325 mg total) by mouth 2 (two) times daily.   BISACODYL (DULCOLAX) 10 MG SUPPOSITORY    Place 1 suppository (10 mg total) rectally daily as needed for moderate constipation.   DOCUSATE SODIUM (COLACE) 100 MG CAPSULE    Take 1 capsule (100 mg total) by mouth 2 (two) times daily.   FERROUS SULFATE 325 (65 FE) MG TABLET    Take 1 tablet (325 mg total) by mouth 3 (three) times daily after meals.   FOLIC ACID (FOLVITE) 1 MG TABLET    Take 1 tablet (1 mg total) by mouth daily.   INSULIN ASPART (NOVOLOG) 100 UNIT/ML INJECTION    Inject 0-9 Units into the skin 3 (three) times daily with meals. CBG < 70: implement hypoglycemia protocol CBG 70 - 120: 0 units CBG 121 - 150: 1 unit CBG 151 - 200: 2 units CBG 201 - 250: 3 units CBG 251 - 300: 5 units CBG 301 - 350: 7 units CBG 351 - 400: 9 units CBG > 400: call MD.   INSULIN GLARGINE (LANTUS) 100 UNIT/ML INJECTION    Inject 0.15 mLs (15 Units total) into the skin at bedtime.   LISINOPRIL (PRINIVIL,ZESTRIL) 5 MG TABLET    Take 1 tablet (5 mg total) by mouth daily.   MULTIPLE VITAMIN (MULTIVITAMIN WITH MINERALS) TABS TABLET    Take 1 tablet by mouth daily.   OXYCODONE (OXY IR/ROXICODONE) 5 MG IMMEDIATE RELEASE TABLET    Take 1-2 tablets (5-10 mg total) by mouth every 4 (four) hours as needed for moderate pain.   PANTOPRAZOLE (PROTONIX) 40 MG TABLET    Take 1 tablet (40 mg total) by mouth daily.   POLYETHYLENE GLYCOL (MIRALAX / GLYCOLAX) PACKET    Take 17 g by mouth 2 (two) times daily.   RIFAMPIN (RIFADIN) 300 MG CAPSULE    Take 1 capsule (300 mg total) by mouth daily. Complete on January 26, 2015.   THIAMINE 100 MG TABLET    Take 1 tablet (100 mg total) by mouth daily.   VANCOMYCIN 1,250 MG IN SODIUM CHLORIDE 0.9 % 250 ML    Inject 1,250 mg into  the vein every 12 (twelve) hours. Complete on January 26, 2015.  Modified Medications   No medications on file  Discontinued Medications   No medications on file    Review of Systems  Constitutional: Negative for fever, chills, activity change and fatigue.  HENT: Negative for sore throat and trouble swallowing.   Eyes: Negative for visual disturbance.  Respiratory: Negative for cough, chest tightness and shortness of breath.   Cardiovascular:  Negative for chest pain, palpitations and leg swelling.  Gastrointestinal: Negative for nausea, vomiting, abdominal pain and blood in stool.  Genitourinary: Negative for urgency, frequency and difficulty urinating.  Musculoskeletal: Positive for arthralgias and gait problem.  Skin: Negative for rash.  Neurological: Negative for weakness, numbness and headaches.  Psychiatric/Behavioral: Negative for confusion and sleep disturbance. The patient is not nervous/anxious.     Filed Vitals:   12/21/14 1459  BP: 134/87  Pulse: 104   There is no weight on file to calculate BMI.  Physical Exam  Constitutional: He is oriented to person, place, and time. He appears well-developed and well-nourished. No distress.  Sitting in w/c in NAD  HENT:  Mouth/Throat: Oropharynx is clear and moist.  Eyes: Pupils are equal, round, and reactive to light. No scleral icterus.  Neck: Neck supple. Carotid bruit is not present. No thyromegaly present.  Cardiovascular: Normal rate, regular rhythm and intact distal pulses.  Exam reveals no gallop and no friction rub.   Murmur (1/6 SEM) heard. Trace distal LLE swelling. No RLE edema. No calf TTP. Right arm PICC intact and signs of secondary infection at insertion site  Pulmonary/Chest: Effort normal and breath sounds normal. He has no wheezes. He has no rales. He exhibits no tenderness.  Abdominal: Soft. Bowel sounds are normal. He exhibits no distension, no abdominal bruit, no pulsatile midline mass and no mass. There is  no tenderness. There is no rebound and no guarding.  Musculoskeletal: He exhibits edema and tenderness.  Lymphadenopathy:    He has no cervical adenopathy.  Neurological: He is alert and oriented to person, place, and time. He has normal reflexes.  Skin: Skin is warm and dry. No rash noted.  Psychiatric: He has a normal mood and affect. His behavior is normal. Thought content normal.     Labs reviewed: Admission on 12/11/2014, Discharged on 12/18/2014  No results displayed because visit has over 200 results.    Hospital Outpatient Visit on 12/11/2014  Component Date Value Ref Range Status  . Specimen Description 12/11/2014 SYNOVIAL   Final  . Special Requests 12/11/2014 NONE   Final  . Gram Stain 12/11/2014    Final                   Value:WBC PRESENT, PREDOMINANTLY PMN NO ORGANISMS SEEN   . Culture 12/11/2014    Final                   Value:NO GROWTH 3 DAYS Performed at [facility]   . Report Status 12/11/2014 12/15/2014 FINAL   Final  . Color, Synovial 12/11/2014 RED* YELLOW Final  . Appearance-Synovial 12/11/2014 TURBID* CLEAR Final  . Crystals, Fluid 12/11/2014 NO CRYSTALS SEEN   Final  . WBC, Synovial 12/11/2014 UNABLE TO PERFORM COUNT DUE TO CLOT IN SPECIMEN  0 - 200 /cu mm Final  . Neutrophil, Synovial 12/11/2014 88* 0 - 25 % Final  . Lymphocytes-Synovial Fld 12/11/2014 10  0 - 20 % Final  . Monocyte-Macrophage-Synovial Fluid 12/11/2014 2* 50 - 90 % Final  . Glucose, Synovial Fluid 12/11/2014 <20   Final   Comment: (NOTE) Result repeated and verified. Reference range: Synovial fluid glucose values are equivalent to plasma values if obtained from a fasting patient. The difference between the plasma glucose and synovial fluid glucose value should be < 10 mg/dL. Performed at [facility]   . Protein, Synovial Fluid 12/11/2014 <1.0* 1.0 - 3.0 g/dL Final   Comment: Result repeated  and verified. Performed at [facility] Visit on  12/11/2014  Component Date Value Ref Range Status  . Color, UA 12/11/2014 yellow   Final  . Clarity, UA 12/11/2014 clear   Final  . Glucose, UA 12/11/2014 neg   Final  . Bilirubin, UA 12/11/2014 neg   Final  . Ketones, UA 12/11/2014 neg   Final  . Spec Grav, UA 12/11/2014 1.025   Final  . Blood, UA 12/11/2014 trace   Final  . pH, UA 12/11/2014 5.0   Final  . Protein, UA 12/11/2014 >=300   Final  . Urobilinogen, UA 12/11/2014 0.2   Final  . Nitrite, UA 12/11/2014 neg   Final  . Leukocytes, UA 12/11/2014 Negative  Negative Final  Admission on 12/08/2014, Discharged on 12/10/2014  Component Date Value Ref Range Status  . WBC 12/08/2014 10.9* 4.0 - 10.5 K/uL Final  . RBC 12/08/2014 3.10* 4.22 - 5.81 MIL/uL Final  . Hemoglobin 12/08/2014 8.0* 13.0 - 17.0 g/dL Final  . HCT 12/08/2014 25.5* 39.0 - 52.0 % Final  . MCV 12/08/2014 82.3  78.0 - 100.0 fL Final  . MCH 12/08/2014 25.8* 26.0 - 34.0 pg Final  . MCHC 12/08/2014 31.4  30.0 - 36.0 g/dL Final  . RDW 12/08/2014 15.1  11.5 - 15.5 % Final  . Platelets 12/08/2014 415* 150 - 400 K/uL Final  . Neutrophils Relative % 12/08/2014 69  43 - 77 % Final  . Neutro Abs 12/08/2014 7.6  1.7 - 7.7 K/uL Final  . Lymphocytes Relative 12/08/2014 18  12 - 46 % Final  . Lymphs Abs 12/08/2014 2.0  0.7 - 4.0 K/uL Final  . Monocytes Relative 12/08/2014 9  3 - 12 % Final  . Monocytes Absolute 12/08/2014 1.0  0.1 - 1.0 K/uL Final  . Eosinophils Relative 12/08/2014 3  0 - 5 % Final  . Eosinophils Absolute 12/08/2014 0.3  0.0 - 0.7 K/uL Final  . Basophils Relative 12/08/2014 1  0 - 1 % Final  . Basophils Absolute 12/08/2014 0.1  0.0 - 0.1 K/uL Final  . Sodium 12/08/2014 137  135 - 145 mmol/L Final  . Potassium 12/08/2014 3.8  3.5 - 5.1 mmol/L Final  . Chloride 12/08/2014 101  101 - 111 mmol/L Final  . CO2 12/08/2014 27  22 - 32 mmol/L Final  . Glucose, Bld 12/08/2014 235* 65 - 99 mg/dL Final  . BUN 12/08/2014 12  6 - 20 mg/dL Final  . Creatinine, Ser  12/08/2014 0.91  0.61 - 1.24 mg/dL Final  . Calcium 12/08/2014 8.5* 8.9 - 10.3 mg/dL Final  . Total Protein 12/08/2014 7.2  6.5 - 8.1 g/dL Final  . Albumin 12/08/2014 2.5* 3.5 - 5.0 g/dL Final  . AST 12/08/2014 16  15 - 41 U/L Final  . ALT 12/08/2014 37  17 - 63 U/L Final  . Alkaline Phosphatase 12/08/2014 161* 38 - 126 U/L Final  . Total Bilirubin 12/08/2014 0.1* 0.3 - 1.2 mg/dL Final  . GFR calc non Af Amer 12/08/2014 >60  >60 mL/min Final  . GFR calc Af Amer 12/08/2014 >60  >60 mL/min Final   Comment: (NOTE) The eGFR has been calculated using the CKD EPI equation. This calculation has not been validated in all clinical situations. eGFR's persistently <60 mL/min signify possible Chronic Kidney Disease.   . Anion gap 12/08/2014 9  5 - 15 Final  . Specimen Description 12/08/2014 BLOOD L HAND   Final  . Special Requests 12/08/2014 BOTTLES  DRAWN AEROBIC AND ANAEROBIC 5CC   Final  . Culture 12/08/2014    Final                   Value:NO GROWTH 5 DAYS Performed at [facility]   . Report Status 12/08/2014 12/13/2014 FINAL   Final  . Specimen Description 12/08/2014 BLOOD LEFT HAND   Final  . Special Requests 12/08/2014 BOTTLES DRAWN AEROBIC AND ANAEROBIC 5CC EACH   Final  . Culture 12/08/2014    Final                   Value:NO GROWTH 5 DAYS Performed at [facility]   . Report Status 12/08/2014 12/13/2014 FINAL   Final  . Lactic Acid, Venous 12/08/2014 0.5  0.5 - 2.0 mmol/L Final  . Sodium 12/09/2014 138  135 - 145 mmol/L Final  . Potassium 12/09/2014 3.4* 3.5 - 5.1 mmol/L Final  . Chloride 12/09/2014 100* 101 - 111 mmol/L Final  . CO2 12/09/2014 30  22 - 32 mmol/L Final  . Glucose, Bld 12/09/2014 125* 65 - 99 mg/dL Final  . BUN 12/09/2014 8  6 - 20 mg/dL Final  . Creatinine, Ser 12/09/2014 0.86  0.61 - 1.24 mg/dL Final  . Calcium 12/09/2014 8.5* 8.9 - 10.3 mg/dL Final  . GFR calc non Af Amer 12/09/2014 >60  >60 mL/min Final  . GFR calc Af Amer 12/09/2014 >60  >60  mL/min Final   Comment: (NOTE) The eGFR has been calculated using the CKD EPI equation. This calculation has not been validated in all clinical situations. eGFR's persistently <60 mL/min signify possible Chronic Kidney Disease.   . Anion gap 12/09/2014 8  5 - 15 Final  . WBC 12/09/2014 9.9  4.0 - 10.5 K/uL Final  . RBC 12/09/2014 3.19* 4.22 - 5.81 MIL/uL Final  . Hemoglobin 12/09/2014 8.3* 13.0 - 17.0 g/dL Final  . HCT 12/09/2014 26.2* 39.0 - 52.0 % Final  . MCV 12/09/2014 82.1  78.0 - 100.0 fL Final  . MCH 12/09/2014 26.0  26.0 - 34.0 pg Final  . MCHC 12/09/2014 31.7  30.0 - 36.0 g/dL Final  . RDW 12/09/2014 14.8  11.5 - 15.5 % Final  . Platelets 12/09/2014 459* 150 - 400 K/uL Final  . Glucose-Capillary 12/08/2014 249* 65 - 99 mg/dL Final  . Comment 1 12/08/2014 Notify RN   Final  . Glucose-Capillary 12/09/2014 169* 65 - 99 mg/dL Final  . Comment 1 12/09/2014 Notify RN   Final  . Comment 2 12/09/2014 Document in Chart   Final  . Glucose-Capillary 12/09/2014 166* 65 - 99 mg/dL Final  . Comment 1 12/09/2014 Notify RN   Final  . Comment 2 12/09/2014 Document in Chart   Final  . Glucose-Capillary 12/09/2014 119* 65 - 99 mg/dL Final  . Glucose-Capillary 12/09/2014 95  65 - 99 mg/dL Final  . Glucose-Capillary 12/10/2014 64* 65 - 99 mg/dL Final  . Comment 1 12/10/2014 Notify RN   Final  . Comment 2 12/10/2014 Document in Chart   Final  . Glucose-Capillary 12/10/2014 93  65 - 99 mg/dL Final  . Comment 1 12/10/2014 Notify RN   Final  . Comment 2 12/10/2014 Document in Chart   Final  . Vancomycin Tr 12/10/2014 20  10.0 - 20.0 ug/mL Final  . Glucose-Capillary 12/10/2014 118* 65 - 99 mg/dL Final  . Comment 1 12/10/2014 Notify RN   Final  . Comment 2 12/10/2014 Document in Chart   Final  Office Visit on 12/08/2014  Component Date Value Ref Range Status  . POC Glucose 12/08/2014 284.0* 70 - 99 mg/dl Final  Admission on 11/30/2014, Discharged on 12/04/2014  No results displayed because  visit has over 200 results.    Admission on 11/16/2014, Discharged on 11/22/2014  No results displayed because visit has over 200 results.     Lab Results  Component Value Date   HGBA1C 12.1* 11/16/2014     Dg Chest 2 View  11/30/2014   CLINICAL DATA:  Recently hospitalized with urinary tract infection. Fever. History of diabetes. Initial encounter.  EXAM: CHEST  2 VIEW  COMPARISON:  11/21/2014 and 11/18/2014 radiographs.  FINDINGS: The heart size and mediastinal contours are stable without definite adenopathy. There is overall improved aeration of the left lung base. However, the patient has developed ill-defined nodular densities in both lungs worrisome for possible septic emboli. No significant pleural effusion. The bones appear unchanged.  IMPRESSION: Interval increased nodularity to the previously demonstrated bilateral airspace opacities worrisome for developing septic emboli. No significant pleural effusion.  Further evaluation with chest CT recommended (preferably with contrast if possible).   Electronically Signed   By: Richardean Sale M.D.   On: 11/30/2014 21:16   Ct Chest W Contrast  11/30/2014   CLINICAL DATA:  Recent diagnosis of urinary tract infection 2 weeks ago. Now developing right lateral chest pain since this morning. Plain radiographs demonstrated increase nodular infiltrates.  EXAM: CT CHEST WITH CONTRAST  TECHNIQUE: Multidetector CT imaging of the chest was performed during intravenous contrast administration.  CONTRAST:  18m OMNIPAQUE IOHEXOL 300 MG/ML  SOLN  COMPARISON:  Chest 11/30/2014  FINDINGS: Multiple diffuse nodular areas of consolidation throughout both lungs, many with cavitation. Largest lesion is present in the superior segment left lower lobe and measures about 1.8 x 2.3 cm. Appearance is compatible with septic emboli although nonspecific. Short-term follow-up after resolution of acute process is recommended to exclude metastasis. Differential diagnosis would  also include Wegner's granulomatosis or atypical infection such as TB or fungal infection. Small right pleural effusion. No pneumothorax.  Normal heart size. Normal caliber thoracic aorta. Scattered mediastinal and hilar lymph nodes as well as axillary lymph nodes are present without pathologic enlargement. These are likely reactive. Small esophageal hiatal hernia. Esophagus is mostly decompressed.  Included portions of the upper abdominal organs are grossly unremarkable. No destructive bone lesions.  IMPRESSION: Multiple pulmonary nodules bilaterally, many with cavitation. Appearance is compatible with septic emboli although differential diagnosis would include metastasis, TB or fungal infection. Small right pleural effusion.   Electronically Signed   By: WLucienne CapersM.D.   On: 11/30/2014 22:45   Mr Hip Left W Wo Contrast  12/11/2014   CLINICAL DATA:  Severe left hip pain for 3 days. Rule out septic arthritis. History of endocarditis.  EXAM: MRI OF THE LEFT HIP WITHOUT AND WITH CONTRAST  TECHNIQUE: Multiplanar, multisequence MR imaging was performed both before and after administration of intravenous contrast.  CONTRAST:  180mMULTIHANCE GADOBENATE DIMEGLUMINE 529 MG/ML IV SOLN  COMPARISON:  None.  FINDINGS: Bones: No acute fracture or dislocation. Severe marrow edema in the left femoral head with a subchondral cystic area.  No other marrow signal abnormality. Normal sacrum and sacroiliac joints.  Articular cartilage and labrum  Articular cartilage: Partial-thickness cartilage loss of bilateral hips, left greater than right.  Labrum:  No gross labral or paralabral abnormality.  Joint or bursal effusion  Joint effusion: Moderate left hip joint effusion without significant synovial  enhancement. There is mild surrounding soft tissue enhancement.  Bursae:  No bursa formation.  Muscles and tendons  Muscles and tendons: There is edema within the obturator externus muscle bilaterally, left greater than right,  which may reflect reactive edema versus muscle strain.  Other findings  Miscellaneous: Bladder wall thickening with a Foley catheter present within the bladder. There is severe surrounding edema around the bladder with mild enhancement. There is diffuse avid enhancement of the prostate gland with multiple nonenhancing areas within the prostate gland with the largest along the left mid gland measuring 2.2 x 1.4 cm which may reflect prostatitis with an abscess.  IMPRESSION: 1. Moderate left hip joint effusion with severe edema in the left femoral head and mild surrounding soft tissue edema with enhancement. The appearance is most concerning for septic arthritis until proven otherwise. Early AVN is also in the differential diagnosis, but currently considered less likely given the overall patient history. If there is further clinical concern, recommend arthrocentesis. 2. Severe bladder wall thickening with a Foley catheter present and surrounding edema with enhancement concerning for cystitis. 3. Diffuse avid enhancement of the prostate gland with multiple nonenhancing areas within the prostate gland with the largest along the left mid gland measuring 2.2 x 1.4 cm which may reflect prostatitis with an abscess. 4. Edema within the obturator externus muscle bilaterally, left greater than right, likely reflecting muscle strain versus reactive edema given the lack of enhancement.   Electronically Signed   By: Kathreen Devoid   On: 12/11/2014 19:00   Dg Fluoro Guide Ndl Plcd/bx/inj/loc  12/12/2014   CLINICAL DATA:  Left hip pain and history of bacterial endocarditis. MRI has demonstrated a joint effusion and signal changes in the left femoral head.  EXAM: ARTHROCENTESIS OF THE LEFT HIP JOINT UNDER FLUOROSCOPY  FLUOROSCOPY TIME:  23 seconds.  PROCEDURE: Consent was obtained from the patient prior to the procedure. A time-out was performed. Overlying skin prepped with Betadine, draped in the usual sterile fashion, and  infiltrated locally with 1% Lidocaine. A 22 gauge spinal needle was advanced to the superolateral margin of the left femoral head under fluoroscopy. 1 ml of Lidocaine injected easily. Aspiration was performed. Additional aspiration was also performed with a 20 gauge spinal needle advanced more superiorly in the joint.  Lavage was performed through the 20 gauge needle with 10 mL of sterile saline. Aspirated fluid was sent for requested laboratory studies.  FINDINGS: There was inability to aspirate free fluid from the joint with only a small amount of blood aspirated initially. After sterile saline lavage, approximately 4 mL of slightly blood-tinged fluid was recovered and sent for laboratory testing.  IMPRESSION: Aspiration of left hip joint under fluoroscopy.   Electronically Signed   By: Aletta Edouard M.D.   On: 12/12/2014 08:23   Dg Hip Unilat With Pelvis 2-3 Views Left  12/11/2014   CLINICAL DATA:  Lateral left hip pain for 2-3 days. No known injury. Initial encounter.  EXAM: DG HIP (WITH OR WITHOUT PELVIS) 2-3V LEFT  COMPARISON:  None.  FINDINGS: There is no acute bony or joint abnormality. No evidence of avascular necrosis of the femoral heads is identified. No notable degenerative change is seen about the hips, symphysis pubis or sacroiliac joints. Imaged soft tissue structures are unremarkable.  IMPRESSION: Negative exam.   Electronically Signed   By: Inge Rise M.D.   On: 12/11/2014 15:05     Assessment/Plan   ICD-9-CM ICD-10-CM   1. Uncontrolled diabetes mellitus - due  to noncompliance 250.02 E11.65   2. MRSA bacteremia - on abx tx 790.7 R78.81    041.12 B95.62   3. Endocarditis - on abx 424.90 I38   4. Septic arthritis - due to septic emboli and MRSA bacteremia 711.00 M00.9   5. Status post left hip replacement - due to #4 V43.64 Z96.60   6. Essential hypertension - stable 401.9 I10     --refer to nutrition for diabetic education  --No floraster started due to PICC line  presence  --will discuss with SW regarding application for disability  --cont current meds as ordered. IV/po abx to be completed 9/6th  --PT/OT as ordered  --f/u with Ortho and ID as scheduled  --check CBC w diff, CMP and vanco trough on 8/4th. Pharmacy to dose vanco  --GOAL: short term rehab and d/c home when medically appropriate. Complete IV abx. Communicated with pt and nursing.  --will follow  Oletha Tolson S. Perlie Gold  [facility] [address] [place], [place] [address] [phone] Cell (Monday-Friday 8 AM - 5 PM) [phone] After 5 PM and follow prompts

## 2014-12-27 LAB — FUNGUS CULTURE W SMEAR: Fungal Smear: NONE SEEN

## 2015-01-11 ENCOUNTER — Other Ambulatory Visit: Payer: Self-pay | Admitting: *Deleted

## 2015-01-11 MED ORDER — OXYCODONE HCL 5 MG PO TABS: ORAL_TABLET | ORAL | Status: DC

## 2015-01-11 NOTE — Telephone Encounter (Signed)
[facility] 

## 2015-01-15 LAB — AFB CULTURE WITH SMEAR (NOT AT ARMC): ACID FAST SMEAR: NONE SEEN

## 2015-01-17 ENCOUNTER — Emergency Department (HOSPITAL_COMMUNITY)
Admission: EM | Admit: 2015-01-17 | Discharge: 2015-01-17 | Disposition: A | Discharge status: Home / Self Care | Payer: Medicaid Other | Attending: Emergency Medicine | Admitting: Emergency Medicine

## 2015-01-17 ENCOUNTER — Encounter (HOSPITAL_COMMUNITY): Payer: Self-pay

## 2015-01-17 DIAGNOSIS — R000 Tachycardia, unspecified: Secondary | ICD-10-CM | POA: Insufficient documentation

## 2015-01-17 DIAGNOSIS — E119 Type 2 diabetes mellitus without complications: Secondary | ICD-10-CM | POA: Insufficient documentation

## 2015-01-17 DIAGNOSIS — Y832 Surgical operation with anastomosis, bypass or graft as the cause of abnormal reaction of the patient, or of later complication, without mention of misadventure at the time of the procedure: Secondary | ICD-10-CM | POA: Insufficient documentation

## 2015-01-17 DIAGNOSIS — Z79899 Other long term (current) drug therapy: Secondary | ICD-10-CM | POA: Insufficient documentation

## 2015-01-17 DIAGNOSIS — T829XXA Unspecified complication of cardiac and vascular prosthetic device, implant and graft, initial encounter: Secondary | ICD-10-CM | POA: Diagnosis present

## 2015-01-17 DIAGNOSIS — I38 Endocarditis, valve unspecified: Secondary | ICD-10-CM

## 2015-01-17 DIAGNOSIS — I10 Essential (primary) hypertension: Secondary | ICD-10-CM | POA: Insufficient documentation

## 2015-01-17 DIAGNOSIS — Z794 Long term (current) use of insulin: Secondary | ICD-10-CM | POA: Insufficient documentation

## 2015-01-17 MED ORDER — VANCOMYCIN HCL IN DEXTROSE 750-5 MG/150ML-% IV SOLN
750.0000 mg | Freq: Once | INTRAVENOUS | Status: AC
  Administered 2015-01-17: 750 mg via INTRAVENOUS
  Filled 2015-01-17: qty 150

## 2015-01-17 NOTE — ED Notes (Signed)
PA at the bedside. Updating patient.

## 2015-01-17 NOTE — Discharge Instructions (Signed)
You received your dose of Vancomycin today in the ER. [facility] interventional radiology department will contact you tomorrow to have your PICC line placed tomorrow. If you have trouble getting an appointment please call [phone].    Infective Endocarditis Infective endocarditis is an infection of the inner layer of heart (endocardium) or the heart valves. An endocarditis infection is caused by many things, including viruses, bacteria, or fungi that enter your bloodstream. If left untreated, endocarditis can cause other problems, which may include:  Heart valve damage.  Blood clots (embolism).  Irregular heartbeat (arrhythmia).  Heart failure. CAUSES  Surgery, dental work, and intravenous (IV) drug abuse can allow bacteria or fungi to enter the bloodstream. Once in the bloodstream, bacteria can attach to the inner lining of the heart or heart valves, causing infection. SYMPTOMS   Fever and chills.  Skin rashes.  Heart murmurs.  Spleen enlargement.  Unexplained weight loss.  Shortness of breath.  Blood in urine.  Lethargy and fatigue.  Sudden weakness in face, arms, legs (possible stroke).  Night sweats. DIAGNOSIS   Endocarditis may be suspected if your caregiver hears a heart murmur, observes certain skin rashes, or there are changes in your eye exam.  Tests include:  Blood work.  Transthoracic echocardiogram (TTE). A TTE uses sound waves to produce images of the heart. A hand-held device is placed on the chest and transmits sound waves. These sound waves bounce off the heart to produce images that help your caregiver detect heart damage.  Transesophageal echocardiogram (TEE). For a TEE, a flexible tube is passed down your throat and into the esophagus. Because the esophagus is close to the heart, a TEE allows different images of the heart to be obtained. This type of procedure requires sedation. WHO IS AT RISK FOR INFECTIVE ENDOCARDITIS?  Those with a history of  endocarditis.  People with artificial (prosthetic) heart valves.  Cardiac transplant patients with valve disease.  People with congenital heart disease, including:  Those with unrepaired congenital heart defects.  Those with a congenital heart defect repaired with prosthetic materials. Within the first 6 months of the procedure, you are still at risk for infection. After 6 months, talk to your caregiver about your special needs.  Those who have had surgery to correct the defect but with some remaining problems at the site of repair. PREVENTION  If you are at the highest risk for infective endocarditis, you may need to take antibiotics before having dental work or other surgical procedures. These medicines help to prevent infective endocarditis. Let your dentist and your caregiver know if you have a history of any of the following so that the necessary precautions can be taken:  A ventricular septal defect (VSD).  A repaired VSD.  Endocarditis in the past.  A prosthetic heart valve. TREATMENT   IV antibiotic treatment may be needed for several weeks to treat endocarditis.  Surgery may be needed to remove the infected tissue in the heart.  Surgery to repair valve damage. MAKE SURE YOU:   Understand these instructions.  Will watch your condition.  Will get help right away if you are not doing well or get worse. Document Released: 05/08/2005 Document Revised: 09/02/2012 Document Reviewed: 09/19/2006 [facility] Patient Information 2015 [place], [place]. This information is not intended to replace advice given to you by your health care provider. Make sure you discuss any questions you have with your health care provider.

## 2015-01-17 NOTE — ED Notes (Signed)
Per Patient, Patient is coming from [facility] on [address]. Patient was receiving IV antibiotics for MRSA for the last thirty days. Patient's last day was to be tomorrow and he was going to go home. Patient was doing laundry this morning at he facility and the PICC line was pulled out of his right upper arm accidentally. Patient denies pain. Site is clean, dry, and intact. NO bleeding noted. Vitals per EMS: 144/94, 99%, 107 HR, 18 RR.

## 2015-01-17 NOTE — ED Notes (Signed)
Will PA at bedside updating pt on plan of care.

## 2015-01-17 NOTE — ED Provider Notes (Signed)
CSN: [id number]     Arrival date & time 01/17/15  1115 History   First MD Initiated Contact with Patient 01/17/15 1123     Chief Complaint  Patient presents with  . Vascular Access Problem   Marc [name] is a 54 y.o. male with a history of diabetes, hypertension and MRSA endocarditis who presents to the emergency department from [facility] [place] after his PICC line was accidentally pulled out. The patient has been receiving 750mg  IV vancomycin every 12 hours for his MRSA endocarditis at [facility]. He reports that today he was moving some laundry when the PICC line was caught on some clothing and axillary pulled it out. The patient reports that he "feels great." He has no complaints. He denies pain at the site. He denies fevers, chest pain, shortness of breath, palpitations, numbness, tingling, weakness, abdominal pain, nausea or vomiting. I called and spoke with the patient's nurse at [facility] he reports that the patient is due to have antibiotics until September 6. He is to be discharged tomorrow but will continue the IV antibiotics at home.  (Consider location/radiation/quality/duration/timing/severity/associated sxs/prior Treatment) HPI  Past Medical History  Diagnosis Date  . Diabetes mellitus without complication   . Hypertension   . Endocarditis    Past Surgical History  Procedure Laterality Date  . No past surgeries    . Video bronchoscopy Bilateral 12/03/2014    Procedure: VIDEO BRONCHOSCOPY WITHOUT FLUORO;  Surgeon: Coralyn Helling, MD;  Location: [facility] ENDOSCOPY;  Service: Cardiopulmonary;  Laterality: Bilateral;  . Total hip revision Left 12/14/2014    Procedure: Left hip resection with prostalac ;  Surgeon: Durene Romans, MD;  Location: [facility] ORS;  Service: Orthopedics;  Laterality: Left;   Family History  Problem Relation Age of Onset  . Hypertension Mother   . Diabetes Sister   . Diabetes Brother    Social History  Substance Use Topics  . Smoking  status: Never Smoker   . Smokeless tobacco: Never Used  . Alcohol Use: No     Comment: patient states he does not drink on 12/08/14    Review of Systems  Constitutional: Negative for fever and chills.  HENT: Negative for sore throat.   Eyes: Negative for visual disturbance.  Respiratory: Negative for cough and shortness of breath.   Cardiovascular: Negative for chest pain, palpitations and leg swelling.  Gastrointestinal: Negative for nausea, vomiting and abdominal pain.  Genitourinary: Negative for dysuria.  Musculoskeletal: Negative for myalgias, back pain and neck pain.  Skin: Negative for rash.  Neurological: Negative for weakness, light-headedness, numbness and headaches.      Allergies  Review of patient's allergies indicates no known allergies.  Home Medications   Prior to Admission medications   Medication Sig Start Date End Date Taking? Authorizing Provider  acetaminophen (TYLENOL) 325 MG tablet Take 2 tablets (650 mg total) by mouth every 6 (six) hours as needed for mild pain (or Fever >/= 101). 12/17/14   Lanney Gins, PA-C  alum & mag hydroxide-simeth (MAALOX/MYLANTA) 200-200-20 MG/5ML suspension Take 30 mLs by mouth every 6 (six) hours as needed for indigestion or heartburn. 11/22/14   Alison Murray, MD  amLODipine (NORVASC) 10 MG tablet Take 1 tablet (10 mg total) by mouth daily. 12/08/14   Jaclyn Shaggy, MD  bisacodyl (DULCOLAX) 10 MG suppository Place 1 suppository (10 mg total) rectally daily as needed for moderate constipation. 12/18/14   Elease Etienne, MD  docusate sodium (COLACE) 100 MG capsule Take 1 capsule (  100 mg total) by mouth 2 (two) times daily. 12/17/14   Lanney Gins, PA-C  ferrous sulfate 325 (65 FE) MG tablet Take 1 tablet (325 mg total) by mouth 3 (three) times daily after meals. 12/17/14   Lanney Gins, PA-C  folic acid (FOLVITE) 1 MG tablet Take 1 tablet (1 mg total) by mouth daily. 11/22/14   Alison Murray, MD  insulin aspart (NOVOLOG) 100 UNIT/ML  injection Inject 0-9 Units into the skin 3 (three) times daily with meals. CBG < 70: implement hypoglycemia protocol CBG 70 - 120: 0 units CBG 121 - 150: 1 unit CBG 151 - 200: 2 units CBG 201 - 250: 3 units CBG 251 - 300: 5 units CBG 301 - 350: 7 units CBG 351 - 400: 9 units CBG > 400: call MD. 12/18/14   Elease Etienne, MD  insulin glargine (LANTUS) 100 UNIT/ML injection Inject 0.15 mLs (15 Units total) into the skin at bedtime. 12/18/14   Elease Etienne, MD  lisinopril (PRINIVIL,ZESTRIL) 5 MG tablet Take 1 tablet (5 mg total) by mouth daily. Patient not taking: Reported on 12/11/2014 12/08/14   Jaclyn Shaggy, MD  Multiple Vitamin (MULTIVITAMIN WITH MINERALS) TABS tablet Take 1 tablet by mouth daily. 11/22/14   Alison Murray, MD  oxyCODONE (OXY IR/ROXICODONE) 5 MG immediate release tablet Take one to two tablets by mouth every 4 hours as needed for pain 01/11/15   Tiffany L Reed, DO  pantoprazole (PROTONIX) 40 MG tablet Take 1 tablet (40 mg total) by mouth daily. 11/22/14   Alison Murray, MD  polyethylene glycol [facility]) packet Take 17 g by mouth 2 (two) times daily. 12/18/14   Elease Etienne, MD  rifampin (RIFADIN) 300 MG capsule Take 1 capsule (300 mg total) by mouth daily. Complete on January 26, 2015. 12/18/14   Elease Etienne, MD  thiamine 100 MG tablet Take 1 tablet (100 mg total) by mouth daily. Patient not taking: Reported on 12/08/2014 11/22/14   Alison Murray, MD  vancomycin 1,250 mg in sodium chloride 0.9 % 250 mL Inject 1,250 mg into the vein every 12 (twelve) hours. Complete on January 26, 2015. 12/18/14   Elease Etienne, MD   BP 125/72 mmHg  Pulse 99  Temp(Src) 98.4 F (36.9 C) (Oral)  Resp 18  Ht  (1.803 m)  Wt 168 lb (76.204 kg)  BMI 23.44 kg/m2  SpO2 94% Physical Exam  Constitutional: He appears well-developed and well-nourished. No distress.  Nontoxic appearing.  HENT:  Head: Normocephalic and atraumatic.  Eyes: Conjunctivae are normal. Pupils are  equal, round, and reactive to light. Right eye exhibits no discharge. Left eye exhibits no discharge.  Neck: Neck supple.  Cardiovascular: Regular rhythm, normal heart sounds and intact distal pulses.   Slightly tachycardic with a heart rate of 108. Bilateral radial pulses are intact.  Pulmonary/Chest: Effort normal and breath sounds normal. No respiratory distress. He has no wheezes. He has no rales.  Abdominal: Soft. There is no tenderness.  Musculoskeletal: He exhibits no edema or tenderness.  Patient has good strength of his bilateral upper extremities.  Lymphadenopathy:    He has no cervical adenopathy.  Neurological: He is alert. Coordination normal.  Skin: Skin is warm and dry. No rash noted. He is not diaphoretic. No erythema. No pallor.  PICC line site to his right upper arm without bleeding, tenderness, edema or erythema. No warmth or ecchymosis.   Psychiatric: He has a normal mood and  affect. His behavior is normal.  Nursing note and vitals reviewed.   ED Course  Procedures (including critical care time) Labs Review Labs Reviewed - No data to display  Imaging Review No results found.    EKG Interpretation None      Filed Vitals:   01/17/15 1500 01/17/15 1513 01/17/15 1530 01/17/15 1536  BP: 139/80 139/80 125/72   Pulse: 98 108 99   Temp:    98.4 F (36.9 C)  TempSrc:    Oral  Resp:  18    Height:      Weight:      SpO2: 100% 100% 94%      MDM   Meds given in ED:  Medications  vancomycin (VANCOCIN) IVPB 750 mg/150 ml premix (750 mg Intravenous Given 01/17/15 1357)    Discharge Medication List as of 01/17/2015  3:26 PM      Final diagnoses:  Endocarditis   This is a 54 y.o. male with a history of diabetes, hypertension and MRSA endocarditis who presents to the emergency department from [facility] [place] after his PICC line was accidentally pulled out. The patient has been receiving 750mg  IV vancomycin every 12 hours for his MRSA endocarditis  at [facility]. He reports that today he was moving some laundry when the PICC line was caught on some clothing and axillary pulled it out. The patient reports that he "feels great." He has no complaints. He denies pain at the site. He denies fevers, chest pain, shortness of breath, palpitations.  On exam the patient is afebrile nontoxic appearing. There is no bleeding from his PICC line site. There is no arm edema or ecchymosis. There is no arm tenderness. I called and consulted with the patient's nurse at [facility] who reports that the patient is due to continue having IV vancomycin until September 6. He will need PICC line replacement.  I called and consulted with interventional radiologist Dr. Miles Costain who reports that since this is Sunday there is nobody available to do a nonemergent PICC line placement. He will have his scheduling department called [facility] to schedule his PICC line replacement for tomorrow. He advised that if he has trouble getting an appointment he should call [phone].  Today we will provide the patient with a dose of vancomycin via IV. I advised the patient the plan to have his PICC line replaced tomorrow. The patient agrees to this plan. I advised him to call the phone number if he has trouble getting an appointment. I also consulted with the on-call provider for the nursing facility doctor Glade Lloyd and advised her of the above. She reports she will make sure he gets his  PICC tomorrow.   Patient discharged back to [facility] after IV vancomycin. I advised the patient to follow-up with their primary care provider this week. I advised the patient to return to the emergency department with new or worsening symptoms or new concerns. The patient verbalized understanding and agreement with plan.    This patient was discussed with Dr. Fredderick Phenix who agrees with assessment and plan.    Marc Farrier, PA-C 01/17/15 1609  Rolan Bucco, MD 01/18/15 [phone]

## 2015-01-17 NOTE — ED Notes (Signed)
PA Will updated pt. PT waiting to go back to facility.

## 2015-01-18 ENCOUNTER — Other Ambulatory Visit (HOSPITAL_COMMUNITY): Payer: Self-pay | Admitting: Internal Medicine

## 2015-01-18 ENCOUNTER — Ambulatory Visit (HOSPITAL_COMMUNITY)
Admission: RE | Admit: 2015-01-18 | Discharge: 2015-01-18 | Disposition: A | Discharge status: Home / Self Care | Payer: Medicaid Other | Source: Ambulatory Visit | Attending: Internal Medicine | Admitting: Internal Medicine

## 2015-01-18 DIAGNOSIS — I38 Endocarditis, valve unspecified: Secondary | ICD-10-CM

## 2015-01-18 DIAGNOSIS — I330 Acute and subacute infective endocarditis: Secondary | ICD-10-CM | POA: Diagnosis not present

## 2015-01-18 DIAGNOSIS — B9689 Other specified bacterial agents as the cause of diseases classified elsewhere: Secondary | ICD-10-CM | POA: Diagnosis not present

## 2015-01-18 MED ORDER — HEPARIN SOD (PORK) LOCK FLUSH 100 UNIT/ML IV SOLN
INTRAVENOUS | Status: AC
  Filled 2015-01-18: qty 5

## 2015-01-18 MED ORDER — LIDOCAINE HCL 1 % IJ SOLN
INTRAMUSCULAR | Status: AC
  Filled 2015-01-18: qty 20

## 2015-01-18 NOTE — Procedures (Signed)
Successful placement of single lumen PICC line to right basilic vein. Length 36 cm Tip at lower SVC/RA No complications Ready for use.  Avleen Bordwell S Rennie Hack PA-C @

## 2015-01-19 ENCOUNTER — Ambulatory Visit (INDEPENDENT_AMBULATORY_CARE_PROVIDER_SITE_OTHER): Payer: Self-pay | Admitting: Infectious Disease

## 2015-01-19 ENCOUNTER — Encounter: Payer: Self-pay | Admitting: Infectious Disease

## 2015-01-19 VITALS — BP 148/84 | HR 108 | Temp 98.2°F | Wt 170.0 lb

## 2015-01-19 DIAGNOSIS — I76 Septic arterial embolism: Secondary | ICD-10-CM

## 2015-01-19 DIAGNOSIS — E1165 Type 2 diabetes mellitus with hyperglycemia: Secondary | ICD-10-CM

## 2015-01-19 DIAGNOSIS — R7881 Bacteremia: Secondary | ICD-10-CM

## 2015-01-19 DIAGNOSIS — B9562 Methicillin resistant Staphylococcus aureus infection as the cause of diseases classified elsewhere: Secondary | ICD-10-CM

## 2015-01-19 DIAGNOSIS — M86159 Other acute osteomyelitis, unspecified femur: Secondary | ICD-10-CM | POA: Insufficient documentation

## 2015-01-19 DIAGNOSIS — IMO0002 Reserved for concepts with insufficient information to code with codable children: Secondary | ICD-10-CM

## 2015-01-19 DIAGNOSIS — M86152 Other acute osteomyelitis, left femur: Secondary | ICD-10-CM

## 2015-01-19 DIAGNOSIS — A4102 Sepsis due to Methicillin resistant Staphylococcus aureus: Secondary | ICD-10-CM

## 2015-01-19 DIAGNOSIS — M009 Pyogenic arthritis, unspecified: Secondary | ICD-10-CM

## 2015-01-19 DIAGNOSIS — I2690 Septic pulmonary embolism without acute cor pulmonale: Secondary | ICD-10-CM

## 2015-01-19 HISTORY — DX: Other acute osteomyelitis, unspecified femur: M86.159

## 2015-01-19 NOTE — Patient Instructions (Addendum)
Your vancomycin needs to be EXTENDED TO 02/10/15 since this infection involved BONE in your FEMUR  I would like to have an ESR and CRP checked with your blood work weekly  You should be seen by [place] in this clinic prior to the [place] to [place] with [employer] on the 19th if no other slots with ID clinic MD

## 2015-01-19 NOTE — Progress Notes (Signed)
Subjective:    Patient ID: Marc [name], male    DOB: [birth year], 54 y.o.   MRN: [id number]  HPI  54 year old African American man with poorly controlled DM requiring insulin who was admitted to [facility] with MRSA bacteremia and picture consistent with septic emboli to the lungs from right sided endocarditis (note TEE not pursued since diagnosis was made clinically and TTE did not show a large vegetation). 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 He underwent surgery on July 26th by Dr. Alvan Dame with:  Left hip resection arthroplasty, placement of temporary antibiotic cemented total hip arthroplasty from [place]  Patient's head of the femur was unhealthy appearing and Dr Alvan Dame resected it and sent for pathology though I cannot find the path report.   He has been on IV vancomycin and I believe oral rifampin since then.  He feels much better. No cough, no chest pain, no palpitations. His left hip had some postoperative swelling that is better now and his pain is much improved now largely with weight bearing  PICC line was recently replaced.    Review of Systems  Constitutional: Negative for fever, chills, diaphoresis, activity change, appetite change, fatigue and unexpected weight change.  HENT: Negative for congestion, rhinorrhea, sinus pressure, sneezing, sore throat and trouble swallowing.   Eyes: Negative for photophobia and visual disturbance.  Respiratory: Negative for cough, chest tightness, shortness of breath, wheezing and stridor.   Cardiovascular: Negative for chest pain, palpitations and leg swelling.  Gastrointestinal: Negative for nausea, vomiting, abdominal pain, diarrhea, constipation, blood in stool, abdominal distention and anal bleeding.  Genitourinary: Negative for dysuria, hematuria, flank pain and difficulty urinating.  Musculoskeletal:  Positive for gait problem. Negative for myalgias, back pain, joint swelling and arthralgias.  Skin: Positive for wound. Negative for color change, pallor and rash.  Neurological: Negative for dizziness, tremors, weakness and light-headedness.  Hematological: Negative for adenopathy. Does not bruise/bleed easily.  Psychiatric/Behavioral: Negative for behavioral problems, confusion, sleep disturbance, dysphoric mood, decreased concentration and agitation.       Objective:   Physical Exam  Constitutional: He is oriented to person, place, and time. He appears well-developed and well-nourished.  HENT:  Head: Normocephalic and atraumatic.  Eyes: Conjunctivae and EOM are normal.  Neck: Normal range of motion. Neck supple.  Cardiovascular: Normal rate, regular rhythm and intact distal pulses.   Murmur heard. Pulmonary/Chest: Effort normal and breath sounds normal. No respiratory distress. He has no wheezes. He has no rales. He exhibits no tenderness.  Abdominal: Soft. Bowel sounds are normal. He exhibits no distension. There is no tenderness. There is no rebound.  Musculoskeletal: Normal range of motion. He exhibits no edema.  Neurological: He is alert and oriented to person, place, and time.  Skin: Skin is warm and dry. No erythema.  Psychiatric: He has a normal mood and affect. His behavior is normal. Thought content normal.    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 Getting insulin  ? AVN: per Dr Alvan Dame from [place]. No path sent? : he had prior drinking hx> I suspect that this is simply due to terrible MRSA infection  I spent greater than 40 minutes with the patient including greater than 50% of time in face to face counsel of the patient re his MRSA bacteremia, endocarditis, septic hip, osteo of the femur and his diabetes, and ? AVN and in coordination of their care.

## 2015-01-21 ENCOUNTER — Telehealth: Payer: Self-pay | Admitting: *Deleted

## 2015-01-21 ENCOUNTER — Non-Acute Institutional Stay (SKILLED_NURSING_FACILITY): Payer: Medicaid Other | Admitting: Adult Health

## 2015-01-21 DIAGNOSIS — E1310 Other specified diabetes mellitus with ketoacidosis without coma: Secondary | ICD-10-CM | POA: Diagnosis not present

## 2015-01-21 DIAGNOSIS — I10 Essential (primary) hypertension: Secondary | ICD-10-CM

## 2015-01-21 DIAGNOSIS — M86152 Other acute osteomyelitis, left femur: Secondary | ICD-10-CM

## 2015-01-21 DIAGNOSIS — E1110 Type 2 diabetes mellitus with ketoacidosis without coma: Secondary | ICD-10-CM

## 2015-01-21 NOTE — Telephone Encounter (Signed)
Call from [facility], (skilled nursing facility) from [facility] [phone] stating patient has been signing himself out and leaving. He has also been "ornerry and mean to the staff". They were wondering if he could possible be discharged home with IV antibiotic therapy or go on oral medication. There is also an issue with the payor source as he has none and his care is being covered by [facility]. Spoke with Dr. Daiva Eves and due to the seriousness of this patient's infection he needs to remain on the current antibiotic. Eunice Blase is going to relay this to her supervisor and they will work with the patient and try to keep him at the SNF. She will call [facility] back to keep [place] informed.

## 2015-02-01 ENCOUNTER — Telehealth: Payer: Self-pay | Admitting: Infectious Diseases

## 2015-02-01 NOTE — Telephone Encounter (Signed)
Per snf he has had rash.no new rx's  i have asked them to pull pic and stop vanco. He has recieved ~ 6 weeks hhe has f/u with Dr Daiva Eves next week.

## 2015-02-03 ENCOUNTER — Non-Acute Institutional Stay (SKILLED_NURSING_FACILITY): Payer: Medicaid Other | Admitting: Adult Health

## 2015-02-03 DIAGNOSIS — Z794 Long term (current) use of insulin: Secondary | ICD-10-CM | POA: Diagnosis not present

## 2015-02-03 DIAGNOSIS — I38 Endocarditis, valve unspecified: Secondary | ICD-10-CM | POA: Diagnosis not present

## 2015-02-03 DIAGNOSIS — E118 Type 2 diabetes mellitus with unspecified complications: Secondary | ICD-10-CM

## 2015-02-03 DIAGNOSIS — IMO0002 Reserved for concepts with insufficient information to code with codable children: Secondary | ICD-10-CM

## 2015-02-03 DIAGNOSIS — I10 Essential (primary) hypertension: Secondary | ICD-10-CM

## 2015-02-03 DIAGNOSIS — E1165 Type 2 diabetes mellitus with hyperglycemia: Secondary | ICD-10-CM

## 2015-02-04 ENCOUNTER — Telehealth: Payer: Self-pay | Admitting: Infectious Disease

## 2015-02-04 NOTE — Telephone Encounter (Signed)
Received word that his vancomycin has stopped.   Please have his SNF start him on doxycycline  po bid to be continued for another month minimum with followup with [place] closely   He has appt next week with me

## 2015-02-05 ENCOUNTER — Other Ambulatory Visit: Payer: Self-pay | Admitting: *Deleted

## 2015-02-05 DIAGNOSIS — M8610 Other acute osteomyelitis, unspecified site: Secondary | ICD-10-CM

## 2015-02-05 MED ORDER — DOXYCYCLINE HYCLATE 100 MG PO TABS: 100.0000 mg | ORAL_TABLET | Freq: Two times a day (BID) | ORAL | Status: DC

## 2015-02-05 NOTE — Telephone Encounter (Signed)
Patient self-discharged from [facility] on 9/14 [phone]).  His PICC was removed, but he was not started on oral antibiotics.  Will send doxy  BID #[id number] to his pharmacy. Unable to contact patient, left message on home number asking patient to call and confirm new medication and upcoming appointments. Andree Coss, RN

## 2015-02-05 NOTE — Telephone Encounter (Signed)
Oh well I sure hope he makes his appt his FEMUR was infected along with his joint.

## 2015-02-08 ENCOUNTER — Telehealth: Payer: Self-pay | Admitting: Family Medicine

## 2015-02-08 ENCOUNTER — Ambulatory Visit: Payer: Self-pay | Admitting: Infectious Disease

## 2015-02-08 NOTE — Telephone Encounter (Signed)
Patient called stating that he lost the Rx for oxyCODONE (OXY IR/ROXICODONE) 5 MG immediate release tablet that was prescribed at the nursing home where he was living. Patient requested a new Rx. Please f/u

## 2015-02-11 ENCOUNTER — Encounter: Payer: Self-pay | Admitting: Family Medicine

## 2015-02-11 ENCOUNTER — Ambulatory Visit: Payer: Medicaid Other | Attending: Family Medicine | Admitting: Family Medicine

## 2015-02-11 VITALS — BP 145/98 | HR 109 | Temp 98.2°F | Ht 70.0 in | Wt 167.2 lb

## 2015-02-11 DIAGNOSIS — M8619 Other acute osteomyelitis, multiple sites: Secondary | ICD-10-CM | POA: Diagnosis not present

## 2015-02-11 DIAGNOSIS — I38 Endocarditis, valve unspecified: Secondary | ICD-10-CM | POA: Insufficient documentation

## 2015-02-11 DIAGNOSIS — E1165 Type 2 diabetes mellitus with hyperglycemia: Secondary | ICD-10-CM | POA: Diagnosis not present

## 2015-02-11 DIAGNOSIS — B3749 Other urogenital candidiasis: Secondary | ICD-10-CM | POA: Diagnosis not present

## 2015-02-11 DIAGNOSIS — Z794 Long term (current) use of insulin: Secondary | ICD-10-CM | POA: Diagnosis not present

## 2015-02-11 DIAGNOSIS — D500 Iron deficiency anemia secondary to blood loss (chronic): Secondary | ICD-10-CM | POA: Diagnosis not present

## 2015-02-11 DIAGNOSIS — M8610 Other acute osteomyelitis, unspecified site: Secondary | ICD-10-CM

## 2015-02-11 DIAGNOSIS — I2690 Septic pulmonary embolism without acute cor pulmonale: Secondary | ICD-10-CM | POA: Insufficient documentation

## 2015-02-11 DIAGNOSIS — I10 Essential (primary) hypertension: Secondary | ICD-10-CM | POA: Diagnosis not present

## 2015-02-11 DIAGNOSIS — Z91199 Patient's noncompliance with other medical treatment and regimen due to unspecified reason: Secondary | ICD-10-CM

## 2015-02-11 DIAGNOSIS — I76 Septic arterial embolism: Secondary | ICD-10-CM

## 2015-02-11 DIAGNOSIS — Z9660 Presence of unspecified orthopedic joint implant: Secondary | ICD-10-CM | POA: Diagnosis not present

## 2015-02-11 DIAGNOSIS — Z9119 Patient's noncompliance with other medical treatment and regimen: Secondary | ICD-10-CM | POA: Diagnosis not present

## 2015-02-11 DIAGNOSIS — Z96642 Presence of left artificial hip joint: Secondary | ICD-10-CM

## 2015-02-11 LAB — CBC WITH DIFFERENTIAL/PLATELET
Basophils Absolute: 0.1 10*3/uL (ref 0.0–0.1)
Basophils Relative: 1 % (ref 0–1)
EOS ABS: 2.8 10*3/uL — AB (ref 0.0–0.7)
Eosinophils Relative: 21 % — ABNORMAL HIGH (ref 0–5)
HEMATOCRIT: 33.1 % — AB (ref 39.0–52.0)
Hemoglobin: 10.4 g/dL — ABNORMAL LOW (ref 13.0–17.0)
LYMPHS ABS: 1.6 10*3/uL (ref 0.7–4.0)
Lymphocytes Relative: 12 % (ref 12–46)
MCH: 23.7 pg — ABNORMAL LOW (ref 26.0–34.0)
MCHC: 31.4 g/dL (ref 30.0–36.0)
MCV: 75.6 fL — ABNORMAL LOW (ref 78.0–100.0)
MONO ABS: 0.9 10*3/uL (ref 0.1–1.0)
MONOS PCT: 7 % (ref 3–12)
NEUTROS PCT: 59 % (ref 43–77)
Neutro Abs: 7.7 10*3/uL (ref 1.7–7.7)
Platelets: 438 10*3/uL — ABNORMAL HIGH (ref 150–400)
RBC: 4.38 MIL/uL (ref 4.22–5.81)
RDW: 17.9 % — AB (ref 11.5–15.5)
WBC: 13.1 10*3/uL — ABNORMAL HIGH (ref 4.0–10.5)

## 2015-02-11 LAB — GLUCOSE, POCT (MANUAL RESULT ENTRY): POC GLUCOSE: 185 mg/dL — AB (ref 70–99)

## 2015-02-11 MED ORDER — IBUPROFEN 600 MG PO TABS: 600.0000 mg | ORAL_TABLET | Freq: Four times a day (QID) | ORAL | Status: DC | PRN

## 2015-02-11 MED ORDER — DOCUSATE SODIUM 100 MG PO CAPS: 100.0000 mg | ORAL_CAPSULE | Freq: Two times a day (BID) | ORAL | Status: DC

## 2015-02-11 MED ORDER — LISINOPRIL 5 MG PO TABS: 5.0000 mg | ORAL_TABLET | Freq: Every day | ORAL | Status: DC

## 2015-02-11 MED ORDER — DOXYCYCLINE HYCLATE 100 MG PO TABS: 100.0000 mg | ORAL_TABLET | Freq: Two times a day (BID) | ORAL | Status: DC

## 2015-02-11 MED ORDER — INSULIN ASPART 100 UNIT/ML ~~LOC~~ SOLN: 0.0000 [IU] | Freq: Three times a day (TID) | SUBCUTANEOUS | Status: DC

## 2015-02-11 MED ORDER — PANTOPRAZOLE SODIUM 40 MG PO TBEC
40.0000 mg | DELAYED_RELEASE_TABLET | Freq: Every day | ORAL | Status: DC
Start: 2015-02-11 — End: 2015-02-17

## 2015-02-11 MED ORDER — CLOTRIMAZOLE 1 % EX CREA: 1.0000 "application " | TOPICAL_CREAM | Freq: Two times a day (BID) | CUTANEOUS | Status: DC

## 2015-02-11 MED ORDER — INSULIN GLARGINE 100 UNIT/ML ~~LOC~~ SOLN: 15.0000 [IU] | Freq: Every day | SUBCUTANEOUS | Status: DC

## 2015-02-11 MED ORDER — FERROUS SULFATE 325 (65 FE) MG PO TABS: 325.0000 mg | ORAL_TABLET | Freq: Three times a day (TID) | ORAL | Status: DC

## 2015-02-11 MED ORDER — AMLODIPINE BESYLATE 10 MG PO TABS: 10.0000 mg | ORAL_TABLET | Freq: Every day | ORAL | Status: DC

## 2015-02-11 NOTE — Progress Notes (Addendum)
Subjective:  Patient ID: Marc [name], male    DOB: [birth year]  Age: 55 y.o. 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 Medical records indicate he self discharged but the patient insisted he was properly discharged.  He had been on IV vancomycin and are rifampin which he was supposed to take until 02/10/15 but was stopped a week ago when patient left the facility and as per the patient's chart infectious disease had called in doxycycline to his pharmacy which the patient never picked up. At his office visit today he states he has lost all his medications and is not taking anything; he received 360 oxycodone pills on 01/11/15 but he states his prescription got wet and he was unable to fill it. He is unhappy with his stay at the facility and states "they did not treat me right,. Get them, get along also done them, they made me breakout in a rash"  He complains of a pruritic scrotal rash. He has persisting pain in his left hip and ambulates with the aid of a walker; he has an upcoming appointment with his orthopedic surgeon in one week.  Past Medical History  Diagnosis Date  . Diabetes mellitus without complication   . Hypertension   . Endocarditis   . Acute osteomyelitis of femur 01/19/2015    Past Surgical History  Procedure Laterality Date  . No past surgeries    . Video  bronchoscopy Bilateral 12/03/2014    Procedure: VIDEO BRONCHOSCOPY WITHOUT FLUORO;  Surgeon: Coralyn Helling, MD;  Location: [facility];  Service: Cardiopulmonary;  Laterality: Bilateral;  . Total hip revision Left 12/14/2014    Procedure: Left hip resection with prostalac ;  Surgeon: Durene Romans, MD;  Location: [facility];  Service: Orthopedics;  Laterality: Left;    Social History   Social History  . Marital Status: Single    Spouse Name: N/A  . Number of Children: N/A  . Years of Education: N/A   Occupational History  . Not on file.   Social History Main Topics  . Smoking status: Never Smoker   . Smokeless tobacco: Never Used  . Alcohol Use: No     Comment: patient states he does not drink on 12/08/14  . Drug Use: No  . Sexual Activity: Not on file   Other Topics Concern  . Not on file   Social History Narrative    No Known Allergies  Outpatient Prescriptions Prior to Visit  Medication Sig Dispense Refill  . acetaminophen (TYLENOL) 325 MG tablet Take 2 tablets (650 mg total) by mouth every 6 (six) hours as needed for mild pain (or Fever >/= 101).    Marland Kitchen alum & mag hydroxide-simeth (MAALOX/MYLANTA) 200-200-20 MG/5ML suspension Take 30 mLs by mouth every 6 (six) hours as needed for indigestion or heartburn. 355 mL 0  . bisacodyl (DULCOLAX) 10 MG suppository Place 1 suppository (10 mg total) rectally daily as needed for moderate constipation.    Marland Kitchen  folic acid (FOLVITE) 1 MG tablet Take 1 tablet (1 mg total) by mouth daily. 30 tablet 0  . Multiple Vitamin (MULTIVITAMIN WITH MINERALS) TABS tablet Take 1 tablet by mouth daily. 30 tablet 0  . oxyCODONE (OXY IR/ROXICODONE) 5 MG immediate release tablet Take one to two tablets by mouth every 4 hours as needed for pain 360 tablet 0  . polyethylene glycol (MIRALAX / GLYCOLAX) packet Take 17 g by mouth 2 (two) times daily.    . rifampin (RIFADIN) 300 MG capsule Take 1 capsule (300 mg total) by mouth daily. Complete on January 26, 2015.    .  thiamine 100 MG tablet Take 1 tablet (100 mg total) by mouth daily. 30 tablet 0  . vancomycin 1,250 mg in sodium chloride 0.9 % 250 mL Inject 1,250 mg into the vein every 12 (twelve) hours. Complete on January 26, 2015.    Marland Kitchen amLODipine (NORVASC) 10 MG tablet Take 1 tablet (10 mg total) by mouth daily. 30 tablet 2  . docusate sodium (COLACE) 100 MG capsule Take 1 capsule (100 mg total) by mouth 2 (two) times daily. 10 capsule 0  . doxycycline (VIBRA-TABS) 100 MG tablet Take 1 tablet (100 mg total) by mouth 2 (two) times daily. 60 tablet 0  . ferrous sulfate 325 (65 FE) MG tablet Take 1 tablet (325 mg total) by mouth 3 (three) times daily after meals.  3  . insulin aspart (NOVOLOG) 100 UNIT/ML injection Inject 0-9 Units into the skin 3 (three) times daily with meals. CBG < 70: implement hypoglycemia protocol CBG 70 - 120: 0 units CBG 121 - 150: 1 unit CBG 151 - 200: 2 units CBG 201 - 250: 3 units CBG 251 - 300: 5 units CBG 301 - 350: 7 units CBG 351 - 400: 9 units CBG > 400: call MD.    . insulin glargine (LANTUS) 100 UNIT/ML injection Inject 0.15 mLs (15 Units total) into the skin at bedtime.    Marland Kitchen lisinopril (PRINIVIL,ZESTRIL) 5 MG tablet Take 1 tablet (5 mg total) by mouth daily. 30 tablet 3  . pantoprazole (PROTONIX) 40 MG tablet Take 1 tablet (40 mg total) by mouth daily. 30 tablet 0   No facility-administered medications prior to visit.    ROS Review of Systems  Constitutional: Negative for activity change and appetite change.  HENT: Negative for sinus pressure and sore throat.   Eyes: Negative for visual disturbance.  Respiratory: Negative for cough, chest tightness and shortness of breath.   Cardiovascular: Negative for chest pain and leg swelling.  Gastrointestinal: Negative for abdominal pain, diarrhea, constipation and abdominal distention.  Endocrine: Negative.   Genitourinary: Negative for dysuria.  Musculoskeletal: Negative for myalgias and joint swelling.       See  history of present illness   Skin: Negative for rash.  Allergic/Immunologic: Negative.   Neurological: Negative for weakness, light-headedness and numbness.  Psychiatric/Behavioral: Negative for suicidal ideas and dysphoric mood.    Objective:  BP 145/98 mmHg  Pulse 109  Temp(Src) 98.2 F (36.8 C)  Ht 5\' 10"  (1.778 m)  Wt 167 lb 3.2 oz (75.841 kg)  BMI 23.99 kg/m2  SpO2 97%  BP/Weight 02/11/2015 01/19/2015 01/17/2015  Systolic BP 145 148 125  Diastolic BP 98 84 72  Wt. (Lbs) 167.2 170 168  BMI 23.99 23.72 23.44    Lab Results  Component Value Date   WBC 11.8* 12/17/2014   HGB 8.1* 12/17/2014   HCT 25.5* 12/17/2014   PLT 402* 12/17/2014  GLUCOSE 97 12/17/2014   ALT 35 12/18/2014   AST 21 12/18/2014   NA 139 12/17/2014   K 3.8 12/17/2014   CL 103 12/17/2014   CREATININE 0.77 12/17/2014   BUN 11 12/17/2014   CO2 30 12/17/2014   TSH 1.053 11/18/2014   INR 1.15 12/01/2014   HGBA1C 12.1* 11/16/2014    Physical Exam  Constitutional: He is oriented to person, place, and time. He appears well-developed and well-nourished.  HENT:  Head: Normocephalic and atraumatic.  Right Ear: External ear normal.  Left Ear: External ear normal.  Neck: Normal range of motion. Neck supple. No tracheal deviation present.  Cardiovascular: Regular rhythm and normal heart sounds.  Tachycardia present.   No murmur heard. Pulmonary/Chest: Effort normal and breath sounds normal. No respiratory distress. He has no wheezes. He exhibits no tenderness.  Abdominal: Soft. Bowel sounds are normal. He exhibits no mass. There is no tenderness.  Musculoskeletal: He exhibits tenderness (on range of motion of the left hip; severely decreased range of motion of the left hip.). He exhibits no edema.  Neurological: He is alert and oriented to person, place, and time.  Skin: Skin is warm and dry. Rash (Candida rash in groin in between thigh fold and around scrotum) noted.  Psychiatric: He has a normal mood  and affect.     Assessment & Plan:   1. Type 2 diabetes mellitus with hyperglycemia Uncontrolled with A1c of 12.1. He has been off all his medications which I have refilled today Will review blood sugar log at next visit - Glucose (CBG) - Comprehensive metabolic panel - CBC with Differential - HgB A1c - Microalbumin/Creatinine Ratio, Urine - insulin aspart (NOVOLOG) 100 UNIT/ML injection; Inject 0-9 Units into the skin 3 (three) times daily with meals. CBG < 70: implement hypoglycemia protocol CBG 70 - 120: 0 units CBG 121 - 150: 1 unit CBG 151 - 200: 2 units CBG 201 - 250: 3 units CBG 251 - 300: 5 units CBG 301 - 350: 7 units CBG 351 - 400: 9 units CBG > 400: call MD.  Dispense: 10 mL; Refill: 3 - insulin glargine (LANTUS) 100 UNIT/ML injection; Inject 0.15 mLs (15 Units total) into the skin at bedtime.  Dispense: 10 mL; Refill: 3  2. Acute osteomyelitis He never completed his course of IV vancomycin as he stopped a week prior to the completion date of 02/10/15 advised to pick up the doxycycline which was prescribed by infectious disease. I have prescribed Ibuprofen and have obtained an appointment with infectious disease for him for next week. - doxycycline (VIBRA-TABS) 100 MG tablet; Take 1 tablet (100 mg total) by mouth 2 (two) times daily.  Dispense: 60 tablet; Refill: 0 - ibuprofen (ADVIL,MOTRIN) 600 MG tablet; Take 1 tablet (600 mg total) by mouth every 6 (six) hours as needed.  Dispense: 30 tablet; Refill: 0  3. Essential hypertension Uncontrolled due to being off his antihypertensives - amLODipine (NORVASC) 10 MG tablet; Take 1 tablet (10 mg total) by mouth daily.  Dispense: 30 tablet; Refill: 2 - lisinopril (PRINIVIL,ZESTRIL) 5 MG tablet; Take 1 tablet (5 mg total) by mouth daily.  Dispense: 30 tablet; Refill: 3  4. Endocarditis Asymptomatic. Treated.  5. Non-compliance with treatment Implications of noncompliance discussed with the patient  6. Septic  embolism  7. Status post left hip replacement He was not able to complete his physical therapy because he had no insurance as per patient. I have informed him I will not be refilling any narcotics  for him given he received a prescription for oxycodone even though he insists he never filled the prescription. Scheduled to see orthopedics on 02/19/15  8. Iron deficiency anemia due to chronic blood loss We will need to workup possible etiologies CBC ordered today - ferrous sulfate 325 (65 FE) MG tablet; Take 1 tablet (325 mg total) by mouth 3 (three) times daily after meals.; Refill: 3  9. Candidiasis of perineum Treated - clotrimazole (CLOTRIMAZOLE AF) 1 % cream; Apply 1 application topically 2 (two) times daily.  Dispense: 30 g; Refill: 1   Meds ordered this encounter  Medications  . insulin aspart (NOVOLOG) 100 UNIT/ML injection    Sig: Inject 0-9 Units into the skin 3 (three) times daily with meals. CBG < 70: implement hypoglycemia protocol CBG 70 - 120: 0 units CBG 121 - 150: 1 unit CBG 151 - 200: 2 units CBG 201 - 250: 3 units CBG 251 - 300: 5 units CBG 301 - 350: 7 units CBG 351 - 400: 9 units CBG > 400: call MD.    Dispense:  10 mL    Refill:  3  . amLODipine (NORVASC) 10 MG tablet    Sig: Take 1 tablet (10 mg total) by mouth daily.    Dispense:  30 tablet    Refill:  2  . doxycycline (VIBRA-TABS) 100 MG tablet    Sig: Take 1 tablet (100 mg total) by mouth 2 (two) times daily.    Dispense:  60 tablet    Refill:  0  . insulin glargine (LANTUS) 100 UNIT/ML injection    Sig: Inject 0.15 mLs (15 Units total) into the skin at bedtime.    Dispense:  10 mL    Refill:  3  . ferrous sulfate 325 (65 FE) MG tablet    Sig: Take 1 tablet (325 mg total) by mouth 3 (three) times daily after meals.    Refill:  3  . pantoprazole (PROTONIX) 40 MG tablet    Sig: Take 1 tablet (40 mg total) by mouth daily.    Dispense:  30 tablet    Refill:  3  . lisinopril (PRINIVIL,ZESTRIL) 5 MG  tablet    Sig: Take 1 tablet (5 mg total) by mouth daily.    Dispense:  30 tablet    Refill:  3  . docusate sodium (COLACE) 100 MG capsule    Sig: Take 1 capsule (100 mg total) by mouth 2 (two) times daily.    Dispense:  60 capsule    Refill:  2  . clotrimazole (CLOTRIMAZOLE AF) 1 % cream    Sig: Apply 1 application topically 2 (two) times daily.    Dispense:  30 g    Refill:  1  . ibuprofen (ADVIL,MOTRIN) 600 MG tablet    Sig: Take 1 tablet (600 mg total) by mouth every 6 (six) hours as needed.    Dispense:  30 tablet    Refill:  0    Follow-up: Return in about 1 week (around 02/18/2015) for TCC follow-up with Dr.Amao.   Jaclyn Shaggy MD

## 2015-02-11 NOTE — Progress Notes (Signed)
Patient states he was discharged from [facility] and did not leave AMA He has lost all medications and has not taken anything in several days His CBG was 185 Blood pressure is 145/98 pulse 109 He states he needs a hip replacement and is using a walker today He reports his pain is a 3/10 in his left hip He states he got a Rx for oxycodone but it ripped and the pharmacy would not fill it.

## 2015-02-11 NOTE — Patient Instructions (Signed)
Diabetes Mellitus and Food It is important for you to manage your blood sugar (glucose) level. Your blood glucose level can be greatly affected by what you eat. Eating healthier foods in the appropriate amounts throughout the day at about the same time each day will help you control your blood glucose level. It can also help slow or prevent worsening of your diabetes mellitus. Healthy eating may even help you improve the level of your blood pressure and reach or maintain a healthy weight.  HOW CAN FOOD AFFECT ME? Carbohydrates Carbohydrates affect your blood glucose level more than any other type of food. Your dietitian will help you determine how many carbohydrates to eat at each meal and teach you how to count carbohydrates. Counting carbohydrates is important to keep your blood glucose at a healthy level, especially if you are using insulin or taking certain medicines for diabetes mellitus. Alcohol Alcohol can cause sudden decreases in blood glucose (hypoglycemia), especially if you use insulin or take certain medicines for diabetes mellitus. Hypoglycemia can be a life-threatening condition. Symptoms of hypoglycemia (sleepiness, dizziness, and disorientation) are similar to symptoms of having too much alcohol.  If your health care provider has given you approval to drink alcohol, do so in moderation and use the following guidelines:  Women should not have more than one drink per day, and men should not have more than two drinks per day. One drink is equal to:  12 oz of beer.  5 oz of wine.  1 oz of hard liquor.  Do not drink on an empty stomach.  Keep yourself hydrated. Have water, diet soda, or unsweetened iced tea.  Regular soda, juice, and other mixers might contain a lot of carbohydrates and should be counted. WHAT FOODS ARE NOT RECOMMENDED? As you make food choices, it is important to remember that all foods are not the same. Some foods have fewer nutrients per serving than other  foods, even though they might have the same number of calories or carbohydrates. It is difficult to get your body what it needs when you eat foods with fewer nutrients. Examples of foods that you should avoid that are high in calories and carbohydrates but low in nutrients include:  Trans fats (most processed foods list trans fats on the Nutrition Facts label).  Regular soda.  Juice.  Candy.  Sweets, such as cake, pie, doughnuts, and cookies.  Fried foods. WHAT FOODS CAN I EAT? Have nutrient-rich foods, which will nourish your body and keep you healthy. The food you should eat also will depend on several factors, including:  The calories you need.  The medicines you take.  Your weight.  Your blood glucose level.  Your blood pressure level.  Your cholesterol level. You also should eat a variety of foods, including:  Protein, such as meat, poultry, fish, tofu, nuts, and seeds (lean animal proteins are best).  Fruits.  Vegetables.  Dairy products, such as milk, cheese, and yogurt (low fat is best).  Breads, grains, pasta, cereal, rice, and beans.  Fats such as olive oil, trans fat-free margarine, canola oil, avocado, and olives. DOES EVERYONE WITH DIABETES MELLITUS HAVE THE SAME MEAL PLAN? Because every person with diabetes mellitus is different, there is not one meal plan that works for everyone. It is very important that you meet with a dietitian who will help you create a meal plan that is just right for you. Document Released: 02/02/2005 Document Revised: 05/13/2013 Document Reviewed: 04/04/2013 [employer] Patient Information 2015 [employer]. This   information is not intended to replace advice given to you by your health care provider. Make sure you discuss any questions you have with your health care provider.  

## 2015-02-12 LAB — MICROALBUMIN / CREATININE URINE RATIO
CREATININE, URINE: 88.7 mg/dL
MICROALB UR: 2.3 mg/dL — AB (ref <2.0)
MICROALB/CREAT RATIO: 25.9 mg/g (ref 0.0–30.0)

## 2015-02-12 LAB — COMPREHENSIVE METABOLIC PANEL
ALT: 27 U/L (ref 9–46)
AST: 14 U/L (ref 10–35)
Albumin: 3.8 g/dL (ref 3.6–5.1)
Alkaline Phosphatase: 221 U/L — ABNORMAL HIGH (ref 40–115)
BILIRUBIN TOTAL: 0.4 mg/dL (ref 0.2–1.2)
BUN: 26 mg/dL — AB (ref 7–25)
CALCIUM: 10 mg/dL (ref 8.6–10.3)
CO2: 26 mmol/L (ref 20–31)
Chloride: 104 mmol/L (ref 98–110)
Creat: 1.66 mg/dL — ABNORMAL HIGH (ref 0.70–1.33)
GLUCOSE: 156 mg/dL — AB (ref 65–99)
Potassium: 5.3 mmol/L (ref 3.5–5.3)
SODIUM: 140 mmol/L (ref 135–146)
Total Protein: 7.3 g/dL (ref 6.1–8.1)

## 2015-02-15 MED ORDER — METFORMIN HCL ER 500 MG PO TB24: 500.0000 mg | ORAL_TABLET | Freq: Every day | ORAL | Status: DC

## 2015-02-15 NOTE — Progress Notes (Signed)
Patient ID: Marc [name], male   DOB: 1960-06-26, 54 y.o.   MRN: [id number]   Facility: [facility]      No Known Allergies  Chief Complaint  Patient presents with  . Medical Management of Chronic Issues    HPI:  He is a resident of this facility being seen for the management of his chronic illnesses.  His status remains stable; his cbg's are improving. His blood pressure remains slightly elevated. He is not voicing any concerns or complaints at this time.    Past Medical History  Diagnosis Date  . Diabetes mellitus without complication   . Hypertension   . Endocarditis   . Acute osteomyelitis of femur 01/19/2015    Past Surgical History  Procedure Laterality Date  . No past surgeries    . Video bronchoscopy Bilateral 12/03/2014    Procedure: VIDEO BRONCHOSCOPY WITHOUT FLUORO;  Surgeon: Chesley Mires, MD;  Location: [facility];  Service: Cardiopulmonary;  Laterality: Bilateral;  . Total hip revision Left 12/14/2014    Procedure: Left hip resection with prostalac ;  Surgeon: Paralee Cancel, MD;  Location: [facility];  Service: Orthopedics;  Laterality: Left;    VITAL SIGNS BP 142/86 mmHg  Pulse 82  Ht 5' 10"  (1.778 m)  Wt 168 lb (76.204 kg)  BMI 24.11 kg/m2  Patient's Medications  New Prescriptions   No medications on file  Previous Medications   ACETAMINOPHEN (TYLENOL) 325 MG TABLET    Take 2 tablets (650 mg total) by mouth every 6 (six) hours as needed for mild pain (or Fever >/= 101).   ALUM & MAG HYDROXIDE-SIMETH (MAALOX/MYLANTA) 200-200-20 MG/5ML SUSPENSION    Take 30 mLs by mouth every 6 (six) hours as needed for indigestion or heartburn.   AMLODIPINE (NORVASC) 10 MG TABLET    Take 1 tablet (10 mg total) by mouth daily.   BISACODYL (DULCOLAX) 10 MG SUPPOSITORY    Place 1 suppository (10 mg total) rectally daily as needed for moderate constipation.   CLOTRIMAZOLE (CLOTRIMAZOLE AF) 1 % CREAM    Apply 1 application topically 2 (two) times daily.   DOCUSATE SODIUM (COLACE) 100 MG CAPSULE    Take 1 capsule (100 mg total) by mouth 2 (two) times daily.   DOXYCYCLINE (VIBRA-TABS) 100 MG TABLET    Take 1 tablet (100 mg total) by mouth 2 (two) times daily.   FERROUS SULFATE 325 (65 FE) MG TABLET    Take 1 tablet (325 mg total) by mouth 3 (three) times daily after meals.   FOLIC ACID (FOLVITE) 1 MG TABLET    Take 1 tablet (1 mg total) by mouth daily.   IBUPROFEN (ADVIL,MOTRIN) 600 MG TABLET    Take 1 tablet (600 mg total) by mouth every 6 (six) hours as needed.   INSULIN ASPART (NOVOLOG) 100 UNIT/ML INJECTION    Inject 0-9 Units into the skin 3 (three) times daily with meals. CBG < 70: implement hypoglycemia protocol CBG 70 - 120: 0 units CBG 121 - 150: 1 unit CBG 151 - 200: 2 units CBG 201 - 250: 3 units CBG 251 - 300: 5 units CBG 301 - 350: 7 units CBG 351 - 400: 9 units CBG > 400: call MD.   INSULIN GLARGINE (LANTUS) 100 UNIT/ML INJECTION    Inject 0.15 mLs (15 Units total) into the skin at bedtime.   LISINOPRIL (PRINIVIL,ZESTRIL) 5 MG TABLET    Take 1 tablet (5 mg total) by mouth daily.   MULTIPLE VITAMIN (MULTIVITAMIN WITH  MINERALS) TABS TABLET    Take 1 tablet by mouth daily.   OXYCODONE (OXY IR/ROXICODONE) 5 MG IMMEDIATE RELEASE TABLET    Take one to two tablets by mouth every 4 hours as needed for pain   PANTOPRAZOLE (PROTONIX) 40 MG TABLET    Take 1 tablet (40 mg total) by mouth daily.   POLYETHYLENE GLYCOL (MIRALAX / GLYCOLAX) PACKET    Take 17 g by mouth 2 (two) times daily.   RIFAMPIN (RIFADIN) 300 MG CAPSULE    Take 1 capsule (300 mg total) by mouth daily. Complete on January 26, 2015.   THIAMINE 100 MG TABLET    Take 1 tablet (100 mg total) by mouth daily.   VANCOMYCIN 1,250 MG IN SODIUM CHLORIDE 0.9 % 250 ML    Inject 1,250 mg into the vein every 12 (twelve) hours. Complete on January 26, 2015.  Modified Medications   No medications on file  Discontinued Medications   No medications on file     SIGNIFICANT DIAGNOSTIC  EXAMS  LABS REVIEWED:   12-24-14: wbc 8.2; hgb 8.7; hct 27.5; mcv 78.9; plt 398; glucose 152; bun 13.5; creat 0.87; k+ 4.6; na++140; alk phos 184; alt 12; ast 11; albumin 3.1; globulin 3.8  12-24-14: wbc 10.0; hgb 8.3; hct 26.7; mcv 78.7; plt 489; glucose 115; bun 13.6; creat 0.88; k+ 4.4; na++135; alk phos 198; ast 19; ast 11; albumin 3.1; globulin 4.1     Review of Systems  Constitutional: Negative for appetite change and fatigue.  HENT: Negative for congestion.   Respiratory: Negative for cough, chest tightness and shortness of breath.   Cardiovascular: Negative for chest pain, palpitations and leg swelling.  Gastrointestinal: Negative for nausea, abdominal pain, diarrhea and constipation.  Musculoskeletal: Negative for myalgias and arthralgias.  Skin: Negative for pallor.  Neurological: Negative for dizziness.  Psychiatric/Behavioral: The patient is not nervous/anxious.       Physical Exam  Constitutional: He is oriented to person, place, and time. No distress.  Eyes: Conjunctivae are normal.  Neck: Neck supple. No JVD present. No thyromegaly present.  Cardiovascular: Normal rate, regular rhythm and intact distal pulses.   Respiratory: Effort normal and breath sounds normal. No respiratory distress. He has no wheezes.  GI: Soft. Bowel sounds are normal. He exhibits no distension. There is no tenderness.  Musculoskeletal: He exhibits no edema.  Able to move all extremities   Lymphadenopathy:    He has no cervical adenopathy.  Neurological: He is alert and oriented to person, place, and time.  Skin: Skin is warm and dry. He is not diaphoretic.  Psychiatric: He has a normal mood and affect.       ASSESSMENT/ PLAN:  1. Diabetes: will begin metfromin er 500 gm daily will continue novolog ssi; and lantus 15 units nightly   2. Hypertension: will increase lisinopril to 10 mg daily; will continue norvasc 10 mg daily asa 325 mg daily   3. Septic arthritis: will continue rifampin  300 mg one time daily and vancomycin per pharmacy dosing;has oxycodone 5 or 10 mg every 4 hours as needed  will monitor  4. Anemia: will continue iron three times daily   5. gerd  Will continue protonix 40 mg daily   6. Constipation: will continue miralax daily and colace twice daily     Ok Edwards NP [facility]  Contact [phone] Monday through Friday 8am- 5pm  After hours call [phone]

## 2015-02-16 ENCOUNTER — Ambulatory Visit (INDEPENDENT_AMBULATORY_CARE_PROVIDER_SITE_OTHER): Payer: Self-pay | Admitting: Infectious Disease

## 2015-02-16 ENCOUNTER — Encounter: Payer: Self-pay | Admitting: Infectious Disease

## 2015-02-16 VITALS — BP 149/91 | HR 118 | Temp 98.0°F | Ht 70.0 in | Wt 159.0 lb

## 2015-02-16 DIAGNOSIS — N178 Other acute kidney failure: Secondary | ICD-10-CM

## 2015-02-16 DIAGNOSIS — IMO0002 Reserved for concepts with insufficient information to code with codable children: Secondary | ICD-10-CM

## 2015-02-16 DIAGNOSIS — I2690 Septic pulmonary embolism without acute cor pulmonale: Secondary | ICD-10-CM

## 2015-02-16 DIAGNOSIS — B9562 Methicillin resistant Staphylococcus aureus infection as the cause of diseases classified elsewhere: Secondary | ICD-10-CM

## 2015-02-16 DIAGNOSIS — M86152 Other acute osteomyelitis, left femur: Secondary | ICD-10-CM

## 2015-02-16 DIAGNOSIS — R7881 Bacteremia: Secondary | ICD-10-CM

## 2015-02-16 DIAGNOSIS — M009 Pyogenic arthritis, unspecified: Secondary | ICD-10-CM

## 2015-02-16 DIAGNOSIS — I76 Septic arterial embolism: Secondary | ICD-10-CM

## 2015-02-16 DIAGNOSIS — E1165 Type 2 diabetes mellitus with hyperglycemia: Secondary | ICD-10-CM

## 2015-02-16 LAB — BASIC METABOLIC PANEL WITH GFR
BUN: 31 mg/dL — AB (ref 7–25)
CALCIUM: 10 mg/dL (ref 8.6–10.3)
CO2: 25 mmol/L (ref 20–31)
CREATININE: 1.76 mg/dL — AB (ref 0.70–1.33)
Chloride: 103 mmol/L (ref 98–110)
GFR, EST AFRICAN AMERICAN: 50 mL/min — AB (ref >=60)
GFR, Est Non African American: 43 mL/min — ABNORMAL LOW (ref >=60)
GLUCOSE: 178 mg/dL — AB (ref 65–99)
Potassium: 4.8 mmol/L (ref 3.5–5.3)
Sodium: 140 mmol/L (ref 135–146)

## 2015-02-16 LAB — C-REACTIVE PROTEIN: CRP: 0.8 mg/dL — ABNORMAL HIGH (ref <0.60)

## 2015-02-16 NOTE — Progress Notes (Signed)
Chief complaint: Hip pain Subjective:    Patient ID: Marc [name], male    DOB: 1960/09/18, 54 y.o.   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 He underwent surgery on July 26th by Dr. Charlann Boxer with:  Left hip resection arthroplasty, placement of temporary antibiotic cemented total hip arthroplasty from [facility]  Patient's head of the femur was unhealthy appearing and Dr Charlann Boxer resected it and sent for pathology though I cannot find the path report.   He has been on IV vancomycin and I believe oral rifampin since then.  He had felt much better and I last saw him No cough, no chest pain, no palpitations. His left hip had some postoperative swelling that is better now and his pain is much improved now largely with weight bearing  PICC line had been also replaced. I tried to extend his anti-biotics to 8 weeks postoperatively but he was discharged in the skilled nursing was solely with no plans for home IV antibody and therefore his PICC line was discontinued. He was then placed on oral doxycycline which he is continued on to this day now past 8 weeks postoperatively. Hip pain is largely improved is without fevers chills or malaise and his operative site appears to be healing well.  Past Medical History  Diagnosis Date  . Diabetes mellitus without complication   . Hypertension   . Endocarditis   . Acute osteomyelitis of femur 01/19/2015    Past Surgical History  Procedure Laterality Date  . No past surgeries    . Video bronchoscopy  Bilateral 12/03/2014    Procedure: VIDEO BRONCHOSCOPY WITHOUT FLUORO;  Surgeon: Coralyn Helling, MD;  Location: [facility];  Service: Cardiopulmonary;  Laterality: Bilateral;  . Total hip revision Left 12/14/2014    Procedure: Left hip resection with prostalac ;  Surgeon: Durene Romans, MD;  Location: [facility];  Service: Orthopedics;  Laterality: Left;    Family History  Problem Relation Age of Onset  . Hypertension Mother   . Diabetes Sister   . Diabetes Brother       Social History   Social History  . Marital Status: Single    Spouse Name: N/A  . Number of Children: N/A  . Years of Education: N/A   Social History Main Topics  . Smoking status: Never Smoker   . Smokeless tobacco: Never Used  . Alcohol Use: No     Comment: patient states he does not drink on 12/08/14  . Drug Use: No  . Sexual Activity: Not Asked   Other Topics Concern  . None   Social History Narrative    No Known Allergies   Current outpatient prescriptions:  .  acetaminophen (TYLENOL) 325 MG tablet, Take 2 tablets (650 mg total) by mouth every 6 (six) hours as needed for mild pain (or Fever >/= 101)., Disp: , Rfl:  .  doxycycline (VIBRA-TABS) 100 MG tablet, Take 1 tablet (100 mg total) by mouth 2 (two) times daily., Disp: 60 tablet, Rfl: 0 .  ibuprofen (ADVIL,MOTRIN) 600 MG  tablet, Take 1 tablet (600 mg total) by mouth every 6 (six) hours as needed., Disp: 30 tablet, Rfl: 0 .  amLODipine (NORVASC) 10 MG tablet, Take 1 tablet (10 mg total) by mouth daily. (Patient not taking: Reported on 02/16/2015), Disp: 30 tablet, Rfl: 2 .  clotrimazole (CLOTRIMAZOLE AF) 1 % cream, Apply 1 application topically 2 (two) times daily. (Patient not taking: Reported on 02/16/2015), Disp: 30 g, Rfl: 1 .  ferrous sulfate 325 (65 FE) MG tablet, Take 1 tablet (325 mg total) by mouth 3 (three) times daily after meals. (Patient not taking: Reported on 02/16/2015), Disp: , Rfl: 3 .  folic acid (FOLVITE) 1 MG tablet, Take 1 tablet (1 mg  total) by mouth daily. (Patient not taking: Reported on 02/16/2015), Disp: 30 tablet, Rfl: 0 .  insulin aspart (NOVOLOG) 100 UNIT/ML injection, Inject 0-9 Units into the skin 3 (three) times daily with meals. CBG < 70: implement hypoglycemia protocol CBG 70 - 120: 0 units CBG 121 - 150: 1 unit CBG 151 - 200: 2 units CBG 201 - 250: 3 units CBG 251 - 300: 5 units CBG 301 - 350: 7 units CBG 351 - 400: 9 units CBG > 400: call MD., Disp: 10 mL, Rfl: 3 .  insulin glargine (LANTUS) 100 UNIT/ML injection, Inject 0.15 mLs (15 Units total) into the skin at bedtime., Disp: 10 mL, Rfl: 3 .  lisinopril (PRINIVIL,ZESTRIL) 5 MG tablet, Take 1 tablet (5 mg total) by mouth daily. (Patient not taking: Reported on 02/16/2015), Disp: 30 tablet, Rfl: 3 .  metFORMIN (GLUCOPHAGE XR) 500 MG 24 hr tablet, Take 1 tablet (500 mg total) by mouth daily with breakfast., Disp: 30 tablet, Rfl: 0 .  Multiple Vitamin (MULTIVITAMIN WITH MINERALS) TABS tablet, Take 1 tablet by mouth daily., Disp: 30 tablet, Rfl: 0 .  oxyCODONE (OXY IR/ROXICODONE) 5 MG immediate release tablet, Take one to two tablets by mouth every 4 hours as needed for pain, Disp: 360 tablet, Rfl: 0 .  pantoprazole (PROTONIX) 40 MG tablet, Take 1 tablet (40 mg total) by mouth daily. (Patient not taking: Reported on 02/16/2015), Disp: 30 tablet, Rfl: 3 .  rifampin (RIFADIN) 300 MG capsule, Take 1 capsule (300 mg total) by mouth daily. Complete on January 26, 2015., Disp: , Rfl:  .  thiamine 100 MG tablet, Take 1 tablet (100 mg total) by mouth daily., Disp: 30 tablet, Rfl: 0     Review of Systems  Constitutional: Negative for fever, chills, diaphoresis, activity change, appetite change, fatigue and unexpected weight change.  HENT: Negative for congestion, rhinorrhea, sinus pressure, sneezing, sore throat and trouble swallowing.   Eyes: Negative for photophobia and visual disturbance.  Respiratory: Negative for cough, chest tightness, shortness of breath, wheezing and  stridor.   Cardiovascular: Negative for chest pain, palpitations and leg swelling.  Gastrointestinal: Negative for nausea, vomiting, abdominal pain, diarrhea, constipation, blood in stool, abdominal distention and anal bleeding.  Genitourinary: Negative for dysuria, hematuria, flank pain and difficulty urinating.  Musculoskeletal: Positive for gait problem. Negative for myalgias, back pain, joint swelling and arthralgias.  Skin: Positive for wound. Negative for color change, pallor and rash.  Neurological: Negative for dizziness, tremors, weakness and light-headedness.  Hematological: Negative for adenopathy. Does not bruise/bleed easily.  Psychiatric/Behavioral: Negative for behavioral problems, confusion, sleep disturbance, dysphoric mood, decreased concentration and agitation.       Objective:   Physical Exam  Constitutional: He is oriented to person, place, and time. He appears well-developed and well-nourished.  HENT:  Head: Normocephalic and atraumatic.  Eyes: Conjunctivae and EOM are normal.  Neck: Normal range of motion. Neck supple.  Cardiovascular: Normal rate, regular rhythm and intact distal pulses.   Murmur heard. Pulmonary/Chest: Effort normal and breath sounds normal. No respiratory distress. He has no wheezes. He has no rales. He exhibits no tenderness.  Abdominal: Soft. Bowel sounds are normal. He exhibits no distension. There is no tenderness. There is no rebound.  Musculoskeletal: Normal range of motion. He exhibits no edema.  Neurological: He is alert and oriented to person, place, and time.  Skin: Skin is warm and dry. No erythema.  Psychiatric: He has a normal mood and affect. His behavior is normal. Thought content normal.    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  AKI: Most recent serum creatinine was elevated 1.66 not clear what the cause of this was but it could be vancomycin toxicity will recheck a basic metabolic panel today.  Diabetic feet: no overt infection: he does have callus. Getting insulin  ? AVN: per Dr Charlann Boxer from OR. No path sent? : he had prior drinking hx> I suspect that this is simply due to terrible MRSA infection  I spent greater than 25 minutes with the patient including greater than 50% of time in face to face counsel of the patient re his MRSA bacteremia, endocarditis, septic hip, osteo of the femur and his diabetes, and ? AVN and in coordination of their care.

## 2015-02-17 ENCOUNTER — Encounter: Payer: Self-pay | Admitting: Family Medicine

## 2015-02-17 ENCOUNTER — Ambulatory Visit (HOSPITAL_BASED_OUTPATIENT_CLINIC_OR_DEPARTMENT_OTHER): Payer: Self-pay | Admitting: Pharmacist

## 2015-02-17 ENCOUNTER — Ambulatory Visit: Payer: Medicaid Other | Attending: Family Medicine | Admitting: Family Medicine

## 2015-02-17 VITALS — BP 167/102 | HR 85 | Temp 98.4°F | Ht 70.0 in | Wt 166.0 lb

## 2015-02-17 DIAGNOSIS — Z794 Long term (current) use of insulin: Secondary | ICD-10-CM | POA: Diagnosis not present

## 2015-02-17 DIAGNOSIS — Z9889 Other specified postprocedural states: Secondary | ICD-10-CM

## 2015-02-17 DIAGNOSIS — N179 Acute kidney failure, unspecified: Secondary | ICD-10-CM | POA: Insufficient documentation

## 2015-02-17 DIAGNOSIS — E1165 Type 2 diabetes mellitus with hyperglycemia: Secondary | ICD-10-CM | POA: Insufficient documentation

## 2015-02-17 DIAGNOSIS — M86152 Other acute osteomyelitis, left femur: Secondary | ICD-10-CM

## 2015-02-17 DIAGNOSIS — Z79899 Other long term (current) drug therapy: Secondary | ICD-10-CM | POA: Diagnosis not present

## 2015-02-17 DIAGNOSIS — D509 Iron deficiency anemia, unspecified: Secondary | ICD-10-CM | POA: Insufficient documentation

## 2015-02-17 DIAGNOSIS — M86159 Other acute osteomyelitis, unspecified femur: Secondary | ICD-10-CM | POA: Diagnosis not present

## 2015-02-17 DIAGNOSIS — I10 Essential (primary) hypertension: Secondary | ICD-10-CM | POA: Diagnosis not present

## 2015-02-17 DIAGNOSIS — N178 Other acute kidney failure: Secondary | ICD-10-CM

## 2015-02-17 DIAGNOSIS — Z96642 Presence of left artificial hip joint: Secondary | ICD-10-CM

## 2015-02-17 DIAGNOSIS — Z9114 Patient's other noncompliance with medication regimen: Secondary | ICD-10-CM | POA: Insufficient documentation

## 2015-02-17 DIAGNOSIS — D500 Iron deficiency anemia secondary to blood loss (chronic): Secondary | ICD-10-CM

## 2015-02-17 LAB — GLUCOSE, POCT (MANUAL RESULT ENTRY): POC Glucose: 206 mg/dl — AB (ref 70–99)

## 2015-02-17 LAB — SEDIMENTATION RATE: Sed Rate: 14 mm/hr (ref 0–20)

## 2015-02-17 LAB — POCT GLYCOSYLATED HEMOGLOBIN (HGB A1C): Hemoglobin A1C: 7.4

## 2015-02-17 MED ORDER — PANTOPRAZOLE SODIUM 40 MG PO TBEC: 40.0000 mg | DELAYED_RELEASE_TABLET | Freq: Every day | ORAL | Status: DC

## 2015-02-17 MED ORDER — TRUEPLUS LANCETS 28G MISC: Status: DC

## 2015-02-17 MED ORDER — GLUCOSE BLOOD VI STRP: ORAL_STRIP | Status: DC

## 2015-02-17 MED ORDER — FERROUS SULFATE 325 (65 FE) MG PO TABS: 325.0000 mg | ORAL_TABLET | Freq: Three times a day (TID) | ORAL | Status: AC

## 2015-02-17 MED ORDER — TRUE METRIX METER W/DEVICE KIT: PACK | Status: DC

## 2015-02-17 MED ORDER — GLIPIZIDE 10 MG PO TABS: 10.0000 mg | ORAL_TABLET | Freq: Two times a day (BID) | ORAL | Status: DC

## 2015-02-17 MED ORDER — NIFEDIPINE ER 90 MG PO TB24: 90.0000 mg | ORAL_TABLET | Freq: Every day | ORAL | Status: DC

## 2015-02-17 NOTE — Progress Notes (Signed)
I let the patient know, he was very pleased

## 2015-02-17 NOTE — Progress Notes (Signed)
S:    Patient arrives for a visit with Dr. Venetia Night. I was asked to provide diabetes education.   Patient denies adherence with insulin because he didn't understand how to take it and he really didn't want to inject himself.  He is also noncompliant with glucose meter use - he doesn't have the lancing device and denies knowing how to use it.   Patient denies hypoglycemic events.  O:  Lab Results  Component Value Date   HGBA1C 7.40 02/17/2015    A/P: Diabetes currently under improved control based on A1c of 7.4.  Patient in need of diabetic education for meter use and lifestyle changes.  Patient denies hypoglycemic events and is able to verbalize appropriate hypoglycemia management plan.  Patient denies adherence with medication due to not understanding how to take them. Control is suboptimal due to sedentary lifestyle and dietary indiscretion, as well as medication noncompliance.  Provided extensive counseling on diabetes, blood glucose meter use, and insulin use in case patient starts it. Also provided dietary counseling including meal planning and the plate method. Provided handouts on all education for patient to take home. Patient was very appreciative of education and was able to verbalize and demonstrate the information learned during the visit.   Ordered patient a new meter, lancets, and strips.   Next A1C anticipated December 2016.    Written patient instructions provided. Total time in face to face counseling 35 minutes.   Follow up in Pharmacist Clinic Visit as needed.   Patient seen with Sherle Poe, PharmD Resident.

## 2015-02-17 NOTE — Progress Notes (Signed)
Patient reports no pain, depression, smoking, drugs, ETOH He is taking his doxycycline He states he has not been taking his insulin because he does not know how to administer Made appointment for [facility] PharmD to see him after his appointment to educate on this

## 2015-02-17 NOTE — Progress Notes (Signed)
Subjective:    Patient ID: Marc [name], male    DOB: 09-13-1960, 54 y.o.   MRN: [id number]  HPI 54 year old male with a history of uncontrolled type 2 diabetes mellitus, hypertension, noncompliance with medication therapy, right sided endocarditis, MRSA bacteremia with septic emboli to the lung (diagnosis of endocarditis was clinical) in 11/2014 who was never compliant with his IV antibiotic regimen, kept leaving the hospital AMA and subsequently developed a left septic hip joint with osteomyelitis of the femur and status post left hip resection arthroplasty, placement of temporary antibiotic cemented total hip arthroplasty from [facility] on 12/15/14, discharge from [facility] 2 weeks ago where he received IV antibiotics. Medical records indicate he self discharged but the patient insisted he was properly discharged.  He was last seen by infectious disease yesterday and the plan was to check a CRP and sedimentation rate and discontinue doxycycline in the event that these were normal. His CRP is  Mildly elevated at 0.8 and sedimentation rate is normal.  Of note he has had the slight increase in his creatinine from the baseline of 0.7 to 1.66 a week ago and now 1.76.  He expresses his desire to abstain from insulin and has not been taking his Lantus or his NovoLog and has also run out of his metformin. Review of his records from [facility] indicates she was on Lantus and NovoLog as well as metformin. He has no pain in his hip and has an upcoming appointment with orthopedics in 2 days.  Past Medical History  Diagnosis Date  . Diabetes mellitus without complication   . Hypertension   . Endocarditis   . Acute osteomyelitis of femur 01/19/2015   Past Surgical History  Procedure Laterality Date  . No past surgeries    . Video bronchoscopy Bilateral 12/03/2014    Procedure: VIDEO BRONCHOSCOPY WITHOUT FLUORO;  Surgeon: Coralyn Helling, MD;  Location: [facility];  Service: Cardiopulmonary;   Laterality: Bilateral;  . Total hip revision Left 12/14/2014    Procedure: Left hip resection with prostalac ;  Surgeon: Durene Romans, MD;  Location: [facility];  Service: Orthopedics;  Laterality: Left;    Social History   Social History  . Marital Status: Single    Spouse Name: N/A  . Number of Children: N/A  . Years of Education: N/A   Occupational History  . Not on file.   Social History Main Topics  . Smoking status: Never Smoker   . Smokeless tobacco: Never Used  . Alcohol Use: No     Comment: patient states he does not drink on 12/08/14  . Drug Use: No  . Sexual Activity: Not on file   Other Topics Concern  . Not on file   Social History Narrative    No Known Allergies  Current Outpatient Prescriptions on File Prior to Visit  Medication Sig Dispense Refill  . clotrimazole (CLOTRIMAZOLE AF) 1 % cream Apply 1 application topically 2 (two) times daily. 30 g 1  . lisinopril (PRINIVIL,ZESTRIL) 5 MG tablet Take 1 tablet (5 mg total) by mouth daily. 30 tablet 3  . insulin glargine (LANTUS) 100 UNIT/ML injection Inject 0.15 mLs (15 Units total) into the skin at bedtime. (Patient not taking: Reported on 02/17/2015) 10 mL 3  . Multiple Vitamin (MULTIVITAMIN WITH MINERALS) TABS tablet Take 1 tablet by mouth daily. (Patient not taking: Reported on 02/17/2015) 30 tablet 0  . rifampin (RIFADIN) 300 MG capsule Take 1 capsule (300 mg total) by mouth daily. Complete  on January 26, 2015. (Patient not taking: Reported on 02/17/2015)     No current facility-administered medications on file prior to visit.      Review of Systems Review of Systems  Constitutional: Negative for activity change and appetite change.  HENT: Negative for sinus pressure and sore throat.   Eyes: Negative for visual disturbance.  Respiratory: Negative for cough, chest tightness and shortness of breath.   Cardiovascular: Negative for chest pain and leg swelling.  Gastrointestinal: Negative for abdominal pain,  diarrhea, constipation and abdominal distention.  Endocrine: Negative.   Genitourinary: Negative for dysuria.  Musculoskeletal: Negative for myalgias and joint swelling or pain  Skin: Negative for rash.  Allergic/Immunologic: Negative.   Neurological: Negative for weakness, light-headedness and numbness.  Psychiatric/Behavioral: Negative for suicidal ideas and dysphoric mood.      Objective:  Filed Vitals:   02/17/15 1151  BP: 167/102  Pulse: 85  Temp: 98.4 F (36.9 C)  Height:  (1.778 m)  Weight: 166 lb (75.297 kg)  SpO2: 99%       Physical Exam Constitutional: He is oriented to person, place, and time. He appears well-developed and well-nourished.  HENT:  Head: Normocephalic and atraumatic.  Right Ear: External ear normal.  Left Ear: External ear normal.  Neck: Normal range of motion. Neck supple. No tracheal deviation present.  Cardiovascular: Regular rhythm and normal heart sounds.  Tachycardia present.   No murmur heard. Pulmonary/Chest: Effort normal and breath sounds normal. No respiratory distress. He has no wheezes. He exhibits no tenderness.  Abdominal: Soft. Bowel sounds are normal. He exhibits no mass. There is no tenderness.  Musculoskeletal: He exhibits mild tenderness (on range of motion of the left hip; mildly decreased range of motion of the left hip.). He exhibits no edema.  Neurological: He is alert and oriented to person, place, and time.  Skin: Skin is warm and dry.   Psychiatric: He has a normal mood and affect.     CMP Latest Ref Rng 02/16/2015 02/11/2015 12/18/2014  Glucose 65 - 99 mg/dL 161(W) 960(A) -  BUN 7 - 25 mg/dL 54(U) 98(J) -  Creatinine 0.70 - 1.33 mg/dL 1.91(Y) 7.82(N) -  Sodium 135 - 146 mmol/L 140 140 -  Potassium 3.5 - 5.3 mmol/L 4.8 5.3 -  Chloride 98 - 110 mmol/L 103 104 -  CO2 20 - 31 mmol/L 25 26 -  Calcium 8.6 - 10.3 mg/dL 56.2 13.0 -  Total Protein 6.1 - 8.1 g/dL - 7.3 7.0  Total Bilirubin 0.2 - 1.2 mg/dL - 0.4 0.4    Alkaline Phos 40 - 115 U/L - 221(H) 157(H)  AST 10 - 35 U/L - 14 21  ALT 9 - 46 U/L - 27 35     CBC Latest Ref Rng 02/11/2015 12/17/2014 12/16/2014  WBC 4.0 - 10.5 K/uL 13.1(H) 11.8(H) 15.4(H)  Hemoglobin 13.0 - 17.0 g/dL 10.4(L) 8.1(L) 8.0(L)  Hematocrit 39.0 - 52.0 % 33.1(L) 25.5(L) 25.3(L)  Platelets 150 - 400 K/uL 438(H) 402(H) 343        Assessment & Plan:  1. Type 2 diabetes mellitus with hyperglycemia A1c has trended down significantly in the last 3 months from 12.1 to 7.4 Since he has expressed strong concern about taking insulin I am willing to work with him and have explained we could try glipizide and review his blood sugar logs for improvement; in the event that he is still hyperglycemic we might have to put his Lantus back on board. Discontinue metformin due to  acute kidney injury Clinical pharmacist called into the clinic for diabetic education, instruction on how to use his Glucometer  2. Acute osteomyelitis Saw infectious disease yesterday and as per ID notes plan was to discontinue doxycycline in the event that the sedimentation rate and CRP came back normal and so I have discontinued doxycycline.  3. Essential hypertension Uncontrolled  Amlodipine replaced with Nifedipine Continue Lisinopril Will monitor potassium level  4. Status post left hip arthroplasty He denies any pain at this time. Has an upcoming appointment with orthopedics in 3 days.  5. Iron deficiency anemia  We will need to workup possible etiologies Last hemoglobin was 10.4 from 02/11/15  - ferrous sulfate 325 (65 FE) MG tablet; Take 1 tablet (325 mg total) by mouth 3 (three) times daily after meals.; Refill: 3  6. Acute kidney injury Could be vancomycin related versus diabetic nephropathy. I have discontinued his NSAIDs and advised him to avoid nephrotoxic agents. We'll monitor renal function

## 2015-02-17 NOTE — Patient Instructions (Addendum)
Thanks for coming to see [place] today!  Pick up your strips and lancets to use with your meter.  Blood Glucose Monitoring Monitoring your blood glucose (also know as blood sugar) helps you to manage your diabetes. It also helps you and your health care provider monitor your diabetes and determine how well your treatment plan is working. WHY SHOULD YOU MONITOR YOUR BLOOD GLUCOSE?  It can help you understand how food, exercise, and medicine affect your blood glucose.  It allows you to know what your blood glucose is at any given moment. You can quickly tell if you are having low blood glucose (hypoglycemia) or high blood glucose (hyperglycemia).  It can help you and your health care provider know how to adjust your medicines.  It can help you understand how to manage an illness or adjust medicine for exercise. WHEN SHOULD YOU TEST? Your health care provider will help you decide how often you should check your blood glucose. This may depend on the type of diabetes you have, your diabetes control, or the types of medicines you are taking. Be sure to write down all of your blood glucose readings so that this information can be reviewed with your health care provider. See below for examples of testing times that your health care provider may suggest. Type 1 Diabetes  Test 4 times a day if you are in good control, using an insulin pump, or perform multiple daily injections.  If your diabetes is not well controlled or if you are sick, you may need to monitor more often.  It is a good idea to also monitor:  Before and after exercise.  Between meals and 2 hours after a meal.  Occasionally between 2:00 a.m. and 3:00 a.m. Type 2 Diabetes  It can vary with each person, but generally, if you are on insulin, test 4 times a day.  If you take medicines by mouth (orally), test 2 times a day.  If you are on a controlled diet, test once a day.  If your diabetes is not well controlled or if you are  sick, you may need to monitor more often. HOW TO MONITOR YOUR BLOOD GLUCOSE Supplies Needed  Blood glucose meter.  Test strips for your meter. Each meter has its own strips. You must use the strips that go with your own meter.  A pricking needle (lancet).  A device that holds the lancet (lancing device).  A journal or log book to write down your results. Procedure  Wash your hands with soap and water. Alcohol is not preferred.  Prick the side of your finger (not the tip) with the lancet.  Gently milk the finger until a small drop of blood appears.  Follow the instructions that come with your meter for inserting the test strip, applying blood to the strip, and using your blood glucose meter. Other Areas to Get Blood for Testing Some meters allow you to use other areas of your body (other than your finger) to test your blood. These areas are called alternative sites. The most common alternative sites are:  The forearm.  The thigh.  The back area of the lower leg.  The palm of the hand. The blood flow in these areas is slower. Therefore, the blood glucose values you get may be delayed, and the numbers are different from what you would get from your fingers. Do not use alternative sites if you think you are having hypoglycemia. Your reading will not be accurate. Always use a finger  if you are having hypoglycemia. Also, if you cannot feel your lows (hypoglycemia unawareness), always use your fingers for your blood glucose checks. ADDITIONAL TIPS FOR GLUCOSE MONITORING  Do not reuse lancets.  Always carry your supplies with you.  All blood glucose meters have a 24-hour "hotline" number to call if you have questions or need help.  Adjust (calibrate) your blood glucose meter with a control solution after finishing a few boxes of strips. BLOOD GLUCOSE RECORD KEEPING It is a good idea to keep a daily record or log of your blood glucose readings. Most glucose meters, if not all, keep  your glucose records stored in the meter. Some meters come with the ability to download your records to your home computer. Keeping a record of your blood glucose readings is especially helpful if you are wanting to look for patterns. Make notes to go along with the blood glucose readings because you might forget what happened at that exact time. Keeping good records helps you and your health care provider to work together to achieve good diabetes management.  Document Released: 05/11/2003 Document Revised: 09/22/2013 Document Reviewed: 09/30/2012 [facility] Patient Information 2015 [place], [place]. This information is not intended to replace advice given to you by your health care provider. Make sure you discuss any questions you have with your health care provider.

## 2015-02-18 ENCOUNTER — Telehealth: Payer: Self-pay | Admitting: *Deleted

## 2015-02-18 NOTE — Telephone Encounter (Signed)
-----   Message from Jaclyn Shaggy, MD sent at 02/15/2015  8:34 AM EDT ----- Renal function shows a decline; advise to stay away from Nephrotoxic agents like NSAIDS. Will repeat at next visit

## 2015-02-18 NOTE — Telephone Encounter (Signed)
Spoke to patient and verified name and date of birth.  Explained his  Renal function shows a decline; advise to stay away from Nephrotoxic agents like NSAIDS. Will repeat at next visit. Patient verbalized understandingg

## 2015-03-04 ENCOUNTER — Ambulatory Visit: Payer: Self-pay | Admitting: Family Medicine

## 2015-03-09 ENCOUNTER — Encounter: Payer: Self-pay | Admitting: Family Medicine

## 2015-03-09 ENCOUNTER — Ambulatory Visit: Payer: Medicaid Other | Attending: Family Medicine | Admitting: Family Medicine

## 2015-03-09 VITALS — BP 148/95 | HR 111 | Temp 98.3°F | Resp 18 | Ht 70.0 in | Wt 178.0 lb

## 2015-03-09 DIAGNOSIS — D509 Iron deficiency anemia, unspecified: Secondary | ICD-10-CM | POA: Insufficient documentation

## 2015-03-09 DIAGNOSIS — Z8249 Family history of ischemic heart disease and other diseases of the circulatory system: Secondary | ICD-10-CM | POA: Diagnosis not present

## 2015-03-09 DIAGNOSIS — Z8739 Personal history of other diseases of the musculoskeletal system and connective tissue: Secondary | ICD-10-CM | POA: Insufficient documentation

## 2015-03-09 DIAGNOSIS — E1165 Type 2 diabetes mellitus with hyperglycemia: Secondary | ICD-10-CM | POA: Diagnosis not present

## 2015-03-09 DIAGNOSIS — Z86711 Personal history of pulmonary embolism: Secondary | ICD-10-CM | POA: Diagnosis not present

## 2015-03-09 DIAGNOSIS — Z79899 Other long term (current) drug therapy: Secondary | ICD-10-CM | POA: Insufficient documentation

## 2015-03-09 DIAGNOSIS — N178 Other acute kidney failure: Secondary | ICD-10-CM

## 2015-03-09 DIAGNOSIS — M86152 Other acute osteomyelitis, left femur: Secondary | ICD-10-CM

## 2015-03-09 DIAGNOSIS — I10 Essential (primary) hypertension: Secondary | ICD-10-CM | POA: Diagnosis not present

## 2015-03-09 DIAGNOSIS — Z9889 Other specified postprocedural states: Secondary | ICD-10-CM

## 2015-03-09 DIAGNOSIS — D500 Iron deficiency anemia secondary to blood loss (chronic): Secondary | ICD-10-CM

## 2015-03-09 DIAGNOSIS — Z794 Long term (current) use of insulin: Secondary | ICD-10-CM | POA: Diagnosis not present

## 2015-03-09 DIAGNOSIS — N179 Acute kidney failure, unspecified: Secondary | ICD-10-CM | POA: Diagnosis not present

## 2015-03-09 DIAGNOSIS — Z96642 Presence of left artificial hip joint: Secondary | ICD-10-CM | POA: Diagnosis not present

## 2015-03-09 DIAGNOSIS — Z7984 Long term (current) use of oral hypoglycemic drugs: Secondary | ICD-10-CM | POA: Diagnosis not present

## 2015-03-09 DIAGNOSIS — Z833 Family history of diabetes mellitus: Secondary | ICD-10-CM | POA: Diagnosis not present

## 2015-03-09 DIAGNOSIS — IMO0002 Reserved for concepts with insufficient information to code with codable children: Secondary | ICD-10-CM

## 2015-03-09 DIAGNOSIS — E118 Type 2 diabetes mellitus with unspecified complications: Secondary | ICD-10-CM

## 2015-03-09 LAB — GLUCOSE, POCT (MANUAL RESULT ENTRY): POC GLUCOSE: 194 mg/dL — AB (ref 70–99)

## 2015-03-09 MED ORDER — TRAMADOL HCL 50 MG PO TABS: 50.0000 mg | ORAL_TABLET | Freq: Three times a day (TID) | ORAL | Status: DC | PRN

## 2015-03-09 NOTE — Progress Notes (Signed)
Pt's here for f/up DM. Pt's presents to clinic spasm in L side of hip that's been going on [id number]. Pt's has temporary hip resection with prostalac procedure and awaiting hip replacement. Pt pain level for hip rated at 8.  Pt reports equilibrium is off when he walks and feels dizzy.   Pt report not taking his BP meds.

## 2015-03-09 NOTE — Progress Notes (Signed)
CC:  HPI: Marc [name] is a 54 y.o. 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 Medical records indicate he self discharged but the patient insisted he was properly discharged.  Interval history: He was seen by orthopedics recently and is being worked up for a left total hip replacement but this will be determined by his appointment with infectious disease which comes up at end of November. At his last office visit I had placed his Lantus on hold as per patient request and commenced glipizide which the patient has been taking once daily rather than twice daily. He was supposed to bring his blood sugar log for review but does not have it with him.  Patient has No headache, No chest pain, No abdominal pain - No Nausea, No new weakness tingling or numbness, No Cough - SOB.  No Known Allergies Past Medical History  Diagnosis Date  . Diabetes mellitus without complication ([place])   . Hypertension   . Endocarditis   . Acute osteomyelitis of femur ([place]) 01/19/2015   Current Outpatient Prescriptions on File Prior to Visit  Medication Sig Dispense Refill  . Blood Glucose Monitoring Suppl (TRUE METRIX METER) W/DEVICE KIT Use as directed 1 kit 0  . clotrimazole (CLOTRIMAZOLE AF) 1 % cream Apply 1 application topically 2 (two) times daily. 30 g 1  . ferrous sulfate 325 (65 FE) MG tablet Take 1 tablet (325 mg  total) by mouth 3 (three) times daily after meals. 90 tablet 3  . glipiZIDE (GLUCOTROL) 10 MG tablet Take 1 tablet (10 mg total) by mouth 2 (two) times daily before a meal. 60 tablet 3  . glucose blood (TRUE METRIX BLOOD GLUCOSE TEST) test strip Use as instructed 100 each 12  . lisinopril (PRINIVIL,ZESTRIL) 5 MG tablet Take 1 tablet (5 mg total) by mouth daily. 30 tablet 3  . Multiple Vitamin (MULTIVITAMIN WITH MINERALS) TABS tablet Take 1 tablet by mouth daily. 30 tablet 0  . NIFEdipine (ADALAT CC) 90 MG 24 hr tablet Take 1 tablet (90 mg total) by mouth daily. 30 tablet 2  . pantoprazole (PROTONIX) 40 MG tablet Take 1 tablet (40 mg total) by mouth daily. 30 tablet 3  . TRUEPLUS LANCETS 28G MISC Use as directed 100 each 12  . insulin glargine (LANTUS) 100 UNIT/ML injection Inject 0.15 mLs (15 Units total) into the skin at bedtime. (Patient not taking: Reported on 02/17/2015) 10 mL 3  . rifampin (RIFADIN) 300 MG capsule Take 1 capsule (300 mg total) by mouth daily. Complete on January 26, 2015. (Patient not taking: Reported on 02/17/2015)     No current facility-administered medications on file prior to visit.   Family History  Problem Relation Age of Onset  . Hypertension Mother   . Diabetes Sister   . Diabetes Brother    Social History   Social History  . Marital Status: Single    Spouse Name: N/A  . Number of Children: N/A  . Years of Education: N/A   Occupational  History  . Not on file.   Social History Main Topics  . Smoking status: Never Smoker   . Smokeless tobacco: Never Used  . Alcohol Use: No     Comment: patient states he does not drink on 12/08/14  . Drug Use: No  . Sexual Activity: Not on file   Other Topics Concern  . Not on file   Social History Narrative    Review of Systems: Constitutional: Negative for activity change and appetite change.  HENT: Negative for sinus pressure and sore throat.   Eyes: Negative for visual disturbance.  Respiratory: Negative  for cough, chest tightness and shortness of breath.   Cardiovascular: Negative for chest pain and leg swelling.  Gastrointestinal: Negative for abdominal pain, diarrhea, constipation and abdominal distention.  Endocrine: Negative.   Genitourinary: Negative for dysuria.  Musculoskeletal: Positive for left hip pain  Skin: Negative for rash.  Allergic/Immunologic: Negative.   Neurological: Negative for weakness, light-headedness and numbness.  Psychiatric/Behavioral: Negative for suicidal ideas and dysphoric mood.    Objective:   Filed Vitals:   03/09/15 1604  BP: 148/95  Pulse: 111  Temp: 98.3 F (36.8 C)  Resp: 18    Physical Exam: Constitutional: He is oriented to person, place, and time. He appears well-developed and well-nourished.  HENT:  Head: Normocephalic and atraumatic.  Right Ear: External ear normal.  Left Ear: External ear normal.  Neck: Normal range of motion. Neck supple. No tracheal deviation present.  Cardiovascular: Regular rhythm and normal heart sounds.  Tachycardia present.   No murmur heard. Pulmonary/Chest: Effort normal and breath sounds normal. No respiratory distress. He has no wheezes. He exhibits no tenderness.  Abdominal: Soft. Bowel sounds are normal. He exhibits no mass. There is no tenderness.  Musculoskeletal: He exhibits mild tenderness (on range of motion of the left hip; mildly decreased range of motion of the left hip.). He exhibits no edema.  Neurological: He is alert and oriented to person, place, and time.  Skin: Skin is warm and dry.   Lab Results  Component Value Date   WBC 13.1* 02/11/2015   HGB 10.4* 02/11/2015   HCT 33.1* 02/11/2015   MCV 75.6* 02/11/2015   PLT 438* 02/11/2015   Lab Results  Component Value Date   CREATININE 1.76* 02/16/2015   BUN 31* 02/16/2015   NA 140 02/16/2015   K 4.8 02/16/2015   CL 103 02/16/2015   CO2 25 02/16/2015    Lab Results  Component Value Date   HGBA1C 7.40 02/17/2015          Assessment and plan:  1. Type 2 diabetes mellitus with hyperglycemia A1c has trended down significantly in the last 3 months from 12.1 to 7.4 Since he has expressed strong concern about taking insulin I am willing to work with him and have explained we could try glipizide and review his blood sugar logs for improvement; in the event that he is still hyperglycemic we might have to put his Lantus back on board. Educated on correct dosing of glipizide and blood sugar log will need to be reviewed at his next visit.  2. Acute osteomyelitis s/p L hip arthroplasty Completed course of antibiotics and will need to see infectious disease for follow-up. Also being worked up for a total left hip replacement  3. Essential hypertension Uncontrolled  He had to take antihypertensives today   4. Iron deficiency anemia  Last hemoglobin was 10.4 from 02/11/15  - ferrous sulfate 325 (65 FE) MG tablet; Take  1 tablet (325 mg total) by mouth 3 (three) times daily after meals.; Refill: 3  5. Acute kidney injury  previously thought to be secondary to vancomycin versus diabetic nephropathy. I have discontinued his NSAIDs and advised him to avoid nephrotoxic agents. We'll monitor renal function      Arnoldo Morale, MD. [facility] [phone] 03/09/2015, 5:06 PM

## 2015-03-10 ENCOUNTER — Telehealth: Payer: Self-pay | Admitting: Family Medicine

## 2015-03-10 LAB — COMPREHENSIVE METABOLIC PANEL
ALT: 16 U/L (ref 9–46)
AST: 12 U/L (ref 10–35)
Albumin: 4.2 g/dL (ref 3.6–5.1)
Alkaline Phosphatase: 145 U/L — ABNORMAL HIGH (ref 40–115)
BUN: 37 mg/dL — AB (ref 7–25)
CALCIUM: 9.8 mg/dL (ref 8.6–10.3)
CO2: 26 mmol/L (ref 20–31)
Chloride: 104 mmol/L (ref 98–110)
Creat: 1.92 mg/dL — ABNORMAL HIGH (ref 0.70–1.33)
GLUCOSE: 186 mg/dL — AB (ref 65–99)
Potassium: 5 mmol/L (ref 3.5–5.3)
Sodium: 140 mmol/L (ref 135–146)
Total Bilirubin: 0.3 mg/dL (ref 0.2–1.2)
Total Protein: 7 g/dL (ref 6.1–8.1)

## 2015-03-10 MED ORDER — TRUEPLUS LANCETS 28G MISC: Status: DC

## 2015-03-10 NOTE — Telephone Encounter (Signed)
Pt. Called stating he came to his OV yesterday and after his visit he went to the pharmacy and he was giving a box of test strips but he was not giving a lancing device. Pt. Needs the lancing device to check his blood sugar. Please f/u with pt.

## 2015-03-11 NOTE — Telephone Encounter (Signed)
-----   Message from Enobong Amao, MD sent at 03/10/2015  2:16 PM EDT ----- Renal function is worsening; please advise him to stay off NSAIDS like Ibuprofen, increase fluid intake. 

## 2015-03-11 NOTE — Telephone Encounter (Signed)
CMA called pt, pt didn't answer. CMA left a message for Patient to return call on OV.

## 2015-03-12 ENCOUNTER — Telehealth: Payer: Self-pay

## 2015-03-12 NOTE — Telephone Encounter (Signed)
CMA called pt, pt didn't answer. Left a message again for pt to call me back.

## 2015-03-12 NOTE — Telephone Encounter (Signed)
-----   Message from Jaclyn ShaggyEnobong Amao, MD sent at 03/10/2015  2:16 PM EDT ----- Renal function is worsening; please advise him to stay off NSAIDS like Ibuprofen, increase fluid intake.

## 2015-03-12 NOTE — Telephone Encounter (Signed)
Pt. Returned call please f/u with pt.

## 2015-03-15 ENCOUNTER — Encounter: Payer: Self-pay | Admitting: Adult Health

## 2015-03-15 NOTE — Progress Notes (Signed)
Patient ID: Marc [name], male   DOB: 18-Jun-1960, 54 y.o.   MRN: [id number]    Facility: [facility]      No Known Allergies  Chief Complaint  Patient presents with  . Discharge Note    HPI:  He is being discharged to home. He will not require for home health services. He will not require any dme. He will need his prescriptions to be written and will need to follow up with his pco.    Past Medical History  Diagnosis Date  . Diabetes mellitus without complication ([place])   . Hypertension   . Endocarditis   . Acute osteomyelitis of femur ([place]) 01/19/2015    Past Surgical History  Procedure Laterality Date  . No past surgeries    . Video bronchoscopy Bilateral 12/03/2014    Procedure: VIDEO BRONCHOSCOPY WITHOUT FLUORO;  Surgeon: Chesley Mires, MD;  Location: [facility] END[facility]COPY;  Service: Cardiopulmonary;  Laterality: Bilateral;  . Total hip revision Left 12/14/2014    Procedure: Left hip resection with prostalac ;  Surgeon: Paralee Cancel, MD;  Location: [facility] ORS;  Service: Orthopedics;  Laterality: Left;    VITAL SIGNS BP 105/61 mmHg  Pulse 92  Ht 5' 10"  (1.778 m)  Wt 164 lb (74.39 kg)  BMI 23.53 kg/m2  SpO2 97%  Patient's Medications  New Prescriptions   No medications on file  Previous Medications   BLOOD GLUCOSE MONITORING SUPPL (TRUE METRIX METER) W/DEVICE KIT    Use as directed   CLOTRIMAZOLE (CLOTRIMAZOLE AF) 1 % CREAM    Apply 1 application topically 2 (two) times daily.   FERROUS SULFATE 325 (65 FE) MG TABLET    Take 1 tablet (325 mg total) by mouth 3 (three) times daily after meals.   GLIPIZIDE (GLUCOTROL) 10 MG TABLET    Take 1 tablet (10 mg total) by mouth 2 (two) times daily before a meal.   GLUCOSE BLOOD (TRUE METRIX BLOOD GLUCOSE TEST) TEST STRIP    Use as instructed   INSULIN GLARGINE (LANTUS) 100 UNIT/ML INJECTION    Inject 0.15 mLs (15 Units total) into the skin at bedtime.   LISINOPRIL (PRINIVIL,ZESTRIL) 5 MG TABLET    Take 1 tablet (5 mg total)  by mouth daily.   MULTIPLE VITAMIN (MULTIVITAMIN WITH MINERALS) TABS TABLET    Take 1 tablet by mouth daily.   NIFEDIPINE (ADALAT CC) 90 MG 24 HR TABLET    Take 1 tablet (90 mg total) by mouth daily.   PANTOPRAZOLE (PROTONIX) 40 MG TABLET    Take 1 tablet (40 mg total) by mouth daily.   RIFAMPIN (RIFADIN) 300 MG CAPSULE    Take 1 capsule (300 mg total) by mouth daily. Complete on January 26, 2015.   TRAMADOL (ULTRAM) 50 MG TABLET    Take 1 tablet (50 mg total) by mouth every 8 (eight) hours as needed.   TRUEPLUS LANCETS 28G MISC    Use as directed  Modified Medications   No medications on file  Discontinued Medications   No medications on file     SIGNIFICANT DIAGNOSTIC EXAMS   LABS REVIEWED:   12-24-14: wbc 8.2; hgb 8.7; hct 27.5; mcv 78.9; plt 398; glucose 152; bun 13.5; creat 0.87; k+ 4.6; na++140; alk phos 184; alt 12; ast 11; albumin 3.1; globulin 3.8  12-24-14: wbc 10.0; hgb 8.3; hct 26.7; mcv 78.7; plt 489; glucose 115; bun 13.6; creat 0.88; k+ 4.4; na++135; alk phos 198; ast 19; ast 11; albumin 3.1; globulin 4.1  Review of Systems Constitutional: Negative for appetite change and fatigue.  HENT: Negative for congestion.   Respiratory: Negative for cough, chest tightness and shortness of breath.   Cardiovascular: Negative for chest pain, palpitations and leg swelling.  Gastrointestinal: Negative for nausea, abdominal pain, diarrhea and constipation.  Musculoskeletal: Negative for myalgias and arthralgias.  Skin: Negative for pallor.  Neurological: Negative for dizziness.  Psychiatric/Behavioral: The patient is not nervous/anxious.        Physical Exam Constitutional: He is oriented to person, place, and time. No distress.  Eyes: Conjunctivae are normal.  Neck: Neck supple. No JVD present. No thyromegaly present.  Cardiovascular: Normal rate, regular rhythm and intact distal pulses.   Respiratory: Effort normal and breath sounds normal. No respiratory distress. He  has no wheezes.  GI: Soft. Bowel sounds are normal. He exhibits no distension. There is no tenderness.  Musculoskeletal: He exhibits no edema.  Able to move all extremities   Lymphadenopathy:    He has no cervical adenopathy.  Neurological: He is alert and oriented to person, place, and time.  Skin: Skin is warm and dry. He is not diaphoretic.  Psychiatric: He has a normal mood and affect.       ASSESSMENT/ PLAN:  Will discharge him to home without home health. He will not need any dme. His prescriptions have been written for a 30 day supply of his medications with #30 oxycodone 5 mg tabs. He has a follow up with Dr. Jarold Song on 02-11-15 at 9:45 am.   Time spent with patient  40  minutes >50% time spent counseling; reviewing medical record; tests; labs; and developing future plan of care    Ok Edwards NP [facility]  Contact [phone] Monday through Friday 8am- 5pm  After hours call [phone]

## 2015-03-15 NOTE — Telephone Encounter (Signed)
CMA called pt, pt verified name and DOB. Pt was given lab results and expressed that he understood his results.

## 2015-03-30 ENCOUNTER — Emergency Department (HOSPITAL_COMMUNITY)
Admission: EM | Admit: 2015-03-30 | Discharge: 2015-03-30 | Discharge status: Against Medical Advice | Payer: Medicaid Other

## 2015-03-30 NOTE — ED Notes (Signed)
Pt called for triage x 3. Checked in bathrooms and outside with no response.

## 2015-03-30 NOTE — ED Notes (Signed)
Called pt to be triaged 2x with no response.  

## 2015-04-06 ENCOUNTER — Telehealth: Payer: Self-pay | Admitting: General Practice

## 2015-04-06 NOTE — Telephone Encounter (Signed)
Patient states that he lost his pain medicine Tramadol. Patient needs a refill on tramadol Please follow up with patient.

## 2015-04-07 ENCOUNTER — Emergency Department (HOSPITAL_COMMUNITY)
Admission: EM | Admit: 2015-04-07 | Discharge: 2015-04-07 | Disposition: A | Discharge status: Home / Self Care | Payer: Medicaid Other

## 2015-04-07 ENCOUNTER — Other Ambulatory Visit: Payer: Self-pay | Admitting: Family Medicine

## 2015-04-07 ENCOUNTER — Other Ambulatory Visit: Payer: Self-pay

## 2015-04-07 NOTE — ED Notes (Signed)
Called multiple times with no answer in lobby

## 2015-04-08 NOTE — Telephone Encounter (Signed)
Patient is calling to check on the status of his tramadol refill. He also mentioned that he misplaced his bp medication and does not remember the name of it. Please follow up with pt.

## 2015-04-09 NOTE — Telephone Encounter (Signed)
Patient left message on nurses' voicemail. Nurse called patient,patinet answered call was disconnected. Nurse called to assess patients needs.  Nurse called patient again, reached voicemail, left message to [facility] [phone]. Nurse will send refill request to provider.

## 2015-04-12 ENCOUNTER — Telehealth: Payer: Self-pay

## 2015-04-12 NOTE — Telephone Encounter (Signed)
Message  received from Ruthy DickKimberly Bennett- Curse, CMA to clarify the patient's request for a BP medication that he is missing.  Call placed to # [phone] (M) and a HIPAA compliant voice mail message was left requesting a call back to # [phone] or [phone].  It was also noted that the patient has an appointment scheduled with Dr Venetia NightAmao tomorrow, 04/13/15 2 1530.  Update provided to K. Janene MadeiraBennett- Curse, CMA.

## 2015-04-12 NOTE — Telephone Encounter (Signed)
Patient called and verified name and DOB. Pt was informed that his request for Tramadol is ready for pickup. Pt is requesting refills on his BP meds as well. Because patient stated that he misplaced his BP meds, I spoke to the [facility] Nurse Erskine Squibb(Jane) [facility] and gave her the information, so she could follow up with patient to find out what's going on.

## 2015-04-12 NOTE — Telephone Encounter (Signed)
CMA called patient, patient did answer. I left a message for him to return my call asap.

## 2015-04-12 NOTE — Telephone Encounter (Signed)
Patient called to request a refill on his pain medication for his leg. Please f/u with pt.

## 2015-04-13 ENCOUNTER — Ambulatory Visit: Payer: Self-pay | Admitting: Family Medicine

## 2015-04-14 ENCOUNTER — Telehealth: Payer: Self-pay

## 2015-04-14 NOTE — Telephone Encounter (Signed)
This Case Manager placed an additional call to patient to clarify patient's request for a BP medication he is missing.  Call placed to #[phone]; unable to reach patient.  HIPPA compliant voicemail left requesting return call.

## 2015-04-19 NOTE — Telephone Encounter (Signed)
Patient returned nurse phone call.

## 2015-04-20 ENCOUNTER — Telehealth: Payer: Self-pay

## 2015-04-20 NOTE — Telephone Encounter (Signed)
Call placed to the patient to check on his status and inquire [facility].  He said that he is doing fine, no concerns reported and no medication requested.   He said that he is " getting ready to go into the hospital soon to get a new hip" and [facility].

## 2015-04-21 ENCOUNTER — Ambulatory Visit (INDEPENDENT_AMBULATORY_CARE_PROVIDER_SITE_OTHER): Payer: Self-pay | Admitting: Infectious Disease

## 2015-04-21 ENCOUNTER — Encounter: Payer: Self-pay | Admitting: Infectious Disease

## 2015-04-21 VITALS — BP 167/88 | HR 105 | Temp 98.6°F | Wt 191.0 lb

## 2015-04-21 DIAGNOSIS — B9562 Methicillin resistant Staphylococcus aureus infection as the cause of diseases classified elsewhere: Secondary | ICD-10-CM

## 2015-04-21 DIAGNOSIS — E1122 Type 2 diabetes mellitus with diabetic chronic kidney disease: Secondary | ICD-10-CM

## 2015-04-21 DIAGNOSIS — Z9889 Other specified postprocedural states: Secondary | ICD-10-CM

## 2015-04-21 DIAGNOSIS — M86152 Other acute osteomyelitis, left femur: Secondary | ICD-10-CM

## 2015-04-21 DIAGNOSIS — M0080 Arthritis due to other bacteria, unspecified joint: Secondary | ICD-10-CM

## 2015-04-21 DIAGNOSIS — E1165 Type 2 diabetes mellitus with hyperglycemia: Secondary | ICD-10-CM

## 2015-04-21 DIAGNOSIS — I2690 Septic pulmonary embolism without acute cor pulmonale: Secondary | ICD-10-CM

## 2015-04-21 DIAGNOSIS — N182 Chronic kidney disease, stage 2 (mild): Secondary | ICD-10-CM

## 2015-04-21 DIAGNOSIS — A498 Other bacterial infections of unspecified site: Secondary | ICD-10-CM

## 2015-04-21 DIAGNOSIS — Z96642 Presence of left artificial hip joint: Secondary | ICD-10-CM

## 2015-04-21 DIAGNOSIS — Z794 Long term (current) use of insulin: Secondary | ICD-10-CM

## 2015-04-21 DIAGNOSIS — R7881 Bacteremia: Secondary | ICD-10-CM

## 2015-04-21 DIAGNOSIS — M01X0 Direct infection of unspecified joint in infectious and parasitic diseases classified elsewhere: Secondary | ICD-10-CM

## 2015-04-21 DIAGNOSIS — IMO0002 Reserved for concepts with insufficient information to code with codable children: Secondary | ICD-10-CM

## 2015-04-21 DIAGNOSIS — I76 Septic arterial embolism: Secondary | ICD-10-CM

## 2015-04-21 LAB — CBC WITH DIFFERENTIAL/PLATELET
BASOS PCT: 1 % (ref 0–1)
Basophils Absolute: 0.1 10*3/uL (ref 0.0–0.1)
EOS PCT: 5 % (ref 0–5)
Eosinophils Absolute: 0.4 10*3/uL (ref 0.0–0.7)
HEMATOCRIT: 36.7 % — AB (ref 39.0–52.0)
Hemoglobin: 12 g/dL — ABNORMAL LOW (ref 13.0–17.0)
Lymphocytes Relative: 23 % (ref 12–46)
Lymphs Abs: 1.7 10*3/uL (ref 0.7–4.0)
MCH: 25.4 pg — ABNORMAL LOW (ref 26.0–34.0)
MCHC: 32.7 g/dL (ref 30.0–36.0)
MCV: 77.6 fL — ABNORMAL LOW (ref 78.0–100.0)
MONO ABS: 0.7 10*3/uL (ref 0.1–1.0)
Monocytes Relative: 9 % (ref 3–12)
Neutro Abs: 4.7 10*3/uL (ref 1.7–7.7)
Neutrophils Relative %: 62 % (ref 43–77)
Platelets: 282 10*3/uL (ref 150–400)
RBC: 4.73 MIL/uL (ref 4.22–5.81)
RDW: 20.9 % — AB (ref 11.5–15.5)
WBC: 7.5 10*3/uL (ref 4.0–10.5)

## 2015-04-21 LAB — BASIC METABOLIC PANEL WITH GFR
BUN: 30 mg/dL — ABNORMAL HIGH (ref 7–25)
CHLORIDE: 106 mmol/L (ref 98–110)
CO2: 24 mmol/L (ref 20–31)
CREATININE: 1.65 mg/dL — AB (ref 0.70–1.33)
Calcium: 10.1 mg/dL (ref 8.6–10.3)
GFR, EST AFRICAN AMERICAN: 54 mL/min — AB (ref >=60)
GFR, Est Non African American: 47 mL/min — ABNORMAL LOW (ref >=60)
Glucose, Bld: 72 mg/dL (ref 65–99)
POTASSIUM: 4.9 mmol/L (ref 3.5–5.3)
SODIUM: 141 mmol/L (ref 135–146)

## 2015-04-21 LAB — C-REACTIVE PROTEIN

## 2015-04-21 MED ORDER — MUPIROCIN 2 % EX OINT: 1.0000 "application " | TOPICAL_OINTMENT | Freq: Two times a day (BID) | CUTANEOUS | Status: DC

## 2015-04-21 MED ORDER — CHLORHEXIDINE GLUCONATE 4 % EX LIQD: Freq: Every day | CUTANEOUS | Status: DC | PRN

## 2015-04-21 NOTE — Progress Notes (Signed)
Chief complaint: Hip pain Subjective:    Patient ID: Marc [name], male    DOB: 19-Nov-1960, 54 y.o.   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 He underwent surgery on July 26th by Dr. Alvan Dame with:  Left hip resection arthroplasty, placement of temporary antibiotic cemented total hip arthroplasty from [facility]  Patient's head of the femur was unhealthy appearing and Dr Alvan Dame resected it and sent for pathology though I cannot find the path report.   He has been on IV vancomycin and I believe oral rifampin since then.  He had felt much better and I last saw him No cough, no chest pain, no palpitations. His left hip had some postoperative swelling that is better now and his pain is much improved now largely with weight bearing  PICC line had been also replaced. I tried to extend his anti-biotics to 8 weeks postoperatively but he was discharged in the skilled nursing was solely with no plans for home IV antibody and therefore his PICC line was discontinued. He was then placed on oral doxycycline which he is continued  past 8 weeks postoperatively. Hip pain is largely improved had improved and he was  without fevers chills or malaise and his operative site appears to be healing well. When his labs for ESR, CRp were normal I took him off of doxycyline. He has continued to have improvement in his pain though a week ago he claims that the surgical site swelled up and he had temporarily more pain. In general pain is with bearing weight and not at rest.     Past Medical History  Diagnosis Date  . Diabetes mellitus without complication ([place])   . Hypertension   . Endocarditis   . Acute osteomyelitis of femur ([place]) 01/19/2015    Past Surgical History  Procedure Laterality Date  . No past surgeries    . Video bronchoscopy Bilateral 12/03/2014    Procedure: VIDEO BRONCHOSCOPY WITHOUT FLUORO;  Surgeon: Chesley Mires, MD;  Location: [facility];  Service: Cardiopulmonary;  Laterality: Bilateral;  . Total hip revision Left 12/14/2014    Procedure: Left hip resection with prostalac ;  Surgeon: Paralee Cancel, MD;  Location: [facility];  Service: Orthopedics;  Laterality: Left;    Family History  Problem Relation Age of Onset  . Hypertension Mother   . Diabetes Sister   . Diabetes Brother       Social History   Social History  . Marital Status: Single    Spouse Name: N/A  . Number of Children: N/A  . Years of Education: N/A   Social History Main Topics  . Smoking status: Never Smoker   . Smokeless tobacco: Never Used  . Alcohol Use: No     Comment: patient states he does not drink on 12/08/14  . Drug Use: No  . Sexual Activity: Not Asked   Other Topics Concern  . None   Social History Narrative    No Known Allergies   Current outpatient prescriptions:  .  amLODipine (NORVASC) 10  MG tablet, , Disp: , Rfl: 2 .  Blood Glucose Monitoring Suppl (TRUE METRIX METER) W/DEVICE KIT, Use as directed, Disp: 1 kit, Rfl: 0 .  clotrimazole (CLOTRIMAZOLE AF) 1 % cream, Apply 1 application topically 2 (two) times daily., Disp: 30 g, Rfl: 1 .  ferrous sulfate 325 (65 FE) MG tablet, Take 1 tablet (325 mg total) by mouth 3 (three) times daily after meals., Disp: 90 tablet, Rfl: 3 .  glipiZIDE (GLUCOTROL) 10 MG tablet, Take 1 tablet (10 mg total) by mouth 2 (two) times daily before a meal., Disp: 60 tablet, Rfl: 3 .  Glucose Blood (TRUE METRIX BLOOD GLUCOSE TEST VI), , Disp: , Rfl: 0 .  glucose blood (TRUE METRIX BLOOD GLUCOSE TEST) test strip, Use  as instructed, Disp: 100 each, Rfl: 12 .  glucose blood test strip, , Disp: , Rfl: 12 .  ibuprofen (ADVIL,MOTRIN) 600 MG tablet, , Disp: , Rfl: 0 .  insulin glargine (LANTUS) 100 UNIT/ML injection, Inject 0.15 mLs (15 Units total) into the skin at bedtime., Disp: 10 mL, Rfl: 3 .  lisinopril (PRINIVIL,ZESTRIL) 5 MG tablet, Take 1 tablet (5 mg total) by mouth daily., Disp: 30 tablet, Rfl: 3 .  Multiple Vitamin (MULTIVITAMIN WITH MINERALS) TABS tablet, Take 1 tablet by mouth daily., Disp: 30 tablet, Rfl: 0 .  NIFEdipine (ADALAT CC) 90 MG 24 hr tablet, Take 1 tablet (90 mg total) by mouth daily., Disp: 30 tablet, Rfl: 2 .  NIFEdipine (PROCARDIA XL/ADALAT-CC) 90 MG 24 hr tablet, , Disp: , Rfl: 2 .  TRUEPLUS LANCETS 28G MISC, Use as directed, Disp: 100 each, Rfl: 12 .  doxycycline (VIBRA-TABS) 100 MG tablet, , Disp: , Rfl: 0 .  pantoprazole (PROTONIX) 40 MG tablet, Take 1 tablet (40 mg total) by mouth daily. (Patient not taking: Reported on 04/21/2015), Disp: 30 tablet, Rfl: 3 .  rifampin (RIFADIN) 300 MG capsule, Take 1 capsule (300 mg total) by mouth daily. Complete on January 26, 2015. (Patient not taking: Reported on 04/21/2015), Disp: , Rfl:  .  traMADol (ULTRAM) 50 MG tablet, TAKE 1 TABLET BY MOUTH EVERY 8 HOURS AS NEEDED (Patient not taking: Reported on 04/21/2015), Disp: 30 tablet, Rfl: 0     Review of Systems  Constitutional: Negative for fever, chills, diaphoresis, activity change, appetite change, fatigue and unexpected weight change.  HENT: Negative for congestion, rhinorrhea, sinus pressure, sneezing, sore throat and trouble swallowing.   Eyes: Negative for photophobia and visual disturbance.  Respiratory: Negative for cough, chest tightness, shortness of breath, wheezing and stridor.   Cardiovascular: Negative for chest pain, palpitations and leg swelling.  Gastrointestinal: Negative for nausea, vomiting, abdominal pain, diarrhea, constipation, blood in stool, abdominal distention and  anal bleeding.  Genitourinary: Negative for dysuria, hematuria, flank pain and difficulty urinating.  Musculoskeletal: Positive for gait problem. Negative for myalgias, back pain, joint swelling and arthralgias.  Skin: Positive for wound. Negative for color change, pallor and rash.  Neurological: Negative for dizziness, tremors, weakness and light-headedness.  Hematological: Negative for adenopathy. Does not bruise/bleed easily.  Psychiatric/Behavioral: Negative for behavioral problems, confusion, sleep disturbance, dysphoric mood, decreased concentration and agitation.       Objective:   Physical Exam  Constitutional: He is oriented to person, place, and time. He appears well-developed and well-nourished.  HENT:  Head: Normocephalic and atraumatic.  Eyes: Conjunctivae and EOM are normal.  Neck: Normal range of motion. Neck supple.  Cardiovascular: Normal rate, regular rhythm and intact distal pulses.   Murmur heard. Pulmonary/Chest: Effort  normal and breath sounds normal. No respiratory distress. He has no wheezes. He has no rales. He exhibits no tenderness.  Abdominal: Soft. Bowel sounds are normal. He exhibits no distension. There is no tenderness. There is no rebound.  Musculoskeletal: Normal range of motion. He exhibits no edema.  Neurological: He is alert and oriented to person, place, and time.  Skin: Skin is warm and dry. No erythema.  Psychiatric: He has a normal mood and affect. His behavior is normal. Judgment and thought content normal.    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 He has completed IV and oral abx with normalization of inflammatory markers and improvement in clinical picture. He DID have one episode of swelling at the site a week ago but otherwise has done well  IF he is felt to be clear of infection and goes back for THA re-implantation he should have prior HIBICLENS and MUPIROCIN x 7 days and preoperative vancomycin to reduce chances of new MRSA infection  THE LONGER WE WAIT with him off antibiotics prior to pursuing remiplantation the greater confidence we should have that the infection has been eradicated.   Repeat a sedimentation rate and C-reactive protein today along with a basic metabolic panel. If these are worrisome will get imaging   AKI: Most recent serum creatinine was elevated 1.66 not clear what the cause of this was but it could be vancomycin toxicity will recheck a basic metabolic panel today.  Diabetic feet: no overt infection: he does have callus. Getting insulin  ? AVN: per Dr Alvan Dame from [place]. No path sent? : he had prior drinking hx> I suspect that this is simply due to terrible MRSA infection  I spent greater than 25 minutes with the patient including greater than 50% of time in face to face counsel of the patient re his MRSA bacteremia, endocarditis, septic hip, osteo of the femur and his diabetes, and ? AVN preparation for possible THA and in coordination of his care.

## 2015-04-22 ENCOUNTER — Telehealth: Payer: Self-pay | Admitting: *Deleted

## 2015-04-22 ENCOUNTER — Other Ambulatory Visit: Payer: Self-pay | Admitting: *Deleted

## 2015-04-22 LAB — SEDIMENTATION RATE: SED RATE: 11 mm/h (ref 0–20)

## 2015-04-22 MED ORDER — CHLORHEXIDINE GLUCONATE 4 % EX LIQD: Freq: Every day | CUTANEOUS | Status: DC | PRN

## 2015-04-22 NOTE — Telephone Encounter (Signed)
Perfect

## 2015-04-22 NOTE — Telephone Encounter (Signed)
Sent notes to Dr. Nilsa Nuttinglin's attention, faxed [phone].   Relayed information to patient.

## 2015-04-22 NOTE — Telephone Encounter (Signed)
Patient is calling for his lab results and wants to know the next step.  He stated that he wanted to come in today for evaluation, because his leg "really hurts."  Patient then stated that he needed the prescription for hibiclens sent to the pharmacy.  RN sent the rx, asked about the pain.  Patient then stated "it's not that bad, I just want to know if the MRSA is gone." Please advise what the labs show, what the next step is. Andree CossHowell, Michelle M, RN

## 2015-04-22 NOTE — Telephone Encounter (Signed)
His labs are very encouraging with normal ESR and CRP. Can you send these labs to Dr Aurea Graff office. I have no way of proving for sure that the MRSA is gone but the labs are encouraging and the fact that his pain on the whole has been getting better is also encouraging. The longer he waits off antibiotics and we do not see anyt worsening of pain or return of fevers the more sure we can be that this seems to be cured but we can never guarantee that. Right now I am very happy with how things look> HJe needs to followup with Adriana Mccallum asap

## 2015-04-23 ENCOUNTER — Telehealth: Payer: Self-pay | Admitting: *Deleted

## 2015-04-23 NOTE — Telephone Encounter (Signed)
Patient called to check on his labs from 04/21/15. He wants to know if his MRSA is still active. Advised his sed rate and CRP were fine. He wants to know the next step. Advised will ask the doctor and give him a call back.

## 2015-04-23 NOTE — Telephone Encounter (Signed)
The next step is for him to see his surgeon and to also keep his appt on schedule with me

## 2015-05-26 ENCOUNTER — Ambulatory Visit (INDEPENDENT_AMBULATORY_CARE_PROVIDER_SITE_OTHER): Payer: Self-pay | Admitting: Infectious Disease

## 2015-05-26 ENCOUNTER — Encounter: Payer: Self-pay | Admitting: Infectious Disease

## 2015-05-26 VITALS — BP 174/104 | HR 111 | Temp 98.3°F | Ht 71.0 in | Wt 198.0 lb

## 2015-05-26 DIAGNOSIS — B9562 Methicillin resistant Staphylococcus aureus infection as the cause of diseases classified elsewhere: Secondary | ICD-10-CM

## 2015-05-26 DIAGNOSIS — I76 Septic arterial embolism: Secondary | ICD-10-CM

## 2015-05-26 DIAGNOSIS — Z96649 Presence of unspecified artificial hip joint: Secondary | ICD-10-CM

## 2015-05-26 DIAGNOSIS — M86151 Other acute osteomyelitis, right femur: Secondary | ICD-10-CM

## 2015-05-26 DIAGNOSIS — Z794 Long term (current) use of insulin: Secondary | ICD-10-CM

## 2015-05-26 DIAGNOSIS — T8459XA Infection and inflammatory reaction due to other internal joint prosthesis, initial encounter: Secondary | ICD-10-CM | POA: Insufficient documentation

## 2015-05-26 DIAGNOSIS — Z9889 Other specified postprocedural states: Secondary | ICD-10-CM

## 2015-05-26 DIAGNOSIS — E1101 Type 2 diabetes mellitus with hyperosmolarity with coma: Secondary | ICD-10-CM

## 2015-05-26 DIAGNOSIS — I38 Endocarditis, valve unspecified: Secondary | ICD-10-CM

## 2015-05-26 DIAGNOSIS — I2690 Septic pulmonary embolism without acute cor pulmonale: Secondary | ICD-10-CM

## 2015-05-26 DIAGNOSIS — Z96642 Presence of left artificial hip joint: Secondary | ICD-10-CM

## 2015-05-26 DIAGNOSIS — T847XXD Infection and inflammatory reaction due to other internal orthopedic prosthetic devices, implants and grafts, subsequent encounter: Secondary | ICD-10-CM

## 2015-05-26 DIAGNOSIS — T8459XD Infection and inflammatory reaction due to other internal joint prosthesis, subsequent encounter: Secondary | ICD-10-CM

## 2015-05-26 DIAGNOSIS — R7881 Bacteremia: Secondary | ICD-10-CM

## 2015-05-26 HISTORY — DX: Infection and inflammatory reaction due to other internal joint prosthesis, initial encounter: Z96.649

## 2015-05-26 LAB — BASIC METABOLIC PANEL WITH GFR
BUN: 30 mg/dL — ABNORMAL HIGH (ref 7–25)
CALCIUM: 9.5 mg/dL (ref 8.6–10.3)
CHLORIDE: 100 mmol/L (ref 98–110)
CO2: 24 mmol/L (ref 20–31)
CREATININE: 1.76 mg/dL — AB (ref 0.70–1.33)
GFR, Est African American: 50 mL/min — ABNORMAL LOW (ref >=60)
GFR, Est Non African American: 43 mL/min — ABNORMAL LOW (ref >=60)
GLUCOSE: 364 mg/dL — AB (ref 65–99)
Potassium: 4.1 mmol/L (ref 3.5–5.3)
SODIUM: 136 mmol/L (ref 135–146)

## 2015-05-26 LAB — C-REACTIVE PROTEIN: CRP: 0.5 mg/dL (ref <0.60)

## 2015-05-26 NOTE — Progress Notes (Signed)
Chief complaint: Hip pain Subjective:    Patient ID: Marc [name], male    DOB: 1961/03/06, 55 y.o.   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 He underwent surgery on July 26th by Dr. Alvan Dame with:  Left hip resection arthroplasty, placement of temporary antibiotic cemented total hip arthroplasty from [facility]  Patient's head of the femur was unhealthy appearing and Dr Alvan Dame resected it and sent for pathology though I cannot find the path report.   He had been on IV vancomycin and I believe oral rifampin since then.  When I last saw him he had had some  left hip had some postoperative swelling that is better now.   PICC line had been also replaced. I tried to extend his anti-biotics to 8 weeks postoperatively but he was discharged in the skilled nursing was solely with no plans for home IV antibody and therefore his PICC line was discontinued. He was then placed on oral doxycycline which he is continued  past 8 weeks postoperatively.   Hip pain was largely improved had improved and he was  without fevers chills or malaise and his operative site appears to be healing well. When his labs for ESR, CRp were normal I took him off of doxycyline.   He has had no worsening in pain since I last saw him and inflammatory markers were again normal. He has bought hibiclens and mupirocin and been using them though I would like him to use them preoperatively.  Past Medical History  Diagnosis Date  . Diabetes mellitus without complication ([place])   .  Hypertension   . Endocarditis   . Acute osteomyelitis of femur ([place]) 01/19/2015    Past Surgical History  Procedure Laterality Date  . No past surgeries    . Video bronchoscopy Bilateral 12/03/2014    Procedure: VIDEO BRONCHOSCOPY WITHOUT FLUORO;  Surgeon: Chesley Mires, MD;  Location: [facility];  Service: Cardiopulmonary;  Laterality: Bilateral;  . Total hip revision Left 12/14/2014    Procedure: Left hip resection with prostalac ;  Surgeon: Paralee Cancel, MD;  Location: [facility];  Service: Orthopedics;  Laterality: Left;    Family History  Problem Relation Age of Onset  . Hypertension Mother   . Diabetes Sister   . Diabetes Brother       Social History   Social History  . Marital Status: Single    Spouse Name: N/A  . Number of Children: N/A  . Years of Education: N/A   Social History Main Topics  . Smoking status: Never Smoker   . Smokeless tobacco: Never Used  . Alcohol Use: No     Comment: patient states he does not drink on 12/08/14  . Drug Use: No  . Sexual Activity: Not on file   Other Topics Concern  . Not on file   Social History Narrative    No Known Allergies   Current outpatient prescriptions:  .  amLODipine (NORVASC) 10 MG tablet, , Disp: , Rfl: 2 .  Blood Glucose Monitoring Suppl (TRUE  METRIX METER) W/DEVICE KIT, Use as directed, Disp: 1 kit, Rfl: 0 .  chlorhexidine (HIBICLENS) 4 % external liquid, Apply topically daily as needed., Disp: 120 mL, Rfl: 1 .  clotrimazole (CLOTRIMAZOLE AF) 1 % cream, Apply 1 application topically 2 (two) times daily., Disp: 30 g, Rfl: 1 .  doxycycline (VIBRA-TABS) 100 MG tablet, , Disp: , Rfl: 0 .  ferrous sulfate 325 (65 FE) MG tablet, Take 1 tablet (325 mg total) by mouth 3 (three) times daily after meals., Disp: 90 tablet, Rfl: 3 .  glipiZIDE (GLUCOTROL) 10 MG tablet, Take 1 tablet (10 mg total) by mouth 2 (two) times daily before a meal., Disp: 60 tablet, Rfl: 3 .  Glucose Blood (TRUE METRIX BLOOD GLUCOSE TEST VI), ,  Disp: , Rfl: 0 .  glucose blood (TRUE METRIX BLOOD GLUCOSE TEST) test strip, Use as instructed, Disp: 100 each, Rfl: 12 .  glucose blood test strip, , Disp: , Rfl: 12 .  ibuprofen (ADVIL,MOTRIN) 600 MG tablet, , Disp: , Rfl: 0 .  insulin glargine (LANTUS) 100 UNIT/ML injection, Inject 0.15 mLs (15 Units total) into the skin at bedtime., Disp: 10 mL, Rfl: 3 .  lisinopril (PRINIVIL,ZESTRIL) 5 MG tablet, Take 1 tablet (5 mg total) by mouth daily., Disp: 30 tablet, Rfl: 3 .  Multiple Vitamin (MULTIVITAMIN WITH MINERALS) TABS tablet, Take 1 tablet by mouth daily., Disp: 30 tablet, Rfl: 0 .  mupirocin ointment (BACTROBAN) 2 %, Place 1 application into the nose 2 (two) times daily., Disp: 30 g, Rfl: 1 .  NIFEdipine (ADALAT CC) 90 MG 24 hr tablet, Take 1 tablet (90 mg total) by mouth daily., Disp: 30 tablet, Rfl: 2 .  NIFEdipine (PROCARDIA XL/ADALAT-CC) 90 MG 24 hr tablet, , Disp: , Rfl: 2 .  pantoprazole (PROTONIX) 40 MG tablet, Take 1 tablet (40 mg total) by mouth daily. (Patient not taking: Reported on 04/21/2015), Disp: 30 tablet, Rfl: 3 .  traMADol (ULTRAM) 50 MG tablet, TAKE 1 TABLET BY MOUTH EVERY 8 HOURS AS NEEDED (Patient not taking: Reported on 04/21/2015), Disp: 30 tablet, Rfl: 0 .  TRUEPLUS LANCETS 28G MISC, Use as directed, Disp: 100 each, Rfl: 12     Review of Systems  Constitutional: Negative for fever, chills, diaphoresis, activity change, appetite change, fatigue and unexpected weight change.  HENT: Negative for congestion, rhinorrhea, sinus pressure, sneezing, sore throat and trouble swallowing.   Eyes: Negative for photophobia and visual disturbance.  Respiratory: Negative for cough, chest tightness, shortness of breath, wheezing and stridor.   Cardiovascular: Negative for chest pain, palpitations and leg swelling.  Gastrointestinal: Negative for nausea, vomiting, abdominal pain, diarrhea, constipation, blood in stool, abdominal distention and anal bleeding.  Genitourinary: Negative  for dysuria, hematuria, flank pain and difficulty urinating.  Musculoskeletal: Positive for gait problem. Negative for myalgias, back pain, joint swelling and arthralgias.  Skin: Positive for wound. Negative for color change, pallor and rash.  Neurological: Negative for dizziness, tremors, weakness and light-headedness.  Hematological: Negative for adenopathy. Does not bruise/bleed easily.  Psychiatric/Behavioral: Negative for behavioral problems, confusion, sleep disturbance, dysphoric mood, decreased concentration and agitation.       Objective:   Physical Exam  Constitutional: He is oriented to person, place, and time. He appears well-developed and well-nourished.  HENT:  Head: Normocephalic and atraumatic.  Eyes: Conjunctivae and EOM are normal.  Neck: Normal range of motion. Neck supple.  Cardiovascular: Normal rate, regular rhythm and intact distal pulses.   Murmur heard. Pulmonary/Chest: Effort normal and breath sounds normal.  No respiratory distress. He has no wheezes. He has no rales. He exhibits no tenderness.  Abdominal: Soft. Bowel sounds are normal. He exhibits no distension. There is no tenderness. There is no rebound.  Musculoskeletal: Normal range of motion. He exhibits no edema.  Neurological: He is alert and oriented to person, place, and time.  Skin: Skin is warm and dry. No erythema.  Psychiatric: He has a normal mood and affect. His behavior is normal. Judgment and thought content normal.    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 He has completed IV and oral abx with normalization of inflammatory markers and improvement in clinical picture. He DID have one episode of swelling at the site a week prior to last visit but none since then.  We will recheck inflammatory markers today again still OFF abx  IF he is felt to be clear of infection and goes back for THA re-implantation he should have prior HIBICLENS and MUPIROCIN x 7 days and preoperative vancomycin to reduce chances of new MRSA infection   AKI: Most recent serum creatinine was elevated 1.66 not clear what the cause of this was but it could be vancomycin toxicity will recheck a basic metabolic panel today.  Diabetic feet: no overt infection: he does have callus. Getting insulin  ? AVN: per Dr Alvan Dame from [place]. No path sent? : he had prior drinking hx> I suspect that this is simply due to terrible MRSA infection  I spent greater than 25 minutes with the patient including greater than 50% of time in face to face counsel of the patient re his MRSA bacteremia, endocarditis, septic hip, osteo of the femur and his diabetes, and ? AVN preparation for possible THA and in coordination of his care.

## 2015-05-26 NOTE — Patient Instructions (Signed)
If your labs look good today off antibiotics AND Dr. Charlann Boxerlin proceeds with surgery in February   I want you ONE WEEK before surgery to   Start and wash head to toe nightly with HIBICLENS   And   TWICE Daily use mupirocin cream in your nose  AND  MAKE SURE THAT ANESTHESIA GIVES YOU A DOSE OF VANCOMYCIN ONE HOUR BEFORE YOUR SURGERY

## 2015-05-27 LAB — SEDIMENTATION RATE: Sed Rate: 9 mm/hr (ref 0–20)

## 2015-07-07 MED FILL — NIFEDIPINE ER 90 MG TABLET: 90 | 30 days supply | Qty: 30 | Fill #2

## 2015-07-07 MED FILL — LISINOPRIL 5 MG TABLET: 5 | 30 days supply | Qty: 30 | Fill #3

## 2015-07-07 MED FILL — glipiZIDE 10 MG TABS: 10 | 30 days supply | Qty: 60 | Fill #2

## 2015-07-07 NOTE — H&P (Signed)
TOTAL HIP REVISION ADMISSION H&P  Patient is admitted for left revision total hip arthroplasty with resection of antibiotic spacer.  Subjective:  Chief Complaint:    S/p left hip native hip resection do to infection with placement of antibiotic spacer  HPI: Marc [name], 55 y.o. male, has a history of pain and functional disability in the left hip due to resection of native hip do to infection and patient has been treated with IV antibiotics without reoccurance of infection symptoms. The indications for the revision total hip arthroplasty are to place a THA after removal of antibiotic spacer.   Prior procedures on the left hip include removal of native hip and placement of antibiotic spacer on 12/14/2015.  Patient currently rates pain in the left hip at 0 out of 10.  There is no pain at this time, but looking forward to getting a THA in place. Patient has evidence of antibiotic spacer placement by imaging studies.  This condition presents safety issues increasing the risk of falls.    There is no current active infection.   Risks, benefits and expectations were discussed with the patient.  Risks including but not limited to the risk of anesthesia, blood clots, nerve damage, blood vessel damage, failure of the prosthesis, infection and up to and including death.  Patient understand the risks, benefits and expectations and wishes to proceed with surgery.   PCP: No PCP Per Patient  D/C Plans:      SNF  Post-op Meds:       No Rx given   Tranexamic Acid:      To be given - IV  Decadron:      Is to be given  FYI:     ASA post-op  Norco post-op    Patient Active Problem List   Diagnosis Date Noted  . 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 Acute osteomyelitis of femur (HCC) 01/19/2015  . History of arthroplasty of left hip 12/21/2014  . Hypoglycemia   . Septic arthritis (HCC) 12/12/2014  . Uncontrolled diabetes mellitus (HCC) 12/12/2014  . Anemia 12/12/2014  . Hip  pain   . Endocarditis 12/08/2014  . Non-compliance with treatment 12/08/2014  . Septic embolism (HCC)   . MRSA bacteremia 11/30/2014  . Diabetes mellitus type 2, uncontrolled (HCC) 11/30/2014  . Leukocytosis   . Sepsis (HCC) 11/20/2014  . Bladder outlet obstruction 11/20/2014  . Obstructive uropathy 11/20/2014  . DKA, type 2, not at goal [facility]) 11/20/2014  . Acute renal failure (HCC) 11/17/2014  . Hyponatremia 11/17/2014  . Hypokalemia 11/17/2014  . Essential hypertension 11/17/2014  . Hyperglycemia 11/16/2014  . Hyperglycemia due to type 2 diabetes mellitus (HCC) 11/16/2014   Past Medical History  Diagnosis Date  . Diabetes mellitus without complication (HCC)   . Hypertension   . Endocarditis   . Acute osteomyelitis of femur (HCC) 01/19/2015  . 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 No past surgeries    . Video bronchoscopy Bilateral 12/03/2014    Procedure: VIDEO BRONCHOSCOPY WITHOUT FLUORO;  Surgeon: Coralyn Helling, MD;  Location: [facility];  Service: Cardiopulmonary;  Laterality: Bilateral;  . Total hip revision Left 12/14/2014    Procedure: Left hip resection with prostalac ;  Surgeon: Durene Romans, MD;  Location: [facility] OR[facility];  Service: Orthopedics;  Laterality: Left;    No prescriptions prior to admission   No Known Allergies   Social History  Substance Use Topics  . Smoking status: Never Smoker   . Smokeless tobacco: Never Used  . Alcohol Use: No     Comment: patient states he does not drink on 12/08/14    Family History  Problem Relation Age of Onset  . Hypertension Mother   . Diabetes Sister   . Diabetes Brother       Review of Systems  Constitutional: Negative.   HENT: Negative.   Eyes: Negative.   Respiratory: Negative.   Cardiovascular: Negative.   Gastrointestinal: Negative.   Genitourinary: Negative.   Musculoskeletal:       Antibiotic spacer in the left hip  Skin: Negative.    Neurological: Negative.   Endo/Heme/Allergies: Negative.   Psychiatric/Behavioral: Negative.     Objective:  Physical Exam  Constitutional: He is oriented to person, place, and time. He appears well-developed.  HENT:  Head: Normocephalic.  Eyes: Pupils are equal, round, and reactive to light.  Neck: Neck supple. No JVD present. No tracheal deviation present. No thyromegaly present.  Cardiovascular: Normal rate, regular rhythm, normal heart sounds and intact distal pulses.   Respiratory: Effort normal and breath sounds normal. No stridor. No respiratory distress. He has no wheezes.  GI: Soft. There is no tenderness. There is no guarding.  Musculoskeletal:       Left hip: He exhibits decreased range of motion, decreased strength and laceration (healed previous incision). He exhibits no tenderness, no bony tenderness, no swelling, no crepitus and no deformity.  Lymphadenopathy:    He has no cervical adenopathy.  Neurological: He is alert and oriented to person, place, and time.  Skin: Skin is warm and dry.  Psychiatric: He has a normal mood and affect.      Labs:  Estimated body mass index is 27.41 kg/(m^2) as calculated from the following:   Height as of 03/09/15:  (1.778 m).   Weight as of 04/21/15: 86.637 kg (191 lb).  Imaging Review:  Plain radiographs demonstrate previous antibiotic spacer placement of the left hip(s). The bone quality appears to be good for age and reported activity level.  Assessment/Plan:  Left hip with antibiotic spacer after native hip infection.  The patient history, physical examination, clinical judgement of the provider and imaging studies are consistent with previous placement of antibiotic spacer in the left hip, infected native hip. Revision total hip arthroplasty is deemed medically necessary. The treatment options including medical management, injection therapy, arthroscopy and arthroplasty were discussed at length. The risks and  benefits of total hip arthroplasty were presented and reviewed. The risks due to aseptic loosening, infection, stiffness, dislocation/subluxation,  thromboembolic complications and other imponderables were discussed.  The patient acknowledged the explanation, agreed to proceed with the plan and consent was signed. Patient is being admitted for inpatient treatment for surgery, pain control, PT, OT, prophylactic antibiotics, VTE prophylaxis, progressive ambulation and ADL's and discharge planning. The patient is planning to be discharged to skilled nursing facility.     Anastasio Auerbach Jhade Berko   PA-C  07/07/2015, 1:11 PM

## 2015-07-08 NOTE — Patient Instructions (Addendum)
Marc [name]  07/08/2015   Your procedure is scheduled on: 07/19/2015    Report to [facility] Main  Entrance take [place]  elevators to 3rd floor to  Short Stay Center at     0530  AM.  Call this number if you have problems the morning of surgery [phone]   Remember: ONLY 1 PERSON MAY GO WITH YOU TO SHORT STAY TO GET  READY MORNING OF YOUR SURGERY.  Do not eat food or drink liquids :After Midnight.             Eat a good healthy snack prior to bedtime.      Take these medicines the morning of surgery with A SIP OF WATER: DO NOT TAKE ANY DIABETIC MEDICATIONS DAY OF YOUR SURGERY                               You may not have any metal on your body including hair pins and              piercings  Do not wear jewelry, , lotions, powders or perfumes, deodorant                       Men may shave face and neck.   Do not bring valuables to the hospital. [facility] IS NOT             RESPONSIBLE   FOR VALUABLES.  Contacts, dentures or bridgework may not be worn into surgery.  Leave suitcase in the car. After surgery it may be brought to your room.     Special Instructions: coughing and deep breathing exercises, leg exercises               Please read over the following fact sheets you were given: _____________________________________________________________________             [facility] - Preparing for Surgery Before surgery, you can play an important role.  Because skin is not sterile, your skin needs to be as free of germs as possible.  You can reduce the number of germs on your skin by washing with CHG (chlorahexidine gluconate) soap before surgery.  CHG is an antiseptic cleaner which kills germs and bonds with the skin to continue killing germs even after washing. Please DO NOT use if you have an allergy to CHG or antibacterial soaps.  If your skin becomes reddened/irritated stop using the CHG and inform your nurse when you arrive at Short Stay. Do  not shave (including legs and underarms) for at least 48 hours prior to the first CHG shower.  You may shave your face/neck. Please follow these instructions carefully:  1.  Shower with CHG Soap the night before surgery and the  morning of Surgery.  2.  If you choose to wash your hair, wash your hair first as usual with your  normal  shampoo.  3.  After you shampoo, rinse your hair and body thoroughly to remove the  shampoo.                           4.  Use CHG as you would any other liquid soap.  You can apply chg directly  to the skin and wash  Gently with a scrungie or clean washcloth.  5.  Apply the CHG Soap to your body ONLY FROM THE NECK DOWN.   Do not use on face/ open                           Wound or open sores. Avoid contact with eyes, ears mouth and genitals (private parts).                       Wash face,  Genitals (private parts) with your normal soap.             6.  Wash thoroughly, paying special attention to the area where your surgery  will be performed.  7.  Thoroughly rinse your body with warm water from the neck down.  8.  DO NOT shower/wash with your normal soap after using and rinsing off  the CHG Soap.                9.  Pat yourself dry with a clean towel.            10.  Wear clean pajamas.            11.  Place clean sheets on your bed the night of your first shower and do not  sleep with pets. Day of Surgery : Do not apply any lotions/deodorants the morning of surgery.  Please wear clean clothes to the hospital/surgery center.  FAILURE TO FOLLOW THESE INSTRUCTIONS MAY RESULT IN THE CANCELLATION OF YOUR SURGERY PATIENT SIGNATURE_________________________________  NURSE SIGNATURE__________________________________  ________________________________________________________________________  WHAT IS A BLOOD TRANSFUSION? Blood Transfusion Information  A transfusion is the replacement of blood or some of its parts. Blood is made up of multiple  cells which provide different functions.  Red blood cells carry oxygen and are used for blood loss replacement.  White blood cells fight against infection.  Platelets control bleeding.  Plasma helps clot blood.  Other blood products are available for specialized needs, such as hemophilia or other clotting disorders. BEFORE THE TRANSFUSION  Who gives blood for transfusions?   Healthy volunteers who are fully evaluated to make sure their blood is safe. This is blood bank blood. Transfusion therapy is the safest it has ever been in the practice of medicine. Before blood is taken from a donor, a complete history is taken to make sure that person has no history of diseases nor engages in risky social behavior (examples are intravenous drug use or sexual activity with multiple partners). The donor's travel history is screened to minimize risk of transmitting infections, such as malaria. The donated blood is tested for signs of infectious diseases, such as HIV and hepatitis. The blood is then tested to be sure it is compatible with you in order to minimize the chance of a transfusion reaction. If you or a relative donates blood, this is often done in anticipation of surgery and is not appropriate for emergency situations. It takes many days to process the donated blood. RISKS AND COMPLICATIONS Although transfusion therapy is very safe and saves many lives, the main dangers of transfusion include:  1. Getting an infectious disease. 2. Developing a transfusion reaction. This is an allergic reaction to something in the blood you were given. Every precaution is taken to prevent this. The decision to have a blood transfusion has been considered carefully by your caregiver before blood is given. Blood is not given unless the benefits outweigh  the risks. AFTER THE TRANSFUSION  Right after receiving a blood transfusion, you will usually feel much better and more energetic. This is especially true if your red  blood cells have gotten low (anemic). The transfusion raises the level of the red blood cells which carry oxygen, and this usually causes an energy increase.  The nurse administering the transfusion will monitor you carefully for complications. HOME CARE INSTRUCTIONS  No special instructions are needed after a transfusion. You may find your energy is better. Speak with your caregiver about any limitations on activity for underlying diseases you may have. SEEK MEDICAL CARE IF:   Your condition is not improving after your transfusion.  You develop redness or irritation at the intravenous (IV) site. SEEK IMMEDIATE MEDICAL CARE IF:  Any of the following symptoms occur over the next 12 hours:  Shaking chills.  You have a temperature by mouth above 102 F (38.9 C), not controlled by medicine.  Chest, back, or muscle pain.  People around you feel you are not acting correctly or are confused.  Shortness of breath or difficulty breathing.  Dizziness and fainting.  You get a rash or develop hives.  You have a decrease in urine output.  Your urine turns a dark color or changes to pink, red, or brown. Any of the following symptoms occur over the next 10 days:  You have a temperature by mouth above 102 F (38.9 C), not controlled by medicine.  Shortness of breath.  Weakness after normal activity.  The white part of the eye turns yellow (jaundice).  You have a decrease in the amount of urine or are urinating less often.  Your urine turns a dark color or changes to pink, red, or brown. Document Released: 05/05/2000 Document Revised: 07/31/2011 Document Reviewed: 12/23/2007 Exit[employer] Patient Information 2014 [place].  _______________________________________________________________________  Incentive Spirometer  An incentive spirometer is a tool that can help keep your lungs clear and active. This tool measures how well you are filling your lungs with each breath. Taking  long deep breaths may help reverse or decrease the chance of developing breathing (pulmonary) problems (especially infection) following:  A long period of time when you are unable to move or be active. BEFORE THE PROCEDURE   If the spirometer includes an indicator to show your best effort, your nurse or respiratory therapist will set it to a desired goal.  If possible, sit up straight or lean slightly forward. Try not to slouch.  Hold the incentive spirometer in an upright position. INSTRUCTIONS FOR USE  3. Sit on the edge of your bed if possible, or sit up as far as you can in bed or on a chair. 4. Hold the incentive spirometer in an upright position. 5. Breathe out normally. 6. Place the mouthpiece in your mouth and seal your lips tightly around it. 7. Breathe in slowly and as deeply as possible, raising the piston or the ball toward the top of the column. 8. Hold your breath for 3-5 seconds or for as long as possible. Allow the piston or ball to fall to the bottom of the column. 9. Remove the mouthpiece from your mouth and breathe out normally. 10. Rest for a few seconds and repeat Steps 1 through 7 at least 10 times every 1-2 hours when you are awake. Take your time and take a few normal breaths between deep breaths. 11. The spirometer may include an indicator to show your best effort. Use the indicator as a goal to work  toward during each repetition. 12. After each set of 10 deep breaths, practice coughing to be sure your lungs are clear. If you have an incision (the cut made at the time of surgery), support your incision when coughing by placing a pillow or rolled up towels firmly against it. Once you are able to get out of bed, walk around indoors and cough well. You may stop using the incentive spirometer when instructed by your caregiver.  RISKS AND COMPLICATIONS  Take your time so you do not get dizzy or light-headed.  If you are in pain, you may need to take or ask for pain  medication before doing incentive spirometry. It is harder to take a deep breath if you are having pain. AFTER USE  Rest and breathe slowly and easily.  It can be helpful to keep track of a log of your progress. Your caregiver can provide you with a simple table to help with this. If you are using the spirometer at home, follow these instructions: [place] IF:   You are having difficultly using the spirometer.  You have trouble using the spirometer as often as instructed.  Your pain medication is not giving enough relief while using the spirometer.  You develop fever of 100.5 F (38.1 C) or higher. SEEK IMMEDIATE MEDICAL CARE IF:   You cough up bloody sputum that had not been present before.  You develop fever of 102 F (38.9 C) or greater.  You develop worsening pain at or near the incision site. MAKE SURE YOU:   Understand these instructions.  Will watch your condition.  Will get help right away if you are not doing well or get worse. Document Released: 09/18/2006 Document Revised: 07/31/2011 Document Reviewed: 11/19/2006 [facility] Patient Information 2014 [address] [place], [place].   ________________________________________________________________________

## 2015-07-09 ENCOUNTER — Ambulatory Visit (HOSPITAL_COMMUNITY)
Admission: RE | Admit: 2015-07-09 | Discharge: 2015-07-09 | Disposition: A | Discharge status: Home / Self Care | Payer: Medicaid Other | Source: Ambulatory Visit | Attending: Anesthesiology | Admitting: Anesthesiology

## 2015-07-09 ENCOUNTER — Encounter (HOSPITAL_COMMUNITY)
Admission: RE | Admit: 2015-07-09 | Discharge: 2015-07-09 | Disposition: A | Discharge status: Home / Self Care | Payer: Medicaid Other | Source: Ambulatory Visit | Attending: Orthopedic Surgery | Admitting: Orthopedic Surgery

## 2015-07-09 ENCOUNTER — Encounter (HOSPITAL_COMMUNITY): Payer: Self-pay

## 2015-07-09 DIAGNOSIS — Z01818 Encounter for other preprocedural examination: Secondary | ICD-10-CM | POA: Diagnosis present

## 2015-07-09 DIAGNOSIS — Z01812 Encounter for preprocedural laboratory examination: Secondary | ICD-10-CM | POA: Insufficient documentation

## 2015-07-09 LAB — CBC
HEMATOCRIT: 36.6 % — AB (ref 39.0–52.0)
HEMOGLOBIN: 11.6 g/dL — AB (ref 13.0–17.0)
MCH: 27.4 pg (ref 26.0–34.0)
MCHC: 31.7 g/dL (ref 30.0–36.0)
MCV: 86.3 fL (ref 78.0–100.0)
Platelets: 200 10*3/uL (ref 150–400)
RBC: 4.24 MIL/uL (ref 4.22–5.81)
RDW: 13.7 % (ref 11.5–15.5)
WBC: 6.2 10*3/uL (ref 4.0–10.5)

## 2015-07-09 LAB — BASIC METABOLIC PANEL
ANION GAP: 9 (ref 5–15)
BUN: 30 mg/dL — ABNORMAL HIGH (ref 6–20)
CALCIUM: 9.2 mg/dL (ref 8.9–10.3)
CO2: 26 mmol/L (ref 22–32)
Chloride: 101 mmol/L (ref 101–111)
Creatinine, Ser: 1.86 mg/dL — ABNORMAL HIGH (ref 0.61–1.24)
GFR, EST AFRICAN AMERICAN: 46 mL/min — AB (ref >60)
GFR, EST NON AFRICAN AMERICAN: 40 mL/min — AB (ref >60)
Glucose, Bld: 301 mg/dL — ABNORMAL HIGH (ref 65–99)
POTASSIUM: 4.8 mmol/L (ref 3.5–5.1)
SODIUM: 136 mmol/L (ref 135–145)

## 2015-07-09 LAB — URINE MICROSCOPIC-ADD ON
RBC / HPF: NONE SEEN RBC/hpf (ref 0–5)
Squamous Epithelial / LPF: NONE SEEN

## 2015-07-09 LAB — URINALYSIS, ROUTINE W REFLEX MICROSCOPIC
Bilirubin Urine: NEGATIVE
HGB URINE DIPSTICK: NEGATIVE
KETONES UR: NEGATIVE mg/dL
LEUKOCYTES UA: NEGATIVE
Nitrite: NEGATIVE
PH: 5 (ref 5.0–8.0)
PROTEIN: NEGATIVE mg/dL
Specific Gravity, Urine: 1.025 (ref 1.005–1.030)

## 2015-07-09 LAB — PROTIME-INR
INR: 0.9 (ref 0.00–1.49)
PROTHROMBIN TIME: 12.4 s (ref 11.6–15.2)

## 2015-07-09 LAB — SURGICAL PCR SCREEN
MRSA, PCR: NEGATIVE
Staphylococcus aureus: NEGATIVE

## 2015-07-09 LAB — APTT: APTT: 31 s (ref 24–37)

## 2015-07-09 NOTE — Progress Notes (Addendum)
BMP done 07/09/15 faxed via EPIC to dr Charlann Boxer.  U/A results faxed via EPIC to Dr Luna Fuse.

## 2015-07-10 LAB — HEMOGLOBIN A1C
Hgb A1c MFr Bld: 10.1 % — ABNORMAL HIGH (ref 4.8–5.6)
Mean Plasma Glucose: 243 mg/dL

## 2015-07-12 NOTE — Progress Notes (Signed)
Final EKG done 07/09/15 in EPIC.

## 2015-07-12 NOTE — Progress Notes (Signed)
HGA1C done 07/09/15 faxed via EPIC to Dr Charlann Boxer.

## 2015-07-12 NOTE — Progress Notes (Signed)
Called patient to clarify medications since patient was supposed to call me on 07/12/15 and he had not .  I called patient and the phone connection was breaking in and out and I told him I would call him back tomorrow on 07/13/2015 to clarify current medications he is taking.

## 2015-07-13 NOTE — Progress Notes (Signed)
Called patient and left a message to call me back to clarify medications on 07/13/2015.    Left message on phone number of [phone] since unable to clarify medications on 07/12/15 due to bad phone connection.

## 2015-07-19 ENCOUNTER — Inpatient Hospital Stay (HOSPITAL_COMMUNITY): Admission: RE | Admit: 2015-07-19 | Payer: Medicaid Other | Source: Ambulatory Visit | Admitting: Orthopedic Surgery

## 2015-07-19 ENCOUNTER — Encounter (HOSPITAL_COMMUNITY): Admission: RE | Payer: Self-pay | Source: Ambulatory Visit

## 2015-07-19 LAB — TYPE AND SCREEN
ABO/RH(D): A POS
Antibody Screen: NEGATIVE

## 2015-07-19 SURGERY — CONVERSION, PREVIOUS HIP SURGERY, TO TOTAL HIP ARTHROPLASTY
Anesthesia: Spinal | Laterality: Left

## 2015-07-20 ENCOUNTER — Other Ambulatory Visit: Payer: Self-pay | Admitting: Pharmacist

## 2015-07-20 MED ORDER — GLUCOSE BLOOD VI STRP: ORAL_STRIP | Status: DC

## 2015-07-20 MED ORDER — ACCU-CHEK SOFTCLIX LANCETS MISC: Status: DC

## 2015-07-20 MED ORDER — ACCU-CHEK AVIVA PLUS W/DEVICE KIT: PACK | Status: AC

## 2015-07-20 MED FILL — glipiZIDE 10 MG TABS: 10 | 30 days supply | Qty: 60 | Fill #3

## 2015-10-21 ENCOUNTER — Ambulatory Visit: Payer: Medicaid Other | Admitting: Family Medicine

## 2015-10-25 ENCOUNTER — Ambulatory Visit: Payer: Medicaid Other | Admitting: Family Medicine

## 2015-10-28 ENCOUNTER — Ambulatory Visit: Payer: Medicaid Other | Admitting: Family Medicine

## 2015-11-25 ENCOUNTER — Encounter (HOSPITAL_COMMUNITY): Payer: Self-pay | Admitting: Emergency Medicine

## 2015-11-25 ENCOUNTER — Emergency Department (HOSPITAL_COMMUNITY)
Admission: EM | Admit: 2015-11-25 | Discharge: 2015-11-26 | Disposition: A | Discharge status: Home / Self Care | Payer: Medicaid Other | Attending: Emergency Medicine | Admitting: Emergency Medicine

## 2015-11-25 DIAGNOSIS — E119 Type 2 diabetes mellitus without complications: Secondary | ICD-10-CM | POA: Insufficient documentation

## 2015-11-25 DIAGNOSIS — Z7984 Long term (current) use of oral hypoglycemic drugs: Secondary | ICD-10-CM | POA: Diagnosis not present

## 2015-11-25 DIAGNOSIS — L02419 Cutaneous abscess of limb, unspecified: Secondary | ICD-10-CM

## 2015-11-25 DIAGNOSIS — L02415 Cutaneous abscess of right lower limb: Secondary | ICD-10-CM | POA: Diagnosis not present

## 2015-11-25 DIAGNOSIS — I10 Essential (primary) hypertension: Secondary | ICD-10-CM | POA: Diagnosis not present

## 2015-11-25 DIAGNOSIS — M79604 Pain in right leg: Secondary | ICD-10-CM | POA: Diagnosis present

## 2015-11-25 MED ORDER — LIDOCAINE-EPINEPHRINE 2 %-1:100000 IJ SOLN
10.0000 mL | Freq: Once | INTRAMUSCULAR | Status: AC
  Administered 2015-11-25: 10 mL
  Filled 2015-11-25: qty 1

## 2015-11-25 NOTE — ED Provider Notes (Signed)
CSN: [id number]     Arrival date & time 11/25/15  2203 History  By signing my name below, I, Marc [name], attest that this documentation has been prepared under the direction and in the presence of non-physician practitioner, Jaquita Folds, PA-C. Electronically Signed: Higinio [name], Scribe. 11/25/2015. 11:44 PM.   Chief Complaint  Patient presents with  . Insect Bite   The history is provided by the patient. No language interpreter was used.   HPI Comments: Marc [name] is a 55 y.o. male with PMHx of DM and HTN, who presents to the Emergency Department complaining of a gradually worsening, wound/Bump to his right lower leg that began ~3 days ago. He reports associated swelling to his right lower leg. Pt notes it came to a head and burst yesterday. He denies seeing anything bite him. He also denies fever, rash, recent travel, recent surgeries and any known allergies. He states he takes metformin and lasix for his DM though he needs a refill.   Past Medical History  Diagnosis Date  . Diabetes mellitus without complication ([place])   . Hypertension   . Endocarditis   . Acute osteomyelitis of femur ([place]) 01/19/2015  . 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 No past surgeries    . Video bronchoscopy Bilateral 12/03/2014    Procedure: VIDEO BRONCHOSCOPY WITHOUT FLUORO;  Surgeon: Chesley Mires, MD;  Location: [facility];  Service: Cardiopulmonary;  Laterality: Bilateral;  . Total hip revision Left 12/14/2014    Procedure: Left hip resection with prostalac ;  Surgeon: Paralee Cancel, MD;  Location: [facility];  Service: Orthopedics;  Laterality: Left;   Family History  Problem Relation Age of Onset  . Hypertension Mother   . Diabetes Sister   . Diabetes Brother    Social History  Substance Use Topics  . Smoking status: Never Smoker   . Smokeless tobacco: Never Used  . Alcohol Use: No     Comment: noen since 2016     Review of  Systems 10 systems reviewed and all are negative for acute change except as noted in the HPI.  Allergies  Review of patient's allergies indicates no known allergies.  Home Medications   Prior to Admission medications   Medication Sig Start Date End Date Taking? Authorizing Provider  ACCU-CHEK SOFTCLIX LANCETS lancets Use as instructed 07/20/15   Tresa Garter, MD  Blood Glucose Monitoring Suppl (ACCU-CHEK AVIVA PLUS) w/Device KIT USE AS DIRECTED 07/20/15   Tresa Garter, MD  ferrous sulfate 325 (65 FE) MG tablet Take 1 tablet (325 mg total) by mouth 3 (three) times daily after meals. 02/17/15   Arnoldo Morale, MD  glipiZIDE (GLUCOTROL) 10 MG tablet Take 1 tablet (10 mg total) by mouth 2 (two) times daily before a meal. 02/17/15   Arnoldo Morale, MD  glucose blood (ACCU-CHEK AVIVA PLUS) test strip Use as instructed 07/20/15   Tresa Garter, MD  lisinopril (PRINIVIL,ZESTRIL) 5 MG tablet Take 1 tablet (5 mg total) by mouth daily. 02/11/15   Arnoldo Morale, MD  NIFEdipine (ADALAT CC) 90 MG 24 hr tablet Take 1 tablet (90 mg total) by mouth daily. 02/17/15   Arnoldo Morale, MD  sulfamethoxazole-trimethoprim (BACTRIM DS,SEPTRA DS) 800-160 MG tablet Take 1 tablet by mouth 2 (two) times daily. 11/26/15 12/03/15  Comer Locket, PA-C  traMADol (ULTRAM) 50 MG tablet TAKE 1 TABLET BY MOUTH EVERY 8 HOURS AS NEEDED Patient taking differently: TAKE 1  TABLET BY MOUTH EVERY 8 HOURS AS NEEDED FOR PAIN 04/09/15   Arnoldo Morale, MD   BP 193/110 mmHg  Pulse 118  Temp(Src) 98.7 F (37.1 C) (Oral)  Resp 18  SpO2 99% Physical Exam  Constitutional: He is oriented to person, place, and time. He appears well-developed and well-nourished.  HENT:  Head: Normocephalic and atraumatic.  Eyes: Conjunctivae are normal. Pupils are equal, round, and reactive to light. Right eye exhibits no discharge. Left eye exhibits no discharge. No scleral icterus.  Neck: Normal range of motion. No JVD present. No tracheal deviation  present.  Cardiovascular:  Mild tachycardia with normal heart sounds   Pulmonary/Chest: Effort normal. No stridor.  Musculoskeletal:  Abscess to anterior right shin, actively draining Abscess is ~4cm in diameter Surrounding induration consistent with cellulitis Muscle compartment soft. Full active range of motion.  Neurological: He is alert and oriented to person, place, and time. Coordination normal.  Psychiatric: He has a normal mood and affect. His behavior is normal. Judgment and thought content normal.  Nursing note and vitals reviewed.   ED Course  Procedures  DIAGNOSTIC STUDIES:  Oxygen Saturation is 99% on RA, normal by my interpretation.    COORDINATION OF CARE:  11:40 PM Discussed treatment [name] with pt at bedside and pt agreed to [name].  Labs Review Labs Reviewed - No data to display  Imaging Review No results found. I have personally reviewed and evaluated these images and lab results as part of my medical decision-making.   EKG Interpretation None     INCISION AND DRAINAGE Performed by: Verl Dicker Consent: Verbal consent obtained. Risks and benefits: risks, benefits and alternatives were discussed Type: abscess  Body area: R leg  Anesthesia: local infiltration  Incision was made with a scalpel.  Local anesthetic: lidocaine 2 % without epinephrine  Anesthetic total: 4 ml  Complexity: complex Blunt dissection to break up loculations  Drainage: purulent  Drainage amount: Moderate   No packing   Patient tolerance: Patient tolerated the procedure well with no immediate complications.    MDM  Patient with skin abscess amenable to incision and drainage.  Abscess was not large enough to warrant packing or drain,  wound recheck in 2 days. Encouraged home warm soaks and flushing.  Mild signs of cellulitis is surrounding skin. DC with Bactrim therapy. Will d/c to home.  Patient does have hypertension, states he is seeing his PCP tomorrow for  blood pressure recheck and medication refill. No symptoms of hypertensive urgency or emergency. He overall appears well, nontoxic and appropriate for discharge.  Final diagnoses:  Leg abscess    I personally performed the services described in this documentation, which was scribed in my presence. The recorded information has been reviewed and is accurate.   Comer Locket, PA-C 11/26/15 0128  Orpah Greek, MD 11/26/15 [phone]

## 2015-11-25 NOTE — ED Notes (Signed)
Pt c/o insect bite to R lower leg x 1 week. Pt unsure what bit him.  Pt sts the site came to a head and burst yesterday. Pt did have swelling to feet and leg that he states has subsided greatly. Pt A&Ox4 and ambulatory.

## 2015-11-26 MED ORDER — SULFAMETHOXAZOLE-TRIMETHOPRIM 800-160 MG PO TABS: 1.0000 | ORAL_TABLET | Freq: Two times a day (BID) | ORAL | Status: AC

## 2015-11-26 NOTE — Discharge Instructions (Signed)
Please take your antibiotics as prescribed, do not save or share them. Follow-up with your doctor tomorrow for blood pressure recheck and medication refill. He will need a wound recheck also in 2-3 days. Return to ED for new or worsening symptoms as we discussed.  Abscess An abscess is an infected area that contains a collection of pus and debris.It can occur in almost any part of the body. An abscess is also known as a furuncle or boil. CAUSES  An abscess occurs when tissue gets infected. This can occur from blockage of oil or sweat glands, infection of hair follicles, or a minor injury to the skin. As the body tries to fight the infection, pus collects in the area and creates pressure under the skin. This pressure causes pain. People with weakened immune systems have difficulty fighting infections and get certain abscesses more often.  SYMPTOMS Usually an abscess develops on the skin and becomes a painful mass that is red, warm, and tender. If the abscess forms under the skin, you may feel a moveable soft area under the skin. Some abscesses break open (rupture) on their own, but most will continue to get worse without care. The infection can spread deeper into the body and eventually into the bloodstream, causing you to feel ill.  DIAGNOSIS  Your caregiver will take your medical history and perform a physical exam. A sample of fluid may also be taken from the abscess to determine what is causing your infection. TREATMENT  Your caregiver may prescribe antibiotic medicines to fight the infection. However, taking antibiotics alone usually does not cure an abscess. Your caregiver may need to make a small cut (incision) in the abscess to drain the pus. In some cases, gauze is packed into the abscess to reduce pain and to continue draining the area. HOME CARE INSTRUCTIONS   Only take over-the-counter or prescription medicines for pain, discomfort, or fever as directed by your caregiver.  If you were  prescribed antibiotics, take them as directed. Finish them even if you start to feel better.  If gauze is used, follow your caregiver's directions for changing the gauze.  To avoid spreading the infection:  Keep your draining abscess covered with a bandage.  Wash your hands well.  Do not share personal care items, towels, or whirlpools with others.  Avoid skin contact with others.  Keep your skin and clothes clean around the abscess.  Keep all follow-up appointments as directed by your caregiver. SEEK MEDICAL CARE IF:   You have increased pain, swelling, redness, fluid drainage, or bleeding.  You have muscle aches, chills, or a general ill feeling.  You have a fever. MAKE SURE YOU:   Understand these instructions.  Will watch your condition.  Will get help right away if you are not doing well or get worse.   This information is not intended to replace advice given to you by your health care provider. Make sure you discuss any questions you have with your health care provider.   Document Released: 02/15/2005 Document Revised: 11/07/2011 Document Reviewed: 07/21/2011 Elsevier Interactive Patient Education 2016 Elsevier Inc.  Incision and Drainage Incision and drainage is a procedure in which a sac-like structure (cystic structure) is opened and drained. The area to be drained usually contains material such as pus, fluid, or blood.  LET YOUR CAREGIVER KNOW ABOUT:   Allergies to medicine.  Medicines taken, including vitamins, herbs, eyedrops, over-the-counter medicines, and creams.  Use of steroids (by mouth or creams).  Previous problems with  anesthetics or numbing medicines.  History of bleeding problems or blood clots.  Previous surgery.  Other health problems, including diabetes and kidney problems.  Possibility of pregnancy, if this applies. RISKS AND COMPLICATIONS  Pain.  Bleeding.  Scarring.  Infection. BEFORE THE PROCEDURE  You may need to have  an ultrasound or other imaging tests to see how large or deep your cystic structure is. Blood tests may also be used to determine if you have an infection or how severe the infection is. You may need to have a tetanus shot. PROCEDURE  The affected area is cleaned with a cleaning fluid. The cyst area will then be numbed with a medicine (local anesthetic). A small incision will be made in the cystic structure. A syringe or catheter may be used to drain the contents of the cystic structure, or the contents may be squeezed out. The area will then be flushed with a cleansing solution. After cleansing the area, it is often gently packed with a gauze or another wound dressing. Once it is packed, it will be covered with gauze and tape or some other type of wound dressing. AFTER THE PROCEDURE   Often, you will be allowed to go home right after the procedure.  You may be given antibiotic medicine to prevent or heal an infection.  If the area was packed with gauze or some other wound dressing, you will likely need to come back in 1 to 2 days to get it removed.  The area should heal in about 14 days.   This information is not intended to replace advice given to you by your health care provider. Make sure you discuss any questions you have with your health care provider.   Document Released: 11/01/2000 Document Revised: 11/07/2011 Document Reviewed: 07/03/2011 Elsevier Interactive Patient Education [employer] Elsevier Inc.

## 2015-11-29 ENCOUNTER — Telehealth: Payer: Self-pay | Admitting: Family Medicine

## 2015-11-29 DIAGNOSIS — I10 Essential (primary) hypertension: Secondary | ICD-10-CM

## 2015-11-29 MED ORDER — LISINOPRIL 5 MG PO TABS: 5.0000 mg | ORAL_TABLET | Freq: Every day | ORAL | Status: DC

## 2015-11-29 MED FILL — LISINOPRIL 5 MG TABLET: 5 | 30 days supply | Qty: 30 | Fill #0

## 2015-11-29 NOTE — Telephone Encounter (Signed)
Refilled x 30 days - patient must have office visit for more refills.

## 2015-11-29 NOTE — Telephone Encounter (Signed)
Patient needs lisinopril. Please follow up.

## 2015-12-15 ENCOUNTER — Encounter: Payer: Self-pay | Admitting: Family Medicine

## 2015-12-15 ENCOUNTER — Ambulatory Visit: Payer: Medicaid Other | Attending: Family Medicine | Admitting: Family Medicine

## 2015-12-15 VITALS — BP 172/108 | HR 110 | Temp 98.3°F | Ht 71.0 in | Wt 198.0 lb

## 2015-12-15 DIAGNOSIS — E1169 Type 2 diabetes mellitus with other specified complication: Secondary | ICD-10-CM | POA: Diagnosis not present

## 2015-12-15 DIAGNOSIS — Z96642 Presence of left artificial hip joint: Secondary | ICD-10-CM

## 2015-12-15 DIAGNOSIS — E1149 Type 2 diabetes mellitus with other diabetic neurological complication: Secondary | ICD-10-CM

## 2015-12-15 DIAGNOSIS — E1165 Type 2 diabetes mellitus with hyperglycemia: Secondary | ICD-10-CM | POA: Diagnosis not present

## 2015-12-15 DIAGNOSIS — E1140 Type 2 diabetes mellitus with diabetic neuropathy, unspecified: Secondary | ICD-10-CM | POA: Insufficient documentation

## 2015-12-15 DIAGNOSIS — Z9119 Patient's noncompliance with other medical treatment and regimen: Secondary | ICD-10-CM

## 2015-12-15 DIAGNOSIS — Z9889 Other specified postprocedural states: Secondary | ICD-10-CM

## 2015-12-15 DIAGNOSIS — Z91199 Patient's noncompliance with other medical treatment and regimen due to unspecified reason: Secondary | ICD-10-CM

## 2015-12-15 DIAGNOSIS — I10 Essential (primary) hypertension: Secondary | ICD-10-CM | POA: Diagnosis not present

## 2015-12-15 DIAGNOSIS — IMO0002 Reserved for concepts with insufficient information to code with codable children: Secondary | ICD-10-CM

## 2015-12-15 LAB — GLUCOSE, POCT (MANUAL RESULT ENTRY): POC Glucose: 161 mg/dl — AB (ref 70–99)

## 2015-12-15 LAB — POCT GLYCOSYLATED HEMOGLOBIN (HGB A1C): Hemoglobin A1C: 12.9

## 2015-12-15 MED ORDER — LISINOPRIL 5 MG PO TABS: 5.0000 mg | ORAL_TABLET | Freq: Every day | ORAL | 3 refills | Status: DC

## 2015-12-15 MED ORDER — GLIPIZIDE 10 MG PO TABS: 10.0000 mg | ORAL_TABLET | Freq: Two times a day (BID) | ORAL | 3 refills | Status: DC

## 2015-12-15 MED ORDER — NIFEDIPINE ER 90 MG PO TB24: 90.0000 mg | ORAL_TABLET | Freq: Every day | ORAL | 3 refills | Status: DC

## 2015-12-15 MED ORDER — INSULIN GLARGINE 100 UNIT/ML SOLOSTAR PEN: 10.0000 [IU] | PEN_INJECTOR | Freq: Every day | SUBCUTANEOUS | 3 refills | Status: DC

## 2015-12-15 MED ORDER — GABAPENTIN 300 MG PO CAPS
300.0000 mg | ORAL_CAPSULE | Freq: Two times a day (BID) | ORAL | 3 refills | Status: DC
Start: 2015-12-15 — End: 2016-03-13

## 2015-12-15 MED ORDER — ACCU-CHEK SOFTCLIX LANCETS MISC: 11 refills | Status: DC

## 2015-12-15 MED FILL — GABAPENTIN 300 MG CAPSULE: 300 | 30 days supply | Qty: 60 | Fill #0

## 2015-12-15 MED FILL — NIFEDIPINE ER 90 MG TABLET: 90 | 30 days supply | Qty: 30 | Fill #0

## 2015-12-15 MED FILL — ACCU-CHEK SOFTCLIX LANCETS: 25 days supply | Qty: 100 | Fill #0

## 2015-12-15 MED FILL — glipiZIDE 10 MG TABS: 10 | 30 days supply | Qty: 60 | Fill #0

## 2015-12-15 MED FILL — LANTUS SOLOSTAR 100 UNITS/M: 100 | 30 days supply | Qty: 3 | Fill #0

## 2015-12-15 NOTE — Progress Notes (Signed)
Subjective:  Patient ID: Marc [name], male    DOB: [birth year]  Age: 55 y.o. MRN: [id number]  CC: Diabetes; Hypertension; and Insect Bite   HPI Marc [name] is a 55 y.o. 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  He comes in today for medical clearance for left hip replacement (by Dr Alvan Dame) but has been noncompliant with all his medications and was last seen here in the clinic 9 months ago.  His blood pressure is severely elevated his A1c is 12.9; he endorses dietary indiscretion and noncompliance with an exercise regimen.  He was seen at the ED 3 weeks ago for spider bite to his right leg with drainage of abscess and placement of an antibiotic which he has completed. He has no other concerns today.    Outpatient Medications Prior to Visit  Medication Sig Dispense Refill  . glipiZIDE (GLUCOTROL) 10 MG tablet Take 1 tablet (10 mg total) by mouth 2 (two) times daily before a meal. 60 tablet 3  . lisinopril (PRINIVIL,ZESTRIL) 5 MG tablet Take 1 tablet (5 mg total) by mouth daily. Must have office visit for refills 30 tablet 0  . Blood Glucose Monitoring Suppl (ACCU-CHEK AVIVA PLUS) w/Device KIT USE AS DIRECTED (Patient not taking: Reported on 12/15/2015) 1 kit 0  . ferrous sulfate 325 (65 FE) MG tablet Take 1 tablet (325 mg total) by mouth 3 (three) times daily after meals. (Patient not taking: Reported on 12/15/2015) 90 tablet 3  . glucose blood (ACCU-CHEK AVIVA PLUS) test strip Use as instructed (Patient not taking:  Reported on 12/15/2015) 100 each 12  . traMADol (ULTRAM) 50 MG tablet TAKE 1 TABLET BY MOUTH EVERY 8 HOURS AS NEEDED (Patient not taking: Reported on 12/15/2015) 30 tablet 0  . ACCU-CHEK SOFTCLIX LANCETS lancets Use as instructed (Patient not taking: Reported on 12/15/2015) 100 each 11  . NIFEdipine (ADALAT CC) 90 MG 24 hr tablet Take 1 tablet (90 mg total) by mouth daily. (Patient not taking: Reported on 12/15/2015) 30 tablet 2   No facility-administered medications prior to visit.     ROS Review of Systems  Constitutional: Negative for activity change and appetite change.  HENT: Negative for sinus pressure and sore throat.   Eyes: Negative for visual disturbance.  Respiratory: Negative for cough, chest tightness and shortness of breath.   Cardiovascular: Negative for chest pain and leg swelling.  Gastrointestinal: Negative for abdominal distention, abdominal pain, constipation and diarrhea.  Endocrine: Negative.   Genitourinary: Negative for dysuria.  Musculoskeletal: Negative for joint swelling and myalgias.  Skin: Positive for rash.  Allergic/Immunologic: Negative.   Neurological: Positive for numbness. Negative for weakness and light-headedness.  Psychiatric/Behavioral: Negative for dysphoric mood and suicidal ideas.    Objective:  BP (!) 172/108 (BP Location: Left Arm, Cuff Size: Large)   Pulse (!) 110   Temp 98.3 F (36.8 C) (Oral)   Ht 5' 11"  (1.803 m)   Wt 198 lb (89.8 kg)   SpO2 100%   BMI 27.62 kg/m   BP/Weight 12/15/2015 11/26/2015 6/81/2751  Systolic BP 700 174 944  Diastolic BP 967 591  95  Wt. (Lbs) 198 - 198  BMI 27.62 - 27.63      Physical Exam  Constitutional: He is oriented to person, place, and time. He appears well-developed and well-nourished.  Cardiovascular: Normal heart sounds and intact distal pulses.  Tachycardia present.   No murmur heard. Pulmonary/Chest: Effort normal and breath sounds normal. He has no wheezes. He has no rales. He exhibits no  tenderness.  Abdominal: Soft. Bowel sounds are normal. He exhibits no distension and no mass. There is no tenderness.  Musculoskeletal: Normal range of motion.  Neurological: He is alert and oriented to person, place, and time.  Skin: Rash (Abscess of right Calf with resolved edema, surrounding excoriation of skin and superficial scabs) noted.  Psychiatric: He has a normal mood and affect.     Assessment & Plan:   1. Uncontrolled type 2 diabetes mellitus with other specified complication (HCC) V7Q of 12.9 due to noncompliance Lantus added to her regimen I will review blood sugar log at next visit - Glucose (CBG) - HgB A1c - glipiZIDE (GLUCOTROL) 10 MG tablet; Take 1 tablet (10 mg total) by mouth 2 (two) times daily before a meal.  Dispense: 60 tablet; Refill: 3 - Lipid panel; Future - COMPLETE METABOLIC PANEL WITH GFR; Future - Microalbumin / creatinine urine ratio; Future - Insulin Glargine (LANTUS SOLOSTAR) 100 UNIT/ML Solostar Pen; Inject 10 Units into the skin daily at 10 pm.  Dispense: 5 pen; Refill: 3 - ACCU-CHEK SOFTCLIX LANCETS lancets; Use as instructed  Dispense: 100 each; Refill: 11 - Ambulatory referral to Ophthalmology - Ambulatory referral to Podiatry  2. Essential hypertension Uncontrolled due to noncompliance Advised on low-sodium, DASH diet Effects of noncompliance discussed and the patient is willing to work on being more compliant - lisinopril (PRINIVIL,ZESTRIL) 5 MG tablet; Take 1 tablet (5 mg total) by mouth daily.  Dispense: 30 tablet; Refill: 3 - NIFEdipine (ADALAT CC) 90 MG 24 hr tablet; Take 1 tablet (90 mg total) by mouth daily.  Dispense: 30 tablet; Refill: 3  3. History of arthroplasty of left hip Medical clearance will be on hold and improvement in blood pressure and glycemic control  4. Non-compliance with treatment Implications of noncompliance have been discussed including death and he is willing to be more compliant  5. Other diabetic  neurological complication associated with type 2 diabetes mellitus (HCC) Side effects of Neurontin discussed. - gabapentin (NEURONTIN) 300 MG capsule; Take 1 capsule (300 mg total) by mouth 2 (two) times daily.  Dispense: 60 capsule; Refill: 3   Meds ordered this encounter  Medications  . glipiZIDE (GLUCOTROL) 10 MG tablet    Sig: Take 1 tablet (10 mg total) by mouth 2 (two) times daily before a meal.    Dispense:  60 tablet    Refill:  3    Discontinue metformin.  Marland Kitchen lisinopril (PRINIVIL,ZESTRIL) 5 MG tablet    Sig: Take 1 tablet (5 mg total) by mouth daily.    Dispense:  30 tablet    Refill:  3  . NIFEdipine (ADALAT CC) 90 MG 24 hr tablet    Sig: Take 1 tablet (90 mg total) by mouth daily.    Dispense:  30 tablet    Refill:  3  . Insulin Glargine (LANTUS SOLOSTAR) 100 UNIT/ML Solostar Pen    Sig: Inject 10 Units into the skin daily at 10 pm.    Dispense:  5 pen    Refill:  3  . ACCU-CHEK SOFTCLIX LANCETS lancets  Sig: Use as instructed    Dispense:  100 each    Refill:  11  . gabapentin (NEURONTIN) 300 MG capsule    Sig: Take 1 capsule (300 mg total) by mouth 2 (two) times daily.    Dispense:  60 capsule    Refill:  3    Follow-up: Return in about 1 month (around 01/15/2016) for Follow-up on diabetes mellitus.   Arnoldo Morale MD

## 2015-12-15 NOTE — Progress Notes (Signed)
BP elevated- started BP meds 3 weeks ago Drank a soda and ate a nutty bar Medical clearance for left sided hip replacement Check spider bite right lower leg Medication and diabetic supplies- refilled

## 2015-12-15 NOTE — Patient Instructions (Signed)
Diabetes Mellitus and Food It is important for you to manage your blood sugar (glucose) level. Your blood glucose level can be greatly affected by what you eat. Eating healthier foods in the appropriate amounts throughout the day at about the same time each day will help you control your blood glucose level. It can also help slow or prevent worsening of your diabetes mellitus. Healthy eating may even help you improve the level of your blood pressure and reach or maintain a healthy weight.  General recommendations for healthful eating and cooking habits include:  Eating meals and snacks regularly. Avoid going long periods of time without eating to lose weight.  Eating a diet that consists mainly of plant-based foods, such as fruits, vegetables, nuts, legumes, and whole grains.  Using low-heat cooking methods, such as baking, instead of high-heat cooking methods, such as deep frying. Work with your dietitian to make sure you understand how to use the Nutrition Facts information on food labels. HOW CAN FOOD AFFECT ME? Carbohydrates Carbohydrates affect your blood glucose level more than any other type of food. Your dietitian will help you determine how many carbohydrates to eat at each meal and teach you how to count carbohydrates. Counting carbohydrates is important to keep your blood glucose at a healthy level, especially if you are using insulin or taking certain medicines for diabetes mellitus. Alcohol Alcohol can cause sudden decreases in blood glucose (hypoglycemia), especially if you use insulin or take certain medicines for diabetes mellitus. Hypoglycemia can be a life-threatening condition. Symptoms of hypoglycemia (sleepiness, dizziness, and disorientation) are similar to symptoms of having too much alcohol.  If your health care provider has given you approval to drink alcohol, do so in moderation and use the following guidelines:  Women should not have more than one drink per day, and men  should not have more than two drinks per day. One drink is equal to:  12 oz of beer.  5 oz of wine.  1 oz of hard liquor.  Do not drink on an empty stomach.  Keep yourself hydrated. Have water, diet soda, or unsweetened iced tea.  Regular soda, juice, and other mixers might contain a lot of carbohydrates and should be counted. WHAT FOODS ARE NOT RECOMMENDED? As you make food choices, it is important to remember that all foods are not the same. Some foods have fewer nutrients per serving than other foods, even though they might have the same number of calories or carbohydrates. It is difficult to get your body what it needs when you eat foods with fewer nutrients. Examples of foods that you should avoid that are high in calories and carbohydrates but low in nutrients include:  Trans fats (most processed foods list trans fats on the Nutrition Facts label).  Regular soda.  Juice.  Candy.  Sweets, such as cake, pie, doughnuts, and cookies.  Fried foods. WHAT FOODS CAN I EAT? Eat nutrient-rich foods, which will nourish your body and keep you healthy. The food you should eat also will depend on several factors, including:  The calories you need.  The medicines you take.  Your weight.  Your blood glucose level.  Your blood pressure level.  Your cholesterol level. You should eat a variety of foods, including:  Protein.  Lean cuts of meat.  Proteins low in saturated fats, such as fish, egg whites, and beans. Avoid processed meats.  Fruits and vegetables.  Fruits and vegetables that may help control blood glucose levels, such as apples, mangoes, and   yams.  Dairy products.  Choose fat-free or low-fat dairy products, such as milk, yogurt, and cheese.  Grains, bread, pasta, and rice.  Choose whole grain products, such as multigrain bread, whole oats, and brown rice. These foods may help control blood pressure.  Fats.  Foods containing healthful fats, such as nuts,  avocado, olive oil, canola oil, and fish. DOES EVERYONE WITH DIABETES MELLITUS HAVE THE SAME MEAL PLAN? Because every person with diabetes mellitus is different, there is not one meal plan that works for everyone. It is very important that you meet with a dietitian who will help you create a meal plan that is just right for you.   This information is not intended to replace advice given to you by your health care provider. Make sure you discuss any questions you have with your health care provider.   Document Released: 02/02/2005 Document Revised: 05/29/2014 Document Reviewed: 04/04/2013 Elsevier Interactive Patient Education 2016 Elsevier Inc.  

## 2015-12-16 ENCOUNTER — Ambulatory Visit: Payer: Medicaid Other | Attending: Internal Medicine

## 2015-12-16 DIAGNOSIS — E1165 Type 2 diabetes mellitus with hyperglycemia: Secondary | ICD-10-CM | POA: Diagnosis present

## 2015-12-16 DIAGNOSIS — E1169 Type 2 diabetes mellitus with other specified complication: Secondary | ICD-10-CM

## 2015-12-16 DIAGNOSIS — R58 Hemorrhage, not elsewhere classified: Secondary | ICD-10-CM

## 2015-12-16 DIAGNOSIS — IMO0002 Reserved for concepts with insufficient information to code with codable children: Secondary | ICD-10-CM

## 2015-12-17 ENCOUNTER — Other Ambulatory Visit: Payer: Self-pay | Admitting: Family Medicine

## 2015-12-17 DIAGNOSIS — E785 Hyperlipidemia, unspecified: Secondary | ICD-10-CM | POA: Insufficient documentation

## 2015-12-17 LAB — COMPLETE METABOLIC PANEL WITH GFR
ALBUMIN: 4.4 g/dL (ref 3.6–5.1)
ALK PHOS: 134 U/L — AB (ref 40–115)
ALT: 20 U/L (ref 9–46)
AST: 18 U/L (ref 10–35)
BUN: 19 mg/dL (ref 7–25)
CALCIUM: 9.9 mg/dL (ref 8.6–10.3)
CHLORIDE: 103 mmol/L (ref 98–110)
CO2: 27 mmol/L (ref 20–31)
Creat: 1.91 mg/dL — ABNORMAL HIGH (ref 0.70–1.33)
GFR, EST NON AFRICAN AMERICAN: 39 mL/min — AB (ref >=60)
GFR, Est African American: 45 mL/min — ABNORMAL LOW (ref >=60)
Glucose, Bld: 222 mg/dL — ABNORMAL HIGH (ref 65–99)
POTASSIUM: 5.2 mmol/L (ref 3.5–5.3)
SODIUM: 139 mmol/L (ref 135–146)
Total Bilirubin: 0.3 mg/dL (ref 0.2–1.2)
Total Protein: 7.6 g/dL (ref 6.1–8.1)

## 2015-12-17 LAB — LIPID PANEL
CHOL/HDL RATIO: 6.9 ratio — AB (ref <=5.0)
Cholesterol: 229 mg/dL — ABNORMAL HIGH (ref 125–200)
HDL: 33 mg/dL — AB (ref >=40)
LDL Cholesterol: 126 mg/dL (ref <130)
Triglycerides: 349 mg/dL — ABNORMAL HIGH (ref <150)
VLDL: 70 mg/dL — AB (ref <30)

## 2015-12-17 LAB — MICROALBUMIN / CREATININE URINE RATIO
CREATININE, URINE: 205 mg/dL (ref 20–370)
MICROALB/CREAT RATIO: 24 ug/mg{creat} (ref <30)
Microalb, Ur: 5 mg/dL

## 2015-12-17 MED ORDER — ATORVASTATIN CALCIUM 20 MG PO TABS
20.0000 mg | ORAL_TABLET | Freq: Every day | ORAL | 3 refills | Status: DC
Start: 2015-12-17 — End: 2016-06-01

## 2015-12-22 ENCOUNTER — Telehealth: Payer: Self-pay

## 2015-12-22 NOTE — Telephone Encounter (Signed)
Contacted pt to go over lab results pt did not answer lvm for pt to give me a call at his earliest convenience  

## 2015-12-23 ENCOUNTER — Telehealth: Payer: Self-pay | Admitting: Family Medicine

## 2015-12-23 ENCOUNTER — Telehealth: Payer: Self-pay

## 2015-12-23 NOTE — Telephone Encounter (Signed)
Contacted pt to go over lab results pt did not answer lvm asking pt to give me a call at his earliest convenience and I will be mailing results out today

## 2015-12-23 NOTE — Telephone Encounter (Signed)
Pt. Returned call. Please f/u °

## 2015-12-24 ENCOUNTER — Telehealth: Payer: Self-pay

## 2015-12-24 NOTE — Telephone Encounter (Signed)
-----   Message from Jaclyn Shaggy, MD sent at 12/17/2015  5:48 PM EDT ----- Cholesterol is elevated and so I have placed him on atorvastatin. Kidney functions shows a slight decline; advised increased fluid intake and avoid NSAIDs.

## 2015-12-24 NOTE — Telephone Encounter (Signed)
Patient called Clinical research associate back. Labs were discussed per Dr. Jen Mow recommendation patient is to start lipitor and also increase fluid intake as well as avoid nsaid's  Patient stated understanding.

## 2015-12-24 NOTE — Telephone Encounter (Signed)
-----   Message from Enobong Amao, MD sent at 12/17/2015  5:48 PM EDT ----- Cholesterol is elevated and so I have placed him on atorvastatin. Kidney functions shows a slight decline; advised increased fluid intake and avoid NSAIDs. 

## 2015-12-24 NOTE — Telephone Encounter (Signed)
Writer called patient per Dr. Venetia Night to discuss lab results.  LVM asking patient to return call to number provided.

## 2016-01-13 ENCOUNTER — Ambulatory Visit: Payer: Medicaid Other | Attending: Family Medicine | Admitting: Family Medicine

## 2016-01-13 ENCOUNTER — Encounter: Payer: Self-pay | Admitting: Family Medicine

## 2016-01-13 VITALS — BP 161/83 | HR 98 | Temp 98.5°F | Ht 71.0 in | Wt 201.6 lb

## 2016-01-13 DIAGNOSIS — I110 Hypertensive heart disease with heart failure: Secondary | ICD-10-CM | POA: Diagnosis not present

## 2016-01-13 DIAGNOSIS — E1169 Type 2 diabetes mellitus with other specified complication: Secondary | ICD-10-CM

## 2016-01-13 DIAGNOSIS — N289 Disorder of kidney and ureter, unspecified: Secondary | ICD-10-CM | POA: Diagnosis not present

## 2016-01-13 DIAGNOSIS — Z96642 Presence of left artificial hip joint: Secondary | ICD-10-CM | POA: Insufficient documentation

## 2016-01-13 DIAGNOSIS — E1165 Type 2 diabetes mellitus with hyperglycemia: Secondary | ICD-10-CM | POA: Insufficient documentation

## 2016-01-13 DIAGNOSIS — Z794 Long term (current) use of insulin: Secondary | ICD-10-CM | POA: Insufficient documentation

## 2016-01-13 DIAGNOSIS — Z9119 Patient's noncompliance with other medical treatment and regimen: Secondary | ICD-10-CM | POA: Diagnosis not present

## 2016-01-13 DIAGNOSIS — Z8673 Personal history of transient ischemic attack (TIA), and cerebral infarction without residual deficits: Secondary | ICD-10-CM | POA: Diagnosis not present

## 2016-01-13 DIAGNOSIS — Z91199 Patient's noncompliance with other medical treatment and regimen due to unspecified reason: Secondary | ICD-10-CM

## 2016-01-13 DIAGNOSIS — I10 Essential (primary) hypertension: Secondary | ICD-10-CM | POA: Diagnosis not present

## 2016-01-13 DIAGNOSIS — Z8619 Personal history of other infectious and parasitic diseases: Secondary | ICD-10-CM | POA: Insufficient documentation

## 2016-01-13 DIAGNOSIS — IMO0002 Reserved for concepts with insufficient information to code with codable children: Secondary | ICD-10-CM

## 2016-01-13 DIAGNOSIS — E1122 Type 2 diabetes mellitus with diabetic chronic kidney disease: Secondary | ICD-10-CM | POA: Diagnosis not present

## 2016-01-13 DIAGNOSIS — E1149 Type 2 diabetes mellitus with other diabetic neurological complication: Secondary | ICD-10-CM | POA: Insufficient documentation

## 2016-01-13 LAB — GLUCOSE, POCT (MANUAL RESULT ENTRY): POC Glucose: 68 mg/dl — AB (ref 70–99)

## 2016-01-13 MED ORDER — LISINOPRIL 10 MG PO TABS: 10.0000 mg | ORAL_TABLET | Freq: Every day | ORAL | 3 refills | Status: DC

## 2016-01-13 MED ORDER — ACCU-CHEK SOFTCLIX LANCETS MISC: 11 refills | Status: DC

## 2016-01-13 MED ORDER — LISINOPRIL 5 MG PO TABS: 10.0000 mg | ORAL_TABLET | Freq: Every day | ORAL | 3 refills | Status: DC

## 2016-01-13 NOTE — Patient Instructions (Signed)
Diabetes Mellitus and Food It is important for you to manage your blood sugar (glucose) level. Your blood glucose level can be greatly affected by what you eat. Eating healthier foods in the appropriate amounts throughout the day at about the same time each day will help you control your blood glucose level. It can also help slow or prevent worsening of your diabetes mellitus. Healthy eating may even help you improve the level of your blood pressure and reach or maintain a healthy weight.  General recommendations for healthful eating and cooking habits include:  Eating meals and snacks regularly. Avoid going long periods of time without eating to lose weight.  Eating a diet that consists mainly of plant-based foods, such as fruits, vegetables, nuts, legumes, and whole grains.  Using low-heat cooking methods, such as baking, instead of high-heat cooking methods, such as deep frying. Work with your dietitian to make sure you understand how to use the Nutrition Facts information on food labels. HOW CAN FOOD AFFECT ME? Carbohydrates Carbohydrates affect your blood glucose level more than any other type of food. Your dietitian will help you determine how many carbohydrates to eat at each meal and teach you how to count carbohydrates. Counting carbohydrates is important to keep your blood glucose at a healthy level, especially if you are using insulin or taking certain medicines for diabetes mellitus. Alcohol Alcohol can cause sudden decreases in blood glucose (hypoglycemia), especially if you use insulin or take certain medicines for diabetes mellitus. Hypoglycemia can be a life-threatening condition. Symptoms of hypoglycemia (sleepiness, dizziness, and disorientation) are similar to symptoms of having too much alcohol.  If your health care provider has given you approval to drink alcohol, do so in moderation and use the following guidelines:  Women should not have more than one drink per day, and men  should not have more than two drinks per day. One drink is equal to:  12 oz of beer.  5 oz of wine.  1 oz of hard liquor.  Do not drink on an empty stomach.  Keep yourself hydrated. Have water, diet soda, or unsweetened iced tea.  Regular soda, juice, and other mixers might contain a lot of carbohydrates and should be counted. WHAT FOODS ARE NOT RECOMMENDED? As you make food choices, it is important to remember that all foods are not the same. Some foods have fewer nutrients per serving than other foods, even though they might have the same number of calories or carbohydrates. It is difficult to get your body what it needs when you eat foods with fewer nutrients. Examples of foods that you should avoid that are high in calories and carbohydrates but low in nutrients include:  Trans fats (most processed foods list trans fats on the Nutrition Facts label).  Regular soda.  Juice.  Candy.  Sweets, such as cake, pie, doughnuts, and cookies.  Fried foods. WHAT FOODS CAN I EAT? Eat nutrient-rich foods, which will nourish your body and keep you healthy. The food you should eat also will depend on several factors, including:  The calories you need.  The medicines you take.  Your weight.  Your blood glucose level.  Your blood pressure level.  Your cholesterol level. You should eat a variety of foods, including:  Protein.  Lean cuts of meat.  Proteins low in saturated fats, such as fish, egg whites, and beans. Avoid processed meats.  Fruits and vegetables.  Fruits and vegetables that may help control blood glucose levels, such as apples, mangoes, and   yams.  Dairy products.  Choose fat-free or low-fat dairy products, such as milk, yogurt, and cheese.  Grains, bread, pasta, and rice.  Choose whole grain products, such as multigrain bread, whole oats, and brown rice. These foods may help control blood pressure.  Fats.  Foods containing healthful fats, such as nuts,  avocado, olive oil, canola oil, and fish. DOES EVERYONE WITH DIABETES MELLITUS HAVE THE SAME MEAL PLAN? Because every person with diabetes mellitus is different, there is not one meal plan that works for everyone. It is very important that you meet with a dietitian who will help you create a meal plan that is just right for you.   This information is not intended to replace advice given to you by your health care provider. Make sure you discuss any questions you have with your health care provider.   Document Released: 02/02/2005 Document Revised: 05/29/2014 Document Reviewed: 04/04/2013 Elsevier Interactive Patient Education 2016 Elsevier Inc.  

## 2016-01-13 NOTE — Progress Notes (Signed)
Need lancets and strips Left hip sore Needs education on his diabetes- not taking lantus Not sure what he is taking

## 2016-01-13 NOTE — Progress Notes (Signed)
Subjective:    Patient ID: Marc [name], male    DOB: 02/22/1961, 55 y.o.   MRN: [id number]  HPI Marc [name] is a 55 y.o. 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  He comes in today for follow-up on uncontrolled diabetes mellitus as medical clearance for left hip replacement (by Dr Alvan Dame) has been on hold due to poor glycemic control.  His blood pressure is elevated his A1c is 12.9; he endorses dietary indiscretion and noncompliance with an exercise regimen. He is unsure of the  antihypertensives he takes.  He was commenced on 10 units of Lantus at his last visit which he is not taking. He continues to have L hip pain.  Past Medical History:  Diagnosis Date  . Acute osteomyelitis of femur ([place]) 01/19/2015  . Diabetes mellitus without complication ([place])   . Endocarditis   . Hypertension   . 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 Stroke [facility])     Past Surgical History:  Procedure Laterality Date  . NO PAST SURGERIES    . TOTAL HIP REVISION Left 12/14/2014   Procedure: Left hip resection with prostalac ;  Surgeon: Paralee Cancel, MD;  Location: [facility] ORS;  Service: Orthopedics;  Laterality: Left;  Marland Kitchen VIDEO BRONCHOSCOPY Bilateral 12/03/2014   Procedure: VIDEO BRONCHOSCOPY WITHOUT FLUORO;  Surgeon: Chesley Mires, MD;  Location: [facility] ENDOSCOPY;  Service: Cardiopulmonary;  Laterality: Bilateral;    No Known Allergies  Current Outpatient Prescriptions on File Prior to Visit  Medication Sig Dispense  Refill  . Blood Glucose Monitoring Suppl (ACCU-CHEK AVIVA PLUS) w/Device KIT USE AS DIRECTED 1 kit 0  . gabapentin (NEURONTIN) 300 MG capsule Take 1 capsule (300 mg total) by mouth 2 (two) times daily. 60 capsule 3  . glipiZIDE (GLUCOTROL) 10 MG tablet Take 1 tablet (10 mg total) by mouth 2 (two) times daily before a meal. 60 tablet 3  . glucose blood (ACCU-CHEK AVIVA PLUS) test strip Use as instructed 100 each 12  . atorvastatin (LIPITOR) 20 MG tablet Take 1 tablet (20 mg total) by mouth daily. (Patient not taking: Reported on 01/13/2016) 30 tablet 3  . ferrous sulfate 325 (65 FE) MG tablet Take 1 tablet (325 mg total) by mouth 3 (three) times daily after meals. (Patient not taking: Reported on 12/15/2015) 90 tablet 3  . Insulin Glargine (LANTUS SOLOSTAR) 100 UNIT/ML Solostar Pen Inject 10 Units into the skin daily at 10 pm. (Patient not taking: Reported on 01/13/2016) 5 pen 3  . NIFEdipine (ADALAT CC) 90 MG 24 hr tablet Take 1 tablet (90 mg total) by mouth daily. (Patient not taking: Reported on 01/13/2016) 30 tablet 3  . traMADol (ULTRAM) 50 MG tablet TAKE 1 TABLET BY MOUTH EVERY 8 HOURS AS NEEDED (Patient not taking: Reported on 12/15/2015) 30 tablet 0   No current facility-administered medications on file prior to visit.       Review of Systems HENT: Negative for sinus pressure and sore throat.   Eyes: Negative for visual disturbance.  Respiratory: Negative for cough, chest tightness and shortness of breath.   Cardiovascular:  Negative for chest pain and leg swelling.  Gastrointestinal: Negative for abdominal distention, abdominal pain, constipation and diarrhea.  Endocrine: Negative.   Genitourinary: Negative for dysuria.  Musculoskeletal: L hip pain Skin: negative  Allergic/Immunologic: Negative.   Neurological: Positive for numbness. Negative for weakness and light-headedness.  Psychiatric/Behavioral: Negative for dysphoric mood and suicidal ideas.     Objective: Vitals:   01/13/16  1007  BP: (!) 161/83  Pulse: 98  Temp: 98.5 F (36.9 C)  TempSrc: Oral  SpO2: 99%  Weight: 201 lb 9.6 oz (91.4 kg)  Height: 5' 11"  (1.803 m)      Physical Exam Constitutional: He is oriented to person, place, and time. He appears well-developed and well-nourished.  Cardiovascular: Normal heart sounds and intact distal pulses.   No murmur heard. Pulmonary/Chest: Effort normal and breath sounds normal. He has no wheezes. He has no rales. He exhibits no tenderness.  Abdominal: Soft. Bowel sounds are normal. He exhibits no distension and no mass. There is no tenderness.  Musculoskeletal: Tenderness in L hip on range of motion Neurological: He is alert and oriented to person, place, and time.  Skin: Normal  Psychiatric: He has a normal mood and affect.        Assessment & Plan:  1. Uncontrolled type 2 diabetes mellitus with other specified complication (HCC) Q0H of 12.9 due to noncompliance Not compliant with Lantus either and compliance has been emphasized today I will review blood sugar log at next visit - Glucose (CBG) - HgB A1c - glipiZIDE (GLUCOTROL) 10 MG tablet; Take 1 tablet (10 mg total) by mouth 2 (two) times daily before a meal.  Dispense: 60 tablet; Refill: 3  Insulin Glargine (LANTUS SOLOSTAR) 100 UNIT/ML Solostar Pen; Inject 10 Units into the skin daily at 10 pm.  Dispense: 5 pen; Refill: 3   2. Essential hypertension Uncontrolled due to noncompliance Advised on low-sodium, DASH diet Increase lisinopril from 5 mg to 10 mg Effects of noncompliance discussed and the patient is willing to work on being more compliant - NIFEdipine (ADALAT CC) 90 MG 24 hr tablet; Take 1 tablet (90 mg total) by mouth daily.  Dispense: 30 tablet; Refill: 3  3. History of arthroplasty of left hip Medical clearance will be on hold pending improvement in blood pressure and glycemic control  4. Non-compliance with treatment Implications of noncompliance have been discussed including death  and he is willing to be more compliant  5. Other diabetic neurological complication associated with type 2 diabetes mellitus (HCC) Side effects of Neurontin discussed. - gabapentin (NEURONTIN) 300 MG capsule; Take 1 capsule (300 mg total) by mouth 2 (two) times daily.  Dispense: 60 capsule; Refill: 3  6. 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

## 2016-01-17 ENCOUNTER — Telehealth: Payer: Self-pay

## 2016-01-17 ENCOUNTER — Other Ambulatory Visit: Payer: Self-pay | Admitting: Family Medicine

## 2016-01-17 DIAGNOSIS — E1169 Type 2 diabetes mellitus with other specified complication: Principal | ICD-10-CM

## 2016-01-17 DIAGNOSIS — E1165 Type 2 diabetes mellitus with hyperglycemia: Secondary | ICD-10-CM

## 2016-01-17 DIAGNOSIS — IMO0002 Reserved for concepts with insufficient information to code with codable children: Secondary | ICD-10-CM

## 2016-01-17 NOTE — Telephone Encounter (Signed)
Referral received from Dr Venetia NightAmao requesting home health nursing for the patient.  Call placed to him at # [phone] (M) to inquire if he has a preference for a home health agency  and a HIPAA compliant voicemail message was left requesting a call back to # [phone] or [phone].

## 2016-01-18 ENCOUNTER — Telehealth: Payer: Self-pay

## 2016-01-18 NOTE — Telephone Encounter (Signed)
Attempted to contact the patient to inquire if he has a preference for a home health agency as Dr Venetia NightAmao has requested a home health referral for RN visit for medication management.  Call placed to # [phone] (M) and a HIPAA compliant voicemail message was left requesting a call back to # [phone] or [phone].

## 2016-01-19 ENCOUNTER — Telehealth: Payer: Self-pay

## 2016-01-19 NOTE — Telephone Encounter (Signed)
This Case Manager placed call to patient to determine if he has a home health agency preference as Dr. Venetia NightAmao has ordered home health RN services for medication management. Call placed to #[phone] to discuss; however, unable to reach patient. HIPPA compliant voicemail left requesting return call.

## 2016-01-20 ENCOUNTER — Telehealth: Payer: Self-pay

## 2016-01-20 NOTE — Telephone Encounter (Signed)
This Case Manager placed an additional call to patient to discuss home health RN services and to determine if he has a home health agency preference as Dr. Venetia NightAmao ordered home health RN services for medication management. Call placed to #[phone]; unable to reach patient.  HIPPA compliant voicemail left requesting return call.

## 2016-01-21 ENCOUNTER — Telehealth: Payer: Self-pay

## 2016-01-21 NOTE — Telephone Encounter (Signed)
Patient returned call to this Case Manager. Informed patient that Dr. Venetia NightAmao thinks [facility]. [facility]. Inquired if patient has a home health agency preference. Patient indicated he did not have a home health agency preference. Reviewed patient's address and phone number and demographics updated with patient's current address and phone number.   This Case Manager placed call to [facility] and spoke with Toni Amendourtney regarding patient's home health needs. Toni AmendCourtney indicated referral should be faxed to [facility] for review so they can determine if able to accept. Referral faxed to [facility] at [phone] as requested. Waiting to determine if [facility] able to accept.

## 2016-01-25 ENCOUNTER — Telehealth: Payer: Self-pay

## 2016-01-25 NOTE — Telephone Encounter (Signed)
Call placed to [facility] # [phone] to inquire if they were able to accept the referral for home health services. Spoke to ChipleyNikki who stated that they are not able to accept the referral.

## 2016-01-26 ENCOUNTER — Telehealth: Payer: Self-pay

## 2016-01-26 ENCOUNTER — Ambulatory Visit: Payer: Medicaid Other | Admitting: Family Medicine

## 2016-01-26 NOTE — Telephone Encounter (Addendum)
Call placed to Marc RumpfMary [name] with [facility] to discuss [facility]. Unable to reach; voicemail left requesting return call. In addition, call placed to [facility] Intake office and spoke with Marc [name] about [facility]. She indicated she would check to see if able to provide services in patient's zipcode. Awaiting return call.  Addendum-Received return call from Marc [name] with [facility] who indicated agency is able to accept referral. She indicated patient's start of care will be on 01/27/16 or 01/28/16. Call placed to patient at  #[phone] and [phone] to provide update; however, unable to reach patient. HIPPA compliant voicemails left requesting return call.

## 2016-01-31 ENCOUNTER — Telehealth: Payer: Self-pay | Admitting: Family Medicine

## 2016-01-31 NOTE — Telephone Encounter (Signed)
Pam from [facility] states she was supposed to start seeing the pt last week but has not been able to get in touch with pt  States she has left voicemails and has not received any response

## 2016-02-10 ENCOUNTER — Ambulatory Visit: Payer: Medicaid Other | Attending: Family Medicine | Admitting: Family Medicine

## 2016-02-10 ENCOUNTER — Encounter: Payer: Self-pay | Admitting: Family Medicine

## 2016-02-10 VITALS — BP 150/95 | HR 114 | Temp 97.8°F | Wt 205.4 lb

## 2016-02-10 DIAGNOSIS — I129 Hypertensive chronic kidney disease with stage 1 through stage 4 chronic kidney disease, or unspecified chronic kidney disease: Secondary | ICD-10-CM | POA: Insufficient documentation

## 2016-02-10 DIAGNOSIS — E785 Hyperlipidemia, unspecified: Secondary | ICD-10-CM

## 2016-02-10 DIAGNOSIS — E1165 Type 2 diabetes mellitus with hyperglycemia: Secondary | ICD-10-CM | POA: Diagnosis not present

## 2016-02-10 DIAGNOSIS — IMO0002 Reserved for concepts with insufficient information to code with codable children: Secondary | ICD-10-CM

## 2016-02-10 DIAGNOSIS — Z8614 Personal history of Methicillin resistant Staphylococcus aureus infection: Secondary | ICD-10-CM | POA: Insufficient documentation

## 2016-02-10 DIAGNOSIS — Z794 Long term (current) use of insulin: Secondary | ICD-10-CM | POA: Insufficient documentation

## 2016-02-10 DIAGNOSIS — E1122 Type 2 diabetes mellitus with diabetic chronic kidney disease: Secondary | ICD-10-CM | POA: Insufficient documentation

## 2016-02-10 DIAGNOSIS — E1169 Type 2 diabetes mellitus with other specified complication: Secondary | ICD-10-CM | POA: Diagnosis not present

## 2016-02-10 DIAGNOSIS — Z96642 Presence of left artificial hip joint: Secondary | ICD-10-CM

## 2016-02-10 DIAGNOSIS — Z9114 Patient's other noncompliance with medication regimen: Secondary | ICD-10-CM | POA: Insufficient documentation

## 2016-02-10 DIAGNOSIS — Z8673 Personal history of transient ischemic attack (TIA), and cerebral infarction without residual deficits: Secondary | ICD-10-CM | POA: Insufficient documentation

## 2016-02-10 DIAGNOSIS — Z9889 Other specified postprocedural states: Secondary | ICD-10-CM

## 2016-02-10 DIAGNOSIS — Z9119 Patient's noncompliance with other medical treatment and regimen: Secondary | ICD-10-CM

## 2016-02-10 DIAGNOSIS — Z91199 Patient's noncompliance with other medical treatment and regimen due to unspecified reason: Secondary | ICD-10-CM

## 2016-02-10 LAB — GLUCOSE, POCT (MANUAL RESULT ENTRY)
POC Glucose: 358 mg/dl — AB (ref 70–99)
POC Glucose: 327 mg/dl — AB (ref 70–99)

## 2016-02-10 MED ORDER — INSULIN PEN NEEDLE 31G X 5 MM MISC: 1.0000 | Freq: Every day | 5 refills | Status: DC

## 2016-02-10 MED ORDER — ACCU-CHEK SOFTCLIX LANCETS MISC: 11 refills | Status: DC

## 2016-02-10 MED ORDER — INSULIN ASPART 100 UNIT/ML ~~LOC~~ SOLN
6.0000 [IU] | Freq: Once | SUBCUTANEOUS | Status: AC
  Administered 2016-02-10: 6 [IU] via SUBCUTANEOUS

## 2016-02-10 MED FILL — ACCU-CHEK SOFTCLIX LANCETS: 30 days supply | Qty: 100 | Fill #0 | Status: TO

## 2016-02-10 MED FILL — glipiZIDE 10 MG TABS: 10 | 30 days supply | Qty: 60 | Fill #1 | Status: TO

## 2016-02-10 MED FILL — NIFEDIPINE ER 90 MG TABLET: 90 | 30 days supply | Qty: 30 | Fill #1 | Status: TO

## 2016-02-10 MED FILL — ACCU-CHEK AVIVA PLUS TEST S: 25 days supply | Qty: 100 | Fill #0 | Status: TO

## 2016-02-10 NOTE — Patient Instructions (Signed)
Diabetes Mellitus and Food It is important for you to manage your blood sugar (glucose) level. Your blood glucose level can be greatly affected by what you eat. Eating healthier foods in the appropriate amounts throughout the day at about the same time each day will help you control your blood glucose level. It can also help slow or prevent worsening of your diabetes mellitus. Healthy eating may even help you improve the level of your blood pressure and reach or maintain a healthy weight.  General recommendations for healthful eating and cooking habits include:  Eating meals and snacks regularly. Avoid going long periods of time without eating to lose weight.  Eating a diet that consists mainly of plant-based foods, such as fruits, vegetables, nuts, legumes, and whole grains.  Using low-heat cooking methods, such as baking, instead of high-heat cooking methods, such as deep frying. Work with your dietitian to make sure you understand how to use the Nutrition Facts information on food labels. HOW CAN FOOD AFFECT ME? Carbohydrates Carbohydrates affect your blood glucose level more than any other type of food. Your dietitian will help you determine how many carbohydrates to eat at each meal and teach you how to count carbohydrates. Counting carbohydrates is important to keep your blood glucose at a healthy level, especially if you are using insulin or taking certain medicines for diabetes mellitus. Alcohol Alcohol can cause sudden decreases in blood glucose (hypoglycemia), especially if you use insulin or take certain medicines for diabetes mellitus. Hypoglycemia can be a life-threatening condition. Symptoms of hypoglycemia (sleepiness, dizziness, and disorientation) are similar to symptoms of having too much alcohol.  If your health care provider has given you approval to drink alcohol, do so in moderation and use the following guidelines:  Women should not have more than one drink per day, and men  should not have more than two drinks per day. One drink is equal to:  12 oz of beer.  5 oz of wine.  1 oz of hard liquor.  Do not drink on an empty stomach.  Keep yourself hydrated. Have water, diet soda, or unsweetened iced tea.  Regular soda, juice, and other mixers might contain a lot of carbohydrates and should be counted. WHAT FOODS ARE NOT RECOMMENDED? As you make food choices, it is important to remember that all foods are not the same. Some foods have fewer nutrients per serving than other foods, even though they might have the same number of calories or carbohydrates. It is difficult to get your body what it needs when you eat foods with fewer nutrients. Examples of foods that you should avoid that are high in calories and carbohydrates but low in nutrients include:  Trans fats (most processed foods list trans fats on the Nutrition Facts label).  Regular soda.  Juice.  Candy.  Sweets, such as cake, pie, doughnuts, and cookies.  Fried foods. WHAT FOODS CAN I EAT? Eat nutrient-rich foods, which will nourish your body and keep you healthy. The food you should eat also will depend on several factors, including:  The calories you need.  The medicines you take.  Your weight.  Your blood glucose level.  Your blood pressure level.  Your cholesterol level. You should eat a variety of foods, including:  Protein.  Lean cuts of meat.  Proteins low in saturated fats, such as fish, egg whites, and beans. Avoid processed meats.  Fruits and vegetables.  Fruits and vegetables that may help control blood glucose levels, such as apples, mangoes, and   yams.  Dairy products.  Choose fat-free or low-fat dairy products, such as milk, yogurt, and cheese.  Grains, bread, pasta, and rice.  Choose whole grain products, such as multigrain bread, whole oats, and brown rice. These foods may help control blood pressure.  Fats.  Foods containing healthful fats, such as nuts,  avocado, olive oil, canola oil, and fish. DOES EVERYONE WITH DIABETES MELLITUS HAVE THE SAME MEAL PLAN? Because every person with diabetes mellitus is different, there is not one meal plan that works for everyone. It is very important that you meet with a dietitian who will help you create a meal plan that is just right for you.   This information is not intended to replace advice given to you by your health care provider. Make sure you discuss any questions you have with your health care provider.   Document Released: 02/02/2005 Document Revised: 05/29/2014 Document Reviewed: 04/04/2013 Elsevier Interactive Patient Education 2016 Elsevier Inc.  

## 2016-02-10 NOTE — Progress Notes (Signed)
Subjective:    Patient ID: Marc [name], male    DOB: May 19, 1961, 55 y.o.   MRN: [id number]  HPI Marc [name] is a 55 y.o. male with a history of uncontrolled type 2 diabetes mellitus (A1C 12.9 ), hypertension, noncompliance with medication therapy, right sided endocarditis, MRSA bacteremia with septic emboli to the lung (diagnosis of endocarditis was clinical) who subsequently developed a left septic hip joint with osteomyelitis of the femur (Due to noncompliance with IV antibiotics) and is status post left hip resection arthroplasty, placement of temporary antibiotic cemented total hip arthroplasty from [facility] on 12/15/14.  He comes in today for follow-up on uncontrolled diabetes mellitus as medical clearance for left hip replacement (by Dr Alvan Dame) has been on hold due to poor glycemic control.  He had endorsed  noncompliance with his Lantus at his last visit and had promised to better however today he is still not taking his Lantus and blood sugar is 358 in the clinic. He states he did not have his lancets to check his blood sugar since did not take his Lantus. Also does not have be dependent needles.  Review of his medications indicates he only has glipizide and nifedipine but does not help all other medications. Accompanied by a caseworker from [facility]  Past Medical History:  Diagnosis Date  . Acute osteomyelitis of femur ([place]) 01/19/2015  . Diabetes mellitus without complication ([place])   . Endocarditis   . Hypertension   . 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 Stroke [facility])     Past Surgical History:  Procedure Laterality Date  . NO PAST SURGERIES    . TOTAL HIP REVISION Left 12/14/2014   Procedure: Left hip resection with prostalac ;  Surgeon: Paralee Cancel, MD;  Location[facility];  Service: Orthopedics;  Laterality: Left;  Marland Kitchen VIDEO BRONCHOSCOPY Bilateral 12/03/2014   Procedure: VIDEO BRONCHOSCOPY WITHOUT FLUORO;  Surgeon: Chesley Mires, MD;  Location: [facility]  ENDOSCOPY;  Service: Cardiopulmonary;  Laterality: Bilateral;    No Known Allergies  Current Outpatient Prescriptions on File Prior to Visit  Medication Sig Dispense Refill  . Blood Glucose Monitoring Suppl (ACCU-CHEK AVIVA PLUS) w/Device KIT USE AS DIRECTED 1 kit 0  . glipiZIDE (GLUCOTROL) 10 MG tablet Take 1 tablet (10 mg total) by mouth 2 (two) times daily before a meal. 60 tablet 3  . glucose blood (ACCU-CHEK AVIVA PLUS) test strip Use as instructed 100 each 12  . lisinopril (PRINIVIL,ZESTRIL) 10 MG tablet Take 1 tablet (10 mg total) by mouth daily. 30 tablet 3  . atorvastatin (LIPITOR) 20 MG tablet Take 1 tablet (20 mg total) by mouth daily. (Patient not taking: Reported on 02/10/2016) 30 tablet 3  . ferrous sulfate 325 (65 FE) MG tablet Take 1 tablet (325 mg total) by mouth 3 (three) times daily after meals. (Patient not taking: Reported on 02/10/2016) 90 tablet 3  . gabapentin (NEURONTIN) 300 MG capsule Take 1 capsule (300 mg total) by mouth 2 (two) times daily. 60 capsule 3  . Insulin Glargine (LANTUS SOLOSTAR) 100 UNIT/ML Solostar Pen Inject 10 Units into the skin daily at 10 pm. (Patient not taking: Reported on 02/10/2016) 5 pen 3  . NIFEdipine (ADALAT CC) 90 MG 24 hr tablet Take 1 tablet (90 mg total) by mouth daily. (Patient not taking: Reported on 02/10/2016) 30 tablet 3  . traMADol (ULTRAM) 50 MG tablet TAKE 1 TABLET BY MOUTH EVERY 8 HOURS AS NEEDED (Patient not taking: Reported on 02/10/2016) 30 tablet  0   No current facility-administered medications on file prior to visit.       Review of Systems HENT: Negative for sinus pressure and sore throat.   Eyes: Negative for visual disturbance.  Respiratory: Negative for cough, chest tightness and shortness of breath.   Cardiovascular: Negative for chest pain and leg swelling.  Gastrointestinal: Negative for abdominal distention, abdominal pain, constipation and diarrhea.  Endocrine: Negative.   Genitourinary: Negative for dysuria.    Musculoskeletal: L hip pain Skin: negative  Allergic/Immunologic: Negative.   Neurological: Positive for numbness. Negative for weakness and light-headedness.  Psychiatric/Behavioral: Negative for dysphoric mood and suicidal ideas.     Objective: Vitals:   02/10/16 1154  BP: (!) 150/95  Pulse: (!) 114  Temp: 97.8 F (36.6 C)  TempSrc: Oral  Weight: 205 lb 6.4 oz (93.2 kg)      Physical Exam Constitutional: He is oriented to person, place, and time. He appears well-developed and well-nourished.  Cardiovascular: Tachycardic, Normal heart sounds and intact distal pulses.   No murmur heard. Pulmonary/Chest: Effort normal and breath sounds normal. He has no wheezes. He has no rales. He exhibits no tenderness.  Abdominal: Soft. Bowel sounds are normal. He exhibits no distension and no mass. There is no tenderness.  Musculoskeletal: Tenderness in L hip on range of motion Neurological: He is alert and oriented to person, place, and time.  Skin: Normal  Psychiatric: He has a normal mood and affect.    Lab Results  Component Value Date   HGBA1C 12.9 12/15/2015       Assessment & Plan:  1. Uncontrolled type 2 diabetes mellitus with other specified complication (HCC) A4T of 12.9 due to noncompliance Not compliant taking Lantus either and compliance has been emphasized today Prescription for pen needles have been written as well as prescription for lancets Discussed blood sugar goals of 80-120 fasting and less than 180 random. I will review blood sugar log at next visit - Glucose (CBG) - HgB A1c - glipiZIDE (GLUCOTROL) 10 MG tablet; Take 1 tablet (10 mg total) by mouth 2 (two) times daily before a meal.  Dispense: 60 tablet; Refill: 3  Insulin Glargine (LANTUS SOLOSTAR) 100 UNIT/ML Solostar Pen; Inject 10 Units into the skin daily at 10 pm.  Dispense: 5 pen; Refill: 3   2. Essential hypertension Uncontrolled due to noncompliance Advised on low-sodium, DASH diet He does not  have his lisinopril bottle with him Effects of noncompliance discussed and the patient is willing to work on being more compliant - NIFEdipine (ADALAT CC) 90 MG 24 hr tablet; Take 1 tablet (90 mg total) by mouth daily.  Dispense: 30 tablet; Refill: 3  3. History of arthroplasty of left hip Medical clearance will be on hold pending improvement in blood pressure and glycemic control  4. Non-compliance with treatment Implications of noncompliance have been discussed including death and he is willing to be more compliant  5. Other diabetic neurological complication associated with type 2 diabetes mellitus (HCC) Side effects of Neurontin discussed. - gabapentin (NEURONTIN) 300 MG capsule; Take 1 capsule (300 mg total) by mouth 2 (two) times daily.  Dispense: 60 capsule; Refill: 3  6. 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

## 2016-03-13 ENCOUNTER — Ambulatory Visit: Payer: Medicaid Other | Attending: Family Medicine | Admitting: Family Medicine

## 2016-03-13 ENCOUNTER — Encounter: Payer: Self-pay | Admitting: Family Medicine

## 2016-03-13 VITALS — BP 136/78 | HR 108 | Temp 97.4°F | Ht 71.0 in | Wt 207.8 lb

## 2016-03-13 DIAGNOSIS — E1165 Type 2 diabetes mellitus with hyperglycemia: Secondary | ICD-10-CM | POA: Diagnosis not present

## 2016-03-13 DIAGNOSIS — I10 Essential (primary) hypertension: Secondary | ICD-10-CM | POA: Diagnosis not present

## 2016-03-13 DIAGNOSIS — E0800 Diabetes mellitus due to underlying condition with hyperosmolarity without nonketotic hyperglycemic-hyperosmolar coma (NKHHC): Secondary | ICD-10-CM | POA: Diagnosis not present

## 2016-03-13 DIAGNOSIS — E1149 Type 2 diabetes mellitus with other diabetic neurological complication: Secondary | ICD-10-CM | POA: Diagnosis not present

## 2016-03-13 DIAGNOSIS — Z79899 Other long term (current) drug therapy: Secondary | ICD-10-CM | POA: Diagnosis not present

## 2016-03-13 DIAGNOSIS — Z96643 Presence of artificial hip joint, bilateral: Secondary | ICD-10-CM | POA: Insufficient documentation

## 2016-03-13 DIAGNOSIS — Z1211 Encounter for screening for malignant neoplasm of colon: Secondary | ICD-10-CM

## 2016-03-13 DIAGNOSIS — M009 Pyogenic arthritis, unspecified: Secondary | ICD-10-CM | POA: Insufficient documentation

## 2016-03-13 DIAGNOSIS — Z794 Long term (current) use of insulin: Secondary | ICD-10-CM

## 2016-03-13 DIAGNOSIS — Z8673 Personal history of transient ischemic attack (TIA), and cerebral infarction without residual deficits: Secondary | ICD-10-CM | POA: Insufficient documentation

## 2016-03-13 DIAGNOSIS — Z23 Encounter for immunization: Secondary | ICD-10-CM

## 2016-03-13 DIAGNOSIS — Z9889 Other specified postprocedural states: Secondary | ICD-10-CM | POA: Diagnosis not present

## 2016-03-13 DIAGNOSIS — Z0000 Encounter for general adult medical examination without abnormal findings: Secondary | ICD-10-CM

## 2016-03-13 DIAGNOSIS — Z9114 Patient's other noncompliance with medication regimen: Secondary | ICD-10-CM | POA: Diagnosis not present

## 2016-03-13 DIAGNOSIS — Z9119 Patient's noncompliance with other medical treatment and regimen: Secondary | ICD-10-CM | POA: Diagnosis not present

## 2016-03-13 LAB — GLUCOSE, POCT (MANUAL RESULT ENTRY)
POC GLUCOSE: 365 mg/dL — AB (ref 70–99)
POC Glucose: 403 mg/dl — AB (ref 70–99)

## 2016-03-13 LAB — POCT GLYCOSYLATED HEMOGLOBIN (HGB A1C): Hemoglobin A1C: 11.9

## 2016-03-13 MED ORDER — GABAPENTIN 300 MG PO CAPS: 300.0000 mg | ORAL_CAPSULE | Freq: Two times a day (BID) | ORAL | 3 refills | Status: DC

## 2016-03-13 MED ORDER — INSULIN ASPART 100 UNIT/ML IV SOLN
10.0000 [IU] | Freq: Once | INTRAVENOUS | Status: AC
  Administered 2016-03-13: 10 [IU] via INTRAVENOUS

## 2016-03-13 MED ORDER — INSULIN GLARGINE 100 UNIT/ML SOLOSTAR PEN: 25.0000 [IU] | PEN_INJECTOR | Freq: Every day | SUBCUTANEOUS | 3 refills | Status: DC

## 2016-03-13 MED ORDER — ACCU-CHEK SOFTCLIX LANCETS MISC: 11 refills | Status: DC

## 2016-03-13 NOTE — Patient Instructions (Signed)
Diabetes Mellitus and Food It is important for you to manage your blood sugar (glucose) level. Your blood glucose level can be greatly affected by what you eat. Eating healthier foods in the appropriate amounts throughout the day at about the same time each day will help you control your blood glucose level. It can also help slow or prevent worsening of your diabetes mellitus. Healthy eating may even help you improve the level of your blood pressure and reach or maintain a healthy weight.  General recommendations for healthful eating and cooking habits include:  Eating meals and snacks regularly. Avoid going long periods of time without eating to lose weight.  Eating a diet that consists mainly of plant-based foods, such as fruits, vegetables, nuts, legumes, and whole grains.  Using low-heat cooking methods, such as baking, instead of high-heat cooking methods, such as deep frying. Work with your dietitian to make sure you understand how to use the Nutrition Facts information on food labels. HOW CAN FOOD AFFECT ME? Carbohydrates Carbohydrates affect your blood glucose level more than any other type of food. Your dietitian will help you determine how many carbohydrates to eat at each meal and teach you how to count carbohydrates. Counting carbohydrates is important to keep your blood glucose at a healthy level, especially if you are using insulin or taking certain medicines for diabetes mellitus. Alcohol Alcohol can cause sudden decreases in blood glucose (hypoglycemia), especially if you use insulin or take certain medicines for diabetes mellitus. Hypoglycemia can be a life-threatening condition. Symptoms of hypoglycemia (sleepiness, dizziness, and disorientation) are similar to symptoms of having too much alcohol.  If your health care provider has given you approval to drink alcohol, do so in moderation and use the following guidelines:  Women should not have more than one drink per day, and men  should not have more than two drinks per day. One drink is equal to:  12 oz of beer.  5 oz of wine.  1 oz of hard liquor.  Do not drink on an empty stomach.  Keep yourself hydrated. Have water, diet soda, or unsweetened iced tea.  Regular soda, juice, and other mixers might contain a lot of carbohydrates and should be counted. WHAT FOODS ARE NOT RECOMMENDED? As you make food choices, it is important to remember that all foods are not the same. Some foods have fewer nutrients per serving than other foods, even though they might have the same number of calories or carbohydrates. It is difficult to get your body what it needs when you eat foods with fewer nutrients. Examples of foods that you should avoid that are high in calories and carbohydrates but low in nutrients include:  Trans fats (most processed foods list trans fats on the Nutrition Facts label).  Regular soda.  Juice.  Candy.  Sweets, such as cake, pie, doughnuts, and cookies.  Fried foods. WHAT FOODS CAN I EAT? Eat nutrient-rich foods, which will nourish your body and keep you healthy. The food you should eat also will depend on several factors, including:  The calories you need.  The medicines you take.  Your weight.  Your blood glucose level.  Your blood pressure level.  Your cholesterol level. You should eat a variety of foods, including:  Protein.  Lean cuts of meat.  Proteins low in saturated fats, such as fish, egg whites, and beans. Avoid processed meats.  Fruits and vegetables.  Fruits and vegetables that may help control blood glucose levels, such as apples, mangoes, and   yams.  Dairy products.  Choose fat-free or low-fat dairy products, such as milk, yogurt, and cheese.  Grains, bread, pasta, and rice.  Choose whole grain products, such as multigrain bread, whole oats, and brown rice. These foods may help control blood pressure.  Fats.  Foods containing healthful fats, such as nuts,  avocado, olive oil, canola oil, and fish. DOES EVERYONE WITH DIABETES MELLITUS HAVE THE SAME MEAL PLAN? Because every person with diabetes mellitus is different, there is not one meal plan that works for everyone. It is very important that you meet with a dietitian who will help you create a meal plan that is just right for you.   This information is not intended to replace advice given to you by your health care provider. Make sure you discuss any questions you have with your health care provider.   Document Released: 02/02/2005 Document Revised: 05/29/2014 Document Reviewed: 04/04/2013 Elsevier Interactive Patient Education 2016 Elsevier Inc.  

## 2016-03-13 NOTE — Progress Notes (Signed)
Subjective:  Patient ID: Marc [name], male    DOB: 1960/12/09  Age: 55 y.o. MRN: [id number]  CC: Diabetes and Hypertension   HPI Marc [name] is a 55 y.o. male with a history of uncontrolled type 2 diabetes mellitus (A1C 11.9 ), hypertension, noncompliance with medication therapy, right sided endocarditis, MRSA bacteremia with septic emboli to the lung (diagnosis of endocarditis was clinical) who subsequently developed a left septic hip joint with osteomyelitis of the femur (Due to noncompliance with IV antibiotics) and is status post left hip resection arthroplasty, placement of temporary antibiotic cemented total hip arthroplasty from [facility] on 12/15/14.  He comes in today for follow-up on uncontrolled diabetes mellitus as medical clearance for left hip replacement (by Dr Alvan Dame) has been on hold due to poor glycemic control.  Endorses compliance with 10 units of Lantus but has not been checking his sugars due to lack of lancets, same complaint which he had at his last visit. Not up-to-date on eye exam or foot exam but has not had a colonoscopy.  Has no other concerns today.  Past Medical History:  Diagnosis Date  . Acute osteomyelitis of femur ([place]) 01/19/2015  . Diabetes mellitus without complication ([place])   . Endocarditis   . Hypertension   . 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 Stroke [facility])     Past Surgical History:  Procedure Laterality Date  . NO PAST SURGERIES    . TOTAL HIP REVISION Left 12/14/2014   Procedure: Left hip resection with prostalac ;  Surgeon: Paralee Cancel, MD;  Location: [facility];  Service: Orthopedics;  Laterality: Left;  [facility] VIDEO BRONCHOSCOPY Bilateral 12/03/2014   Procedure: VIDEO BRONCHOSCOPY WITHOUT FLUORO;  Surgeon: Chesley Mires, MD;  Location: [facility];  Service: Cardiopulmonary;  Laterality: Bilateral;     Outpatient Medications Prior to Visit  Medication Sig Dispense Refill  . atorvastatin (LIPITOR) 20 MG tablet Take  1 tablet (20 mg total) by mouth daily. 30 tablet 3  . Blood Glucose Monitoring Suppl (ACCU-CHEK AVIVA PLUS) w/Device KIT USE AS DIRECTED 1 kit 0  . ferrous sulfate 325 (65 FE) MG tablet Take 1 tablet (325 mg total) by mouth 3 (three) times daily after meals. 90 tablet 3  . glipiZIDE (GLUCOTROL) 10 MG tablet Take 1 tablet (10 mg total) by mouth 2 (two) times daily before a meal. 60 tablet 3  . glucose blood (ACCU-CHEK AVIVA PLUS) test strip Use as instructed 100 each 12  . Insulin Pen Needle 31G X 5 MM MISC 1 each by Does not apply route at bedtime. 30 each 5  . lisinopril (PRINIVIL,ZESTRIL) 10 MG tablet Take 1 tablet (10 mg total) by mouth daily. 30 tablet 3  . NIFEdipine (ADALAT CC) 90 MG 24 hr tablet Take 1 tablet (90 mg total) by mouth daily. 30 tablet 3  . traMADol (ULTRAM) 50 MG tablet TAKE 1 TABLET BY MOUTH EVERY 8 HOURS AS NEEDED 30 tablet 0  . ACCU-CHEK SOFTCLIX LANCETS lancets Use as instructed 3 times daily before meals 100 each 11  . Insulin Glargine (LANTUS SOLOSTAR) 100 UNIT/ML Solostar Pen Inject 10 Units into the skin daily at 10 pm. 5 pen 3  . gabapentin (NEURONTIN) 300 MG capsule Take 1 capsule (300 mg total) by mouth 2 (two) times daily. 60 capsule 3   No facility-administered medications prior to visit.     ROS Review of Systems HENT: Negative for sinus pressure and sore throat.   Eyes: Negative for  visual disturbance.  Respiratory: Negative for cough, chest tightness and shortness of breath.   Cardiovascular: Negative for chest pain and leg swelling.  Gastrointestinal: Negative for abdominal distention, abdominal pain, constipation and diarrhea.  Endocrine: Negative.   Genitourinary: Negative for dysuria.  Musculoskeletal: L hip pain Skin: negative  Allergic/Immunologic: Negative.   Neurological:  Negative for weakness and light-headedness.  Psychiatric/Behavioral: Negative for dysphoric mood and suicidal ideas.   Objective:  BP 136/78 (BP Location: Right Arm,  Patient Position: Sitting, Cuff Size: Large)   Pulse (!) 108   Temp 97.4 F (36.3 C) (Oral)   Ht 5' 11"  (1.803 m)   Wt 207 lb 12.8 oz (94.3 kg)   SpO2 98%   BMI 28.98 kg/m   BP/Weight 03/13/2016 02/10/2016 7/84/6962  Systolic BP 952 841 324  Diastolic BP 78 95 83  Wt. (Lbs) 207.8 205.4 201.6  BMI 28.98 28.65 28.12      Physical Exam Constitutional: He is oriented to person, place, and time. He appears well-developed and well-nourished.  Cardiovascular: Tachycardic, Normal heart sounds and intact distal pulses.   No murmur heard. Pulmonary/Chest: Effort normal and breath sounds normal. He has no wheezes. He has no rales. He exhibits no tenderness.  Abdominal: Soft. Bowel sounds are normal. He exhibits no distension and no mass. There is no tenderness.  Musculoskeletal: Tenderness in L hip on range of motion Neurological: He is alert and oriented to person, place, and time.  Skin: Normal  Psychiatric: He has a normal mood and affect.   Assessment & Plan:   1. Diabetes mellitus due to underlying condition, uncontrolled, with hyperosmolarity without coma, with long-term current use of insulin (HCC) Uncontrolled with A1c of 11.9 Increase Lantus to 25 units at bedtime We will review blood sugar log at next visit - Glucose (CBG) - HgB A1c - ACCU-CHEK SOFTCLIX LANCETS lancets; Use as instructed 3 times daily before meals  Dispense: 100 each; Refill: 11 - Insulin Glargine (LANTUS SOLOSTAR) 100 UNIT/ML Solostar Pen; Inject 25 Units into the skin daily at 10 pm.  Dispense: 5 pen; Refill: 3 - Microalbumin / creatinine urine ratio - Ambulatory referral to Ophthalmology - insulin aspart (novoLOG) injection 10 Units; Inject 0.1 mLs (10 Units total) into the vein once.  2. Other diabetic neurological complication associated with type 2 diabetes mellitus (HCC) Controlled - gabapentin (NEURONTIN) 300 MG capsule; Take 1 capsule (300 mg total) by mouth 2 (two) times daily.  Dispense: 60  capsule; Refill: 3 - COMPLETE METABOLIC PANEL WITH GFR  3. Essential hypertension Controlled  4. Need for prophylactic vaccination against Streptococcus pneumoniae (pneumococcus) - Pneumococcal polysaccharide vaccine 23-valent greater than or equal to 2yo subcutaneous/IM  5. Special screening for malignant neoplasms, colon - Ambulatory referral to Gastroenterology  6. Healthcare maintenance - Ambulatory referral to Gastroenterology -Foot exam performed today   Meds ordered this encounter  Medications  . ACCU-CHEK SOFTCLIX LANCETS lancets    Sig: Use as instructed 3 times daily before meals    Dispense:  100 each    Refill:  11  . Insulin Glargine (LANTUS SOLOSTAR) 100 UNIT/ML Solostar Pen    Sig: Inject 25 Units into the skin daily at 10 pm.    Dispense:  5 pen    Refill:  3    Discontinue previous dose  . gabapentin (NEURONTIN) 300 MG capsule    Sig: Take 1 capsule (300 mg total) by mouth 2 (two) times daily.    Dispense:  60 capsule    Refill:  3  . insulin aspart (novoLOG) injection 10 Units    Follow-up: Return in about 1 month (around 04/13/2016) for Follow-up on diabetes mellitus.   Arnoldo Morale MD

## 2016-03-14 ENCOUNTER — Telehealth: Payer: Self-pay | Admitting: Family Medicine

## 2016-03-14 LAB — COMPLETE METABOLIC PANEL WITH GFR
ALT: 18 U/L (ref 9–46)
AST: 15 U/L (ref 10–35)
Albumin: 4.6 g/dL (ref 3.6–5.1)
Alkaline Phosphatase: 128 U/L — ABNORMAL HIGH (ref 40–115)
BUN: 43 mg/dL — AB (ref 7–25)
CHLORIDE: 100 mmol/L (ref 98–110)
CO2: 23 mmol/L (ref 20–31)
Calcium: 9.8 mg/dL (ref 8.6–10.3)
Creat: 2.13 mg/dL — ABNORMAL HIGH (ref 0.70–1.33)
GFR, Est African American: 40 mL/min — ABNORMAL LOW (ref >=60)
GFR, Est Non African American: 34 mL/min — ABNORMAL LOW (ref >=60)
GLUCOSE: 363 mg/dL — AB (ref 65–99)
POTASSIUM: 4.7 mmol/L (ref 3.5–5.3)
SODIUM: 135 mmol/L (ref 135–146)
Total Bilirubin: 0.4 mg/dL (ref 0.2–1.2)
Total Protein: 7.8 g/dL (ref 6.1–8.1)

## 2016-03-14 NOTE — Telephone Encounter (Signed)
Per Charlotte SanesBernetta with [facility] came in today and stated to let patient's PCP know: That patient has been non compliant with care plan and goals "Patient has switched Pharmacy for easier distribution due to pharmacy being out of his town. Patient is really rude but allows me to call him but not to see him. I do not know if he is taking his blood sugar but he did inform me that his AIC has decreased. I will continue to help and will see you at next visit." Please F/U with Mrs. Charlotte SanesBernetta at [phone].

## 2016-03-15 NOTE — Telephone Encounter (Signed)
Noted and discussed at office visit

## 2016-03-16 ENCOUNTER — Telehealth: Payer: Self-pay

## 2016-03-16 NOTE — Telephone Encounter (Signed)
-----   Message from Jaclyn ShaggyEnobong Amao, MD sent at 03/15/2016  2:11 PM EDT ----- Kidney function is worsening due to uncontrolled diabetes. Strongly advised to comply with medications.

## 2016-03-16 NOTE — Telephone Encounter (Signed)
Writer called patient per Dr. Venetia NightAmao.  Patient stated understanding of the results and will try harder to control his diabetes.

## 2016-04-18 ENCOUNTER — Encounter: Payer: Self-pay | Admitting: Family Medicine

## 2016-05-05 ENCOUNTER — Ambulatory Visit: Payer: Medicaid Other | Admitting: Family Medicine

## 2016-05-25 ENCOUNTER — Ambulatory Visit: Payer: Medicaid Other | Admitting: Family Medicine

## 2016-05-30 IMAGING — RF DG FLUORO GUIDE NDL PLC/BX
3 series · 3 of 3 positions shown · non-contrast
Comparison: none

CLINICAL DATA: Left hip pain and history of bacterial endocarditis.
MRI has demonstrated a joint effusion and signal changes in the left
femoral head.

[Series 1: run · 1 of 1 slices shown (1 of 3)]
[im 1/1]
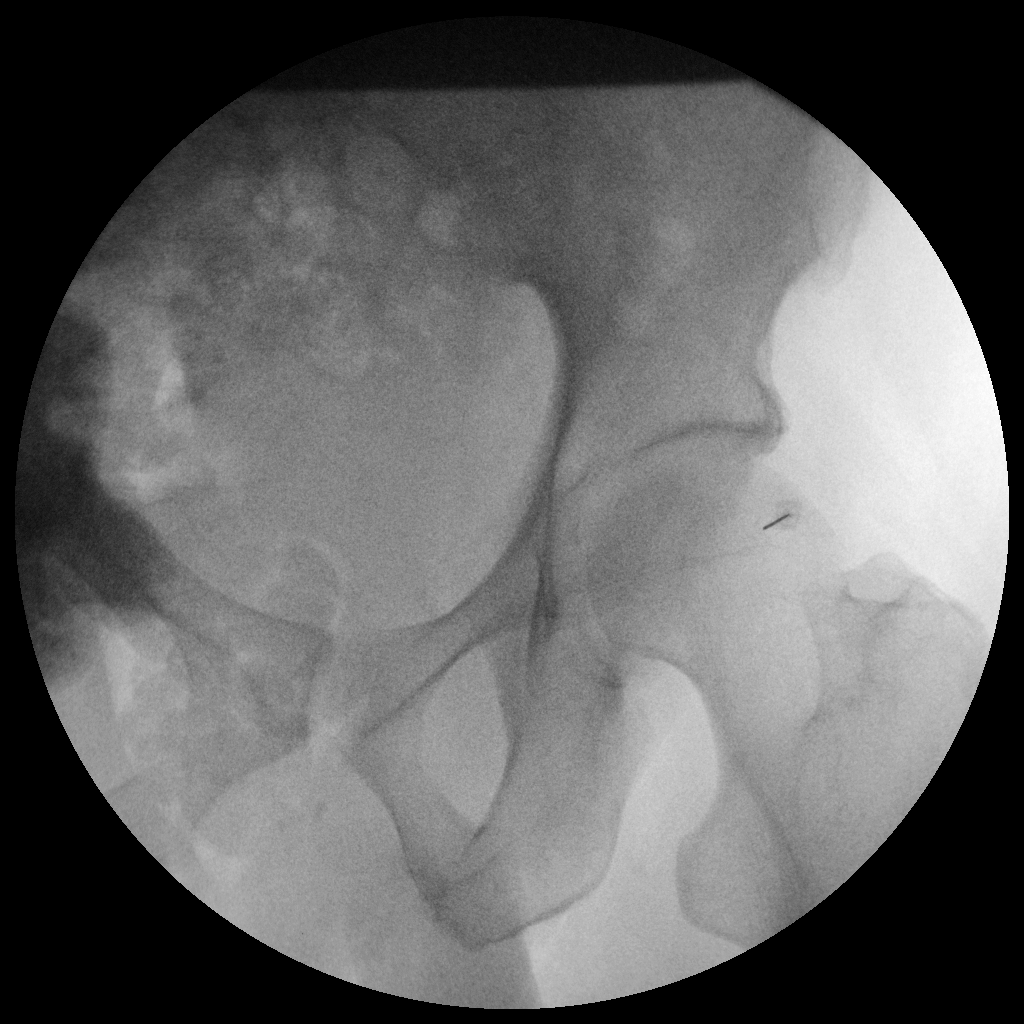

[Series 2: run · 1 of 1 slices shown (2 of 3)]
[im 1/1]
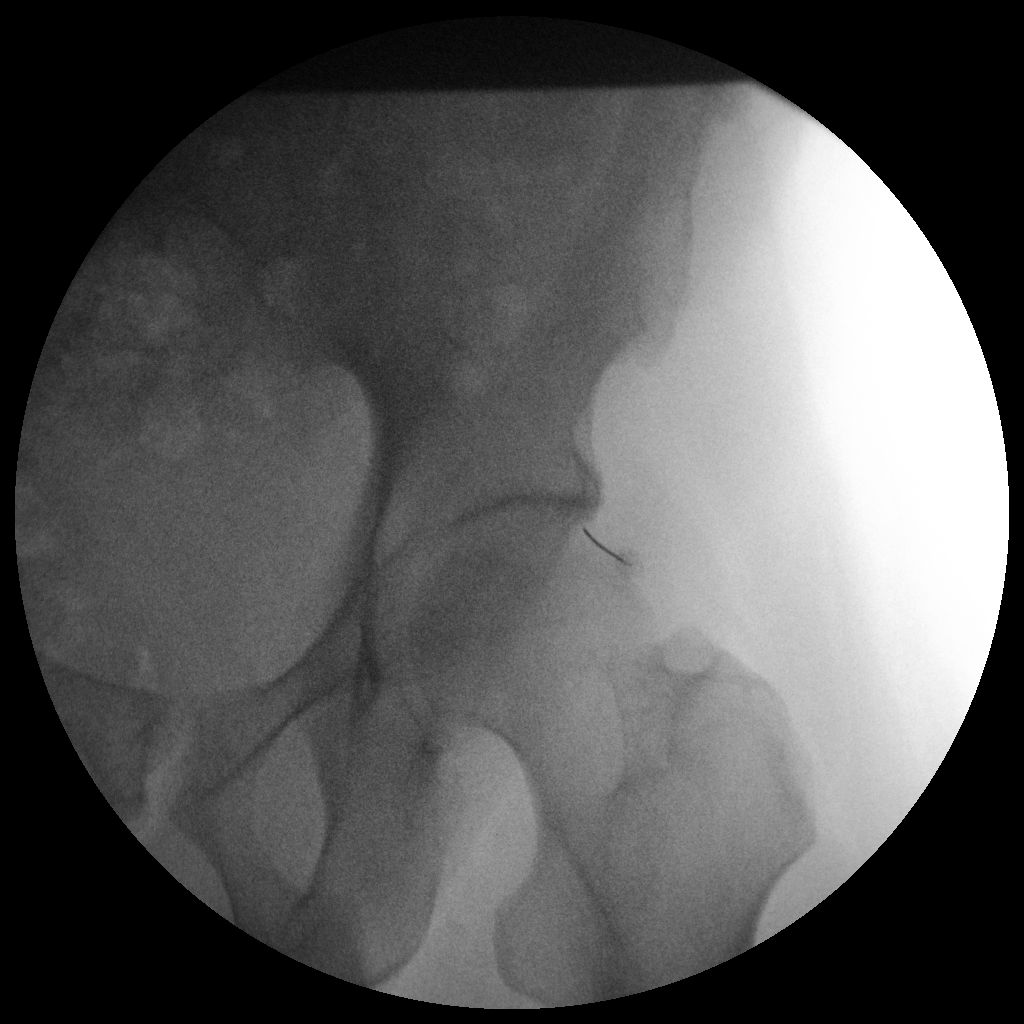

[Series 3: run · 1 of 1 slices shown (3 of 3)]
[im 1/1]
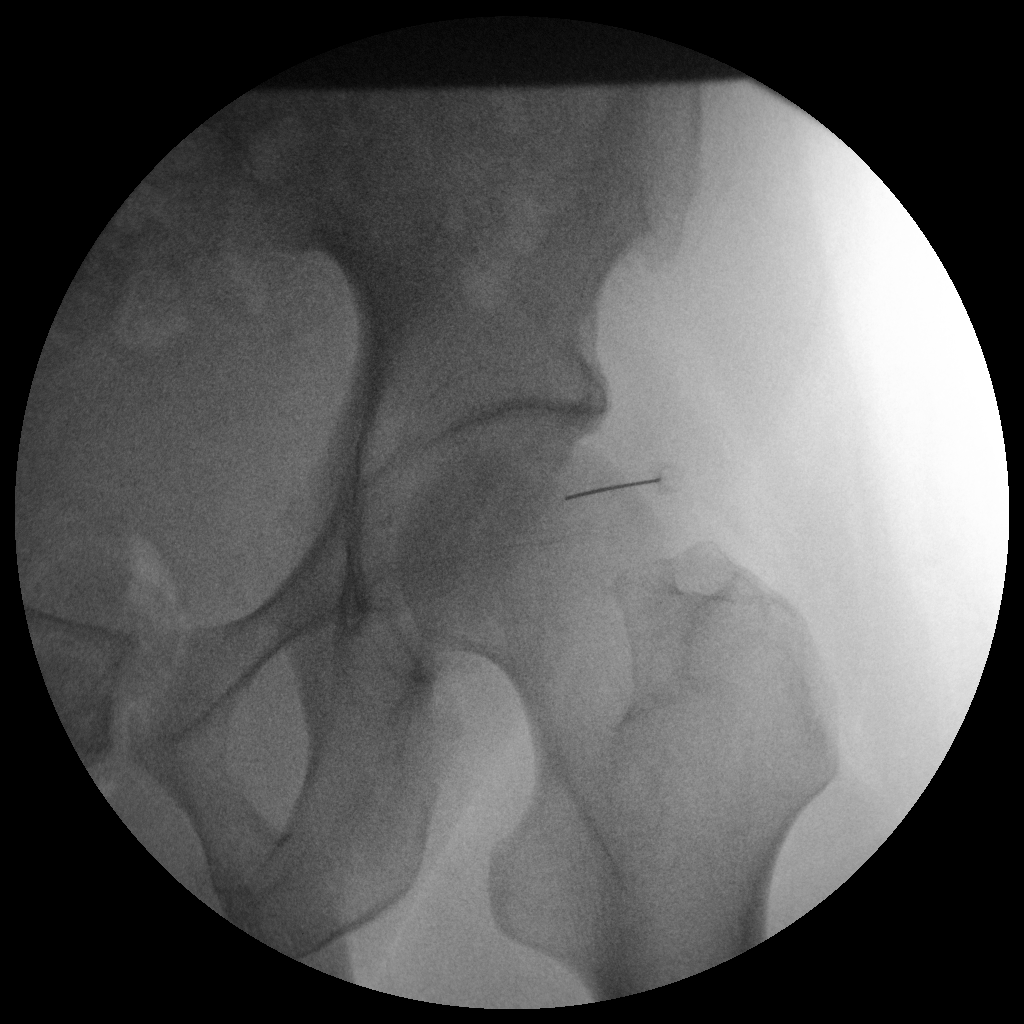

[3 of 3 positions shown; findings below may reference images not displayed]

EXAM:
ARTHROCENTESIS OF THE LEFT HIP JOINT UNDER FLUOROSCOPY

FLUOROSCOPY TIME:  23 seconds.

PROCEDURE:
Consent was obtained from the patient prior to the procedure. A
time-out was performed. Overlying skin prepped with Betadine, draped
in the usual sterile fashion, and infiltrated locally with 1%
Lidocaine. A 22 gauge spinal needle was advanced to the
superolateral margin of the left femoral head under fluoroscopy. 1
ml of Lidocaine injected easily. Aspiration was performed.
Additional aspiration was also performed with a 20 gauge spinal
needle advanced more superiorly in the joint.

Lavage was performed through the 20 gauge needle with 10 mL of
sterile saline. Aspirated fluid was sent for requested laboratory
studies.
FINDINGS: There was inability to aspirate free fluid from the joint with only
a small amount of blood aspirated initially. After sterile saline
lavage, approximately 4 mL of slightly blood-tinged fluid was
recovered and sent for laboratory testing.
IMPRESSION: Aspiration of left hip joint under fluoroscopy.

## 2016-05-30 IMAGING — CR DG HIP (WITH OR WITHOUT PELVIS) 2-3V*L*
4 series · 4 of 4 positions shown · non-contrast
Comparison: None.

CLINICAL DATA: Lateral left hip pain for 2-3 days. No known injury.
Initial encounter.

EXAM:
DG HIP (WITH OR WITHOUT PELVIS) 2-3V LEFT

[t pelvis ap (1 of 2)]
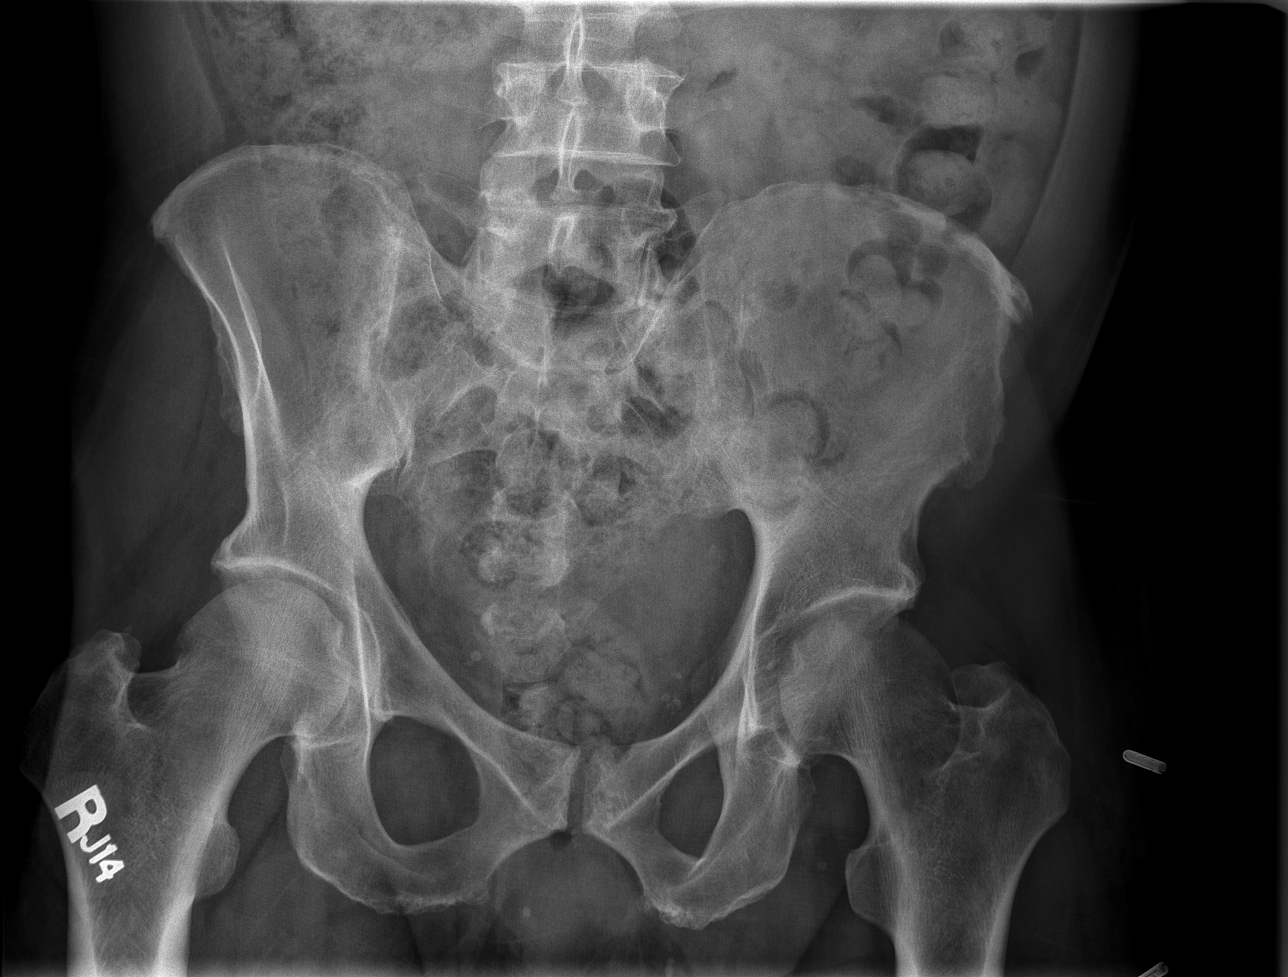

[t pelvis ap (2 of 2)]
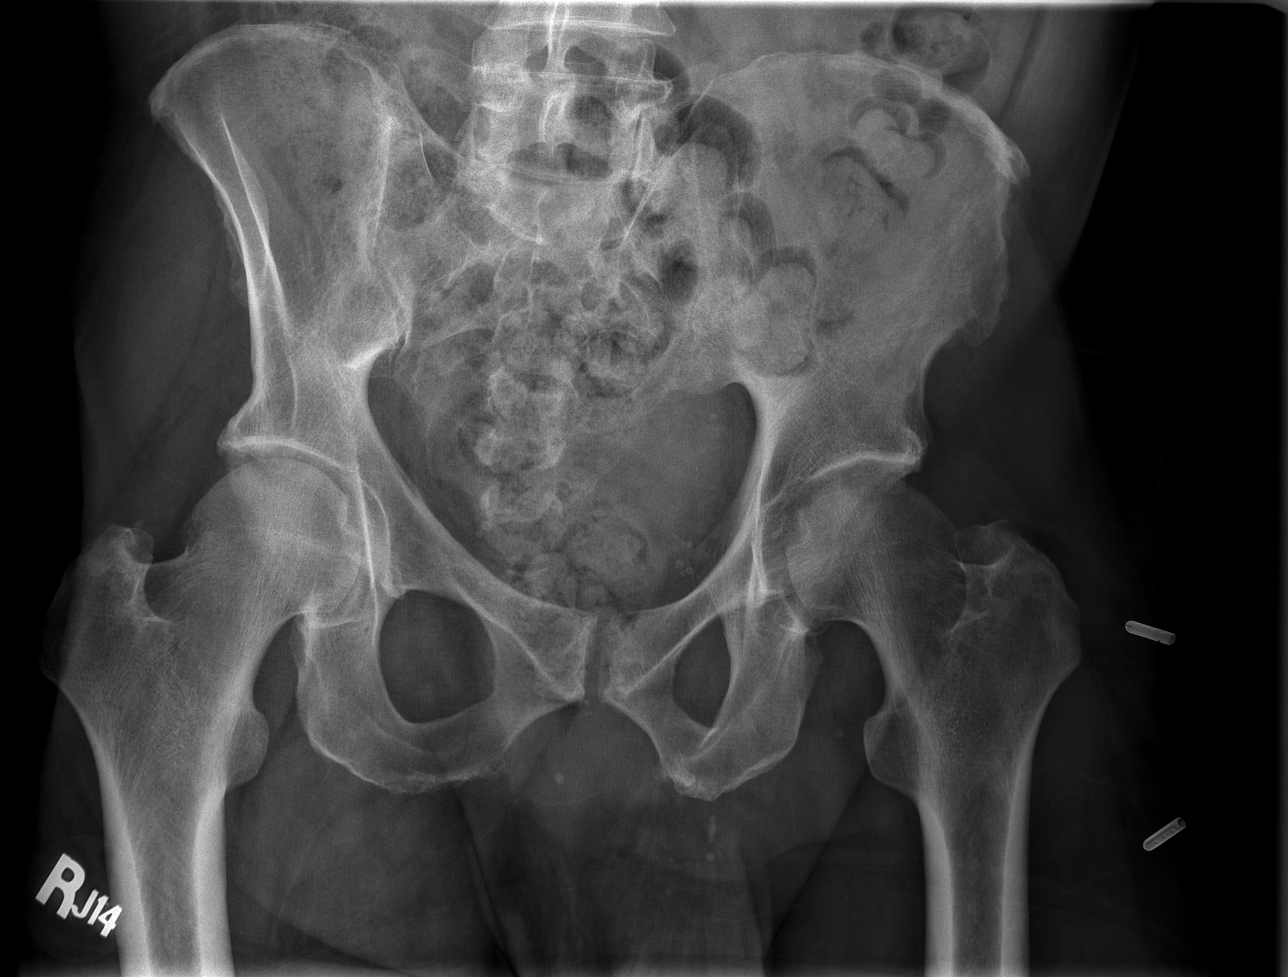

[t hip ap left]
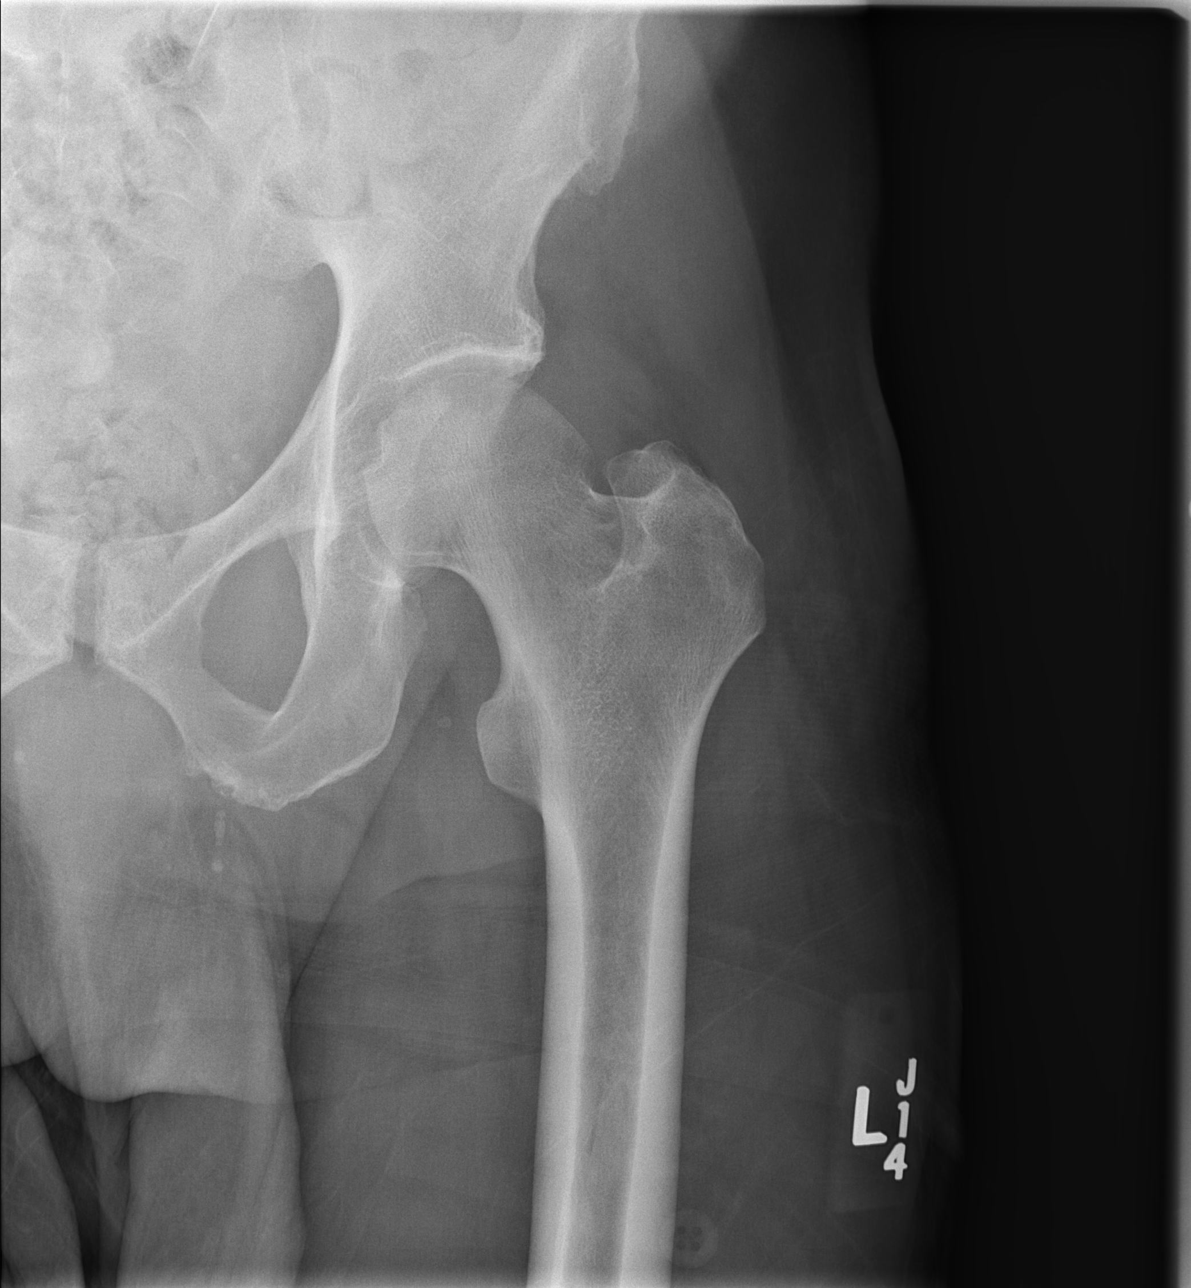

[t hip frog leg left]
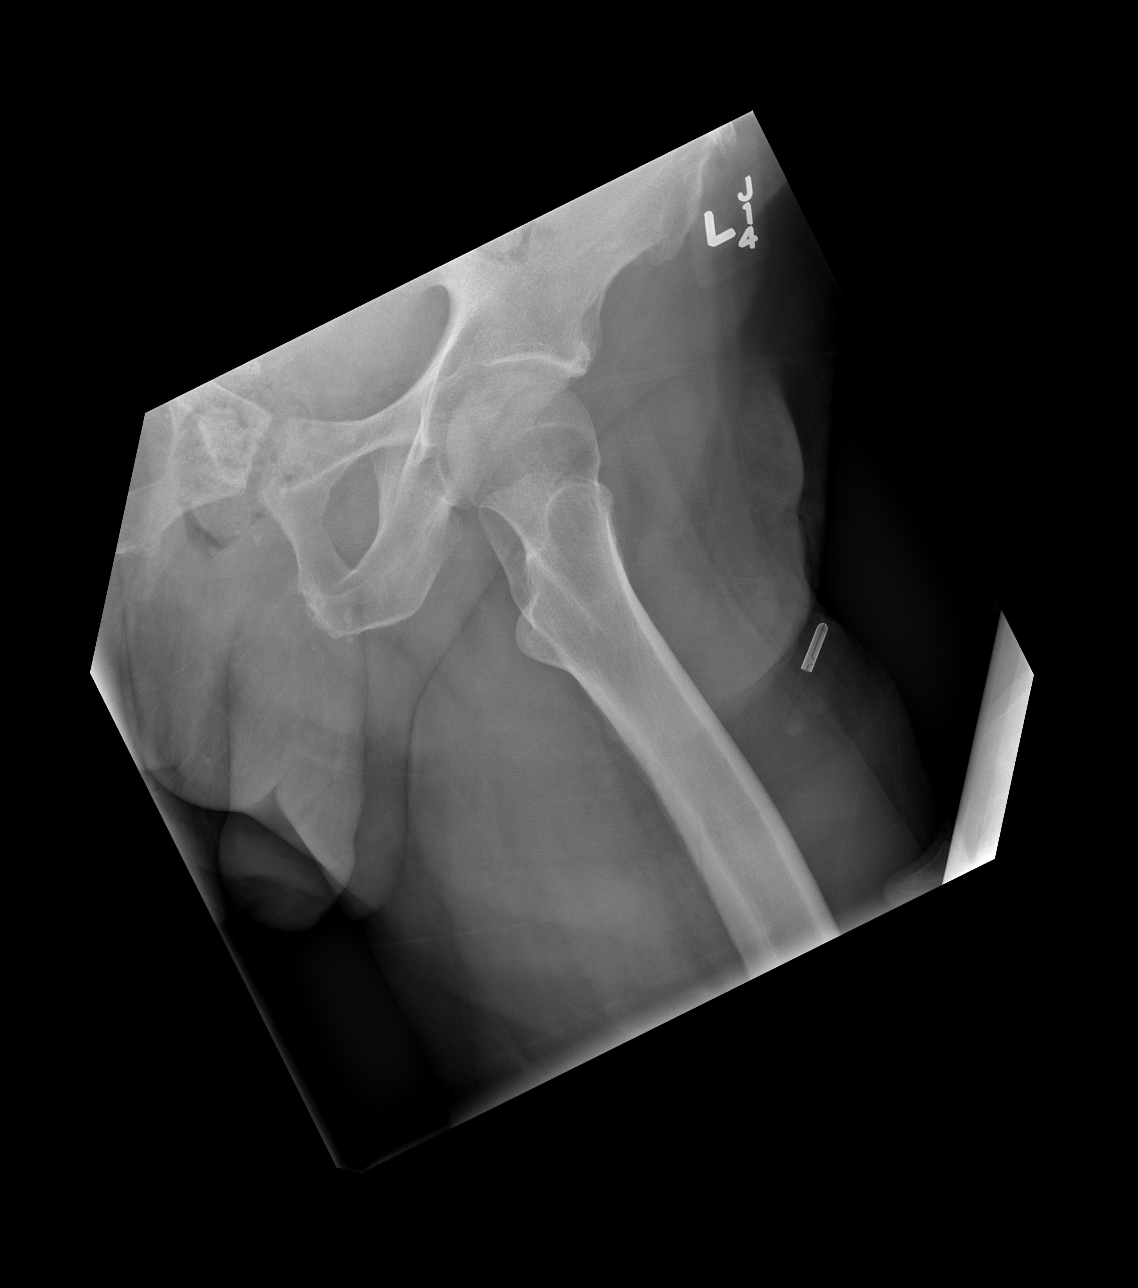

[4 of 4 positions shown; findings below may reference images not displayed]

FINDINGS: There is no acute bony or joint abnormality. No evidence of
avascular necrosis of the femoral heads is identified. No notable
degenerative change is seen about the hips, symphysis pubis or
sacroiliac joints. Imaged soft tissue structures are unremarkable.
IMPRESSION: Negative exam.

## 2016-06-01 ENCOUNTER — Ambulatory Visit: Payer: Medicaid Other | Attending: Family Medicine | Admitting: Family Medicine

## 2016-06-01 ENCOUNTER — Encounter: Payer: Self-pay | Admitting: Family Medicine

## 2016-06-01 VITALS — BP 167/95 | HR 109 | Temp 98.8°F | Ht 70.0 in | Wt 210.0 lb

## 2016-06-01 DIAGNOSIS — Z96642 Presence of left artificial hip joint: Secondary | ICD-10-CM | POA: Diagnosis not present

## 2016-06-01 DIAGNOSIS — Z9119 Patient's noncompliance with other medical treatment and regimen: Secondary | ICD-10-CM | POA: Insufficient documentation

## 2016-06-01 DIAGNOSIS — Z91199 Patient's noncompliance with other medical treatment and regimen due to unspecified reason: Secondary | ICD-10-CM

## 2016-06-01 DIAGNOSIS — Z79899 Other long term (current) drug therapy: Secondary | ICD-10-CM | POA: Insufficient documentation

## 2016-06-01 DIAGNOSIS — E1169 Type 2 diabetes mellitus with other specified complication: Secondary | ICD-10-CM | POA: Diagnosis not present

## 2016-06-01 DIAGNOSIS — Z794 Long term (current) use of insulin: Secondary | ICD-10-CM

## 2016-06-01 DIAGNOSIS — E0800 Diabetes mellitus due to underlying condition with hyperosmolarity without nonketotic hyperglycemic-hyperosmolar coma (NKHHC): Secondary | ICD-10-CM

## 2016-06-01 DIAGNOSIS — Z9114 Patient's other noncompliance with medication regimen: Secondary | ICD-10-CM | POA: Insufficient documentation

## 2016-06-01 DIAGNOSIS — I10 Essential (primary) hypertension: Secondary | ICD-10-CM | POA: Diagnosis not present

## 2016-06-01 DIAGNOSIS — Z8673 Personal history of transient ischemic attack (TIA), and cerebral infarction without residual deficits: Secondary | ICD-10-CM | POA: Diagnosis not present

## 2016-06-01 DIAGNOSIS — E870 Hyperosmolality and hypernatremia: Secondary | ICD-10-CM | POA: Diagnosis present

## 2016-06-01 DIAGNOSIS — E7800 Pure hypercholesterolemia, unspecified: Secondary | ICD-10-CM | POA: Diagnosis not present

## 2016-06-01 DIAGNOSIS — E1165 Type 2 diabetes mellitus with hyperglycemia: Secondary | ICD-10-CM | POA: Diagnosis not present

## 2016-06-01 DIAGNOSIS — E1149 Type 2 diabetes mellitus with other diabetic neurological complication: Secondary | ICD-10-CM

## 2016-06-01 DIAGNOSIS — M009 Pyogenic arthritis, unspecified: Secondary | ICD-10-CM | POA: Insufficient documentation

## 2016-06-01 DIAGNOSIS — Z9889 Other specified postprocedural states: Secondary | ICD-10-CM | POA: Diagnosis not present

## 2016-06-01 LAB — GLUCOSE, POCT (MANUAL RESULT ENTRY): POC Glucose: 257 mg/dl — AB (ref 70–99)

## 2016-06-01 MED ORDER — GABAPENTIN 300 MG PO CAPS: 300.0000 mg | ORAL_CAPSULE | Freq: Two times a day (BID) | ORAL | 3 refills | Status: AC

## 2016-06-01 MED ORDER — LISINOPRIL 10 MG PO TABS: 10.0000 mg | ORAL_TABLET | Freq: Every day | ORAL | 3 refills | Status: DC

## 2016-06-01 MED ORDER — INSULIN GLARGINE 100 UNIT/ML SOLOSTAR PEN: 25.0000 [IU] | PEN_INJECTOR | Freq: Every day | SUBCUTANEOUS | 3 refills | Status: DC

## 2016-06-01 MED ORDER — GLIPIZIDE 10 MG PO TABS: 10.0000 mg | ORAL_TABLET | Freq: Two times a day (BID) | ORAL | 3 refills | Status: DC

## 2016-06-01 MED ORDER — ATORVASTATIN CALCIUM 20 MG PO TABS: 20.0000 mg | ORAL_TABLET | Freq: Every day | ORAL | 3 refills | Status: DC

## 2016-06-01 MED ORDER — ACCU-CHEK SOFTCLIX LANCETS MISC: 11 refills | Status: DC

## 2016-06-01 MED ORDER — NIFEDIPINE ER 90 MG PO TB24: 90.0000 mg | ORAL_TABLET | Freq: Every day | ORAL | 3 refills | Status: DC

## 2016-06-01 NOTE — Progress Notes (Signed)
Medication refill Isn't taking lantus Need lancets- not testing blood sugar

## 2016-06-01 NOTE — Progress Notes (Signed)
Subjective:  Patient ID: Marc [name], male    DOB: 14-Mar-1961  Age: 56 y.o. MRN: [id number]  CC: Diabetes and Hypertension   HPI Marc [name] is a 56 y.o. male with a history of uncontrolled type 2 diabetes mellitus (A1C 11.9 ), hypertension, noncompliance with medication therapy, right sided endocarditis, MRSA bacteremia with septic emboli to the lung (diagnosis of endocarditis was clinical) who subsequently developed a left septic hip joint with osteomyelitis of the femur (Due to noncompliance with IV antibiotics) and is status post left hip resection arthroplasty, placement of temporary antibiotic cemented total hip arthroplasty from [facility] on 12/15/14.  He comes in today for follow-up on uncontrolled diabetes mellitus as medical clearance for left hip replacement (by Dr Alvan Dame) has been on hold due to poor glycemic control.  Has not been compliant with his Lantus or a diabetic diet and has not been checking his sugars due to lack of lancets, same complaint which he had at his last visit.  He has run out of his antihypertensive since his elevated blood pressure.[facility].  Has no other concerns today.  Past Medical History:  Diagnosis Date  . Acute osteomyelitis of femur ([place]) 01/19/2015  . Diabetes mellitus without complication ([place])   . Endocarditis   . Hypertension   . 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 Stroke [facility])     Past Surgical History:  Procedure Laterality Date  . NO PAST SURGERIES    . TOTAL HIP REVISION Left 12/14/2014   Procedure: Left hip resection with prostalac ;  Surgeon: Paralee Cancel, MD;  Location: [facility];  Service: Orthopedics;  Laterality: Left;  Marland Kitchen VIDEO BRONCHOSCOPY Bilateral 12/03/2014   Procedure: VIDEO BRONCHOSCOPY WITHOUT FLUORO;  Surgeon: Chesley Mires, MD;  Location: [facility];  Service: Cardiopulmonary;  Laterality: Bilateral;    No Known Allergies   Outpatient  Medications Prior to Visit  Medication Sig Dispense Refill  . ferrous sulfate 325 (65 FE) MG tablet Take 1 tablet (325 mg total) by mouth 3 (three) times daily after meals. 90 tablet 3  . traMADol (ULTRAM) 50 MG tablet TAKE 1 TABLET BY MOUTH EVERY 8 HOURS AS NEEDED 30 tablet 0  . atorvastatin (LIPITOR) 20 MG tablet Take 1 tablet (20 mg total) by mouth daily. 30 tablet 3  . glipiZIDE (GLUCOTROL) 10 MG tablet Take 1 tablet (10 mg total) by mouth 2 (two) times daily before a meal. 60 tablet 3  . NIFEdipine (ADALAT CC) 90 MG 24 hr tablet Take 1 tablet (90 mg total) by mouth daily. 30 tablet 3  . Blood Glucose Monitoring Suppl (ACCU-CHEK AVIVA PLUS) w/Device KIT USE AS DIRECTED (Patient not taking: Reported on 06/01/2016) 1 kit 0  . glucose blood (ACCU-CHEK AVIVA PLUS) test strip Use as instructed (Patient not taking: Reported on 06/01/2016) 100 each 12  . Insulin Pen Needle 31G X 5 MM MISC 1 each by Does not apply route at bedtime. (Patient not taking: Reported on 06/01/2016) 30 each 5  . ACCU-CHEK SOFTCLIX LANCETS lancets Use as instructed 3 times daily before meals (Patient not taking: Reported on 06/01/2016) 100 each 11  . gabapentin (NEURONTIN) 300 MG capsule Take 1 capsule (300 mg total) by mouth 2 (two) times daily. (Patient not taking: Reported on 06/01/2016) 60 capsule 3  . Insulin Glargine (LANTUS SOLOSTAR) 100 UNIT/ML Solostar Pen Inject 25 Units into the skin daily at 10 pm. (Patient not taking: Reported on 06/01/2016)  5 pen 3  . lisinopril (PRINIVIL,ZESTRIL) 10 MG tablet Take 1 tablet (10 mg total) by mouth daily. (Patient not taking: Reported on 06/01/2016) 30 tablet 3   No facility-administered medications prior to visit.     ROS Review of Systems General: Not in acute distress HENT: Negative for sinus pressure and sore throat.   Eyes: Negative for visual disturbance.  Respiratory: Negative for cough, chest tightness and shortness of breath.   Cardiovascular: Negative for chest pain and  leg swelling.  Gastrointestinal: Negative for abdominal distention, abdominal pain, constipation and diarrhea.  Endocrine: Negative.   Genitourinary: Negative for dysuria.  Musculoskeletal: L hip pain Skin: negative  Allergic/Immunologic: Negative.   Neurological:  Negative for weakness and light-headedness.  Psychiatric/Behavioral: Negative for dysphoric mood and suicidal ideas.   Objective:  BP (!) 167/95 (BP Location: Right Arm, Patient Position: Sitting, Cuff Size: Large)   Pulse (!) 109   Temp 98.8 F (37.1 C) (Oral)   Ht 5' 10"  (1.778 m)   Wt 210 lb (95.3 kg)   SpO2 98%   BMI 30.13 kg/m   BP/Weight 06/01/2016 03/13/2016 6/80/8811  Systolic BP 031 594 585  Diastolic BP 95 78 95  Wt. (Lbs) 210 207.8 205.4  BMI 30.13 28.98 28.65      Physical Exam Constitutional: He is oriented to person, place, and time. He appears well-developed and well-nourished.  Cardiovascular: Tachycardic, Normal heart sounds and intact distal pulses.   No murmur heard. Pulmonary/Chest: Effort normal and breath sounds normal. He has no wheezes. He has no rales. He exhibits no tenderness.  Abdominal: Soft. Bowel sounds are normal. He exhibits no distension and no mass. There is no tenderness.  Musculoskeletal: Tenderness in L hip on range of motion Neurological: He is alert and oriented to person, place, and time.  Skin: Normal  Psychiatric: He has a normal mood and affect.   Lab Results  Component Value Date   HGBA1C 11.9 03/13/2016    CMP Latest Ref Rng & Units 03/13/2016 12/16/2015 07/09/2015  Glucose 65 - 99 mg/dL 363(H) 222(H) 301(H)  BUN 7 - 25 mg/dL 43(H) 19 30(H)  Creatinine 0.70 - 1.33 mg/dL 2.13(H) 1.91(H) 1.86(H)  Sodium 135 - 146 mmol/L 135 139 136  Potassium 3.5 - 5.3 mmol/L 4.7 5.2 4.8  Chloride 98 - 110 mmol/L 100 103 101  CO2 20 - 31 mmol/L 23 27 26   Calcium 8.6 - 10.3 mg/dL 9.8 9.9 9.2  Total Protein 6.1 - 8.1 g/dL 7.8 7.6 -  Total Bilirubin 0.2 - 1.2 mg/dL 0.4 0.3 -    Alkaline Phos 40 - 115 U/L 128(H) 134(H) -  AST 10 - 35 U/L 15 18 -  ALT 9 - 46 U/L 18 20 -    Lipid Panel     Component Value Date/Time   CHOL 229 (H) 12/16/2015 1014   TRIG 349 (H) 12/16/2015 1014   HDL 33 (L) 12/16/2015 1014   CHOLHDL 6.9 (H) 12/16/2015 1014   VLDL 70 (H) 12/16/2015 1014   LDLCALC 126 12/16/2015 1014    Assessment & Plan:   1. Diabetes mellitus due to underlying condition, uncontrolled, with hyperosmolarity without coma, with long-term current use of insulin (HCC) Uncontrolled with A1c of 11.9 Strongly advised to comply with medications and commence Lantus Diabetic diet - Glucose (CBG) - ACCU-CHEK SOFTCLIX LANCETS lancets; Use as instructed 3 times daily before meals  Dispense: 100 each; Refill: 11 - glipiZIDE (GLUCOTROL) 10 MG tablet; Take 1 tablet (10 mg total) by  mouth 2 (two) times daily before a meal.  Dispense: 60 tablet; Refill: 3 - Insulin Glargine (LANTUS SOLOSTAR) 100 UNIT/ML Solostar Pen; Inject 25 Units into the skin daily at 10 pm.  Dispense: 5 pen; Refill: 3  2. Other diabetic neurological complication associated with type 2 diabetes mellitus (HCC) Stable - gabapentin (NEURONTIN) 300 MG capsule; Take 1 capsule (300 mg total) by mouth 2 (two) times daily.  Dispense: 60 capsule; Refill: 3  3. Essential hypertension Uncontrolled due to running out of occasions Low-sodium diet - lisinopril (PRINIVIL,ZESTRIL) 10 MG tablet; Take 1 tablet (10 mg total) by mouth daily.  Dispense: 30 tablet; Refill: 3 - NIFEdipine (ADALAT CC) 90 MG 24 hr tablet; Take 1 tablet (90 mg total) by mouth daily.  Dispense: 30 tablet; Refill: 3  4. Pure hypercholesterolemia Uncontrolled due to noncompliance We will increase dose of statin at next visit - atorvastatin (LIPITOR) 20 MG tablet; Take 1 tablet (20 mg total) by mouth daily.  Dispense: 30 tablet; Refill: 3  5. Non-compliance with treatment Discussed implications of noncompliance with treatment including  cardiovascular events, hemodialysis and Simona Huh The patient is nonchalant   Meds ordered this encounter  Medications  . ACCU-CHEK SOFTCLIX LANCETS lancets    Sig: Use as instructed 3 times daily before meals    Dispense:  100 each    Refill:  11  . glipiZIDE (GLUCOTROL) 10 MG tablet    Sig: Take 1 tablet (10 mg total) by mouth 2 (two) times daily before a meal.    Dispense:  60 tablet    Refill:  3  . atorvastatin (LIPITOR) 20 MG tablet    Sig: Take 1 tablet (20 mg total) by mouth daily.    Dispense:  30 tablet    Refill:  3  . lisinopril (PRINIVIL,ZESTRIL) 10 MG tablet    Sig: Take 1 tablet (10 mg total) by mouth daily.    Dispense:  30 tablet    Refill:  3  . NIFEdipine (ADALAT CC) 90 MG 24 hr tablet    Sig: Take 1 tablet (90 mg total) by mouth daily.    Dispense:  30 tablet    Refill:  3  . gabapentin (NEURONTIN) 300 MG capsule    Sig: Take 1 capsule (300 mg total) by mouth 2 (two) times daily.    Dispense:  60 capsule    Refill:  3  . Insulin Glargine (LANTUS SOLOSTAR) 100 UNIT/ML Solostar Pen    Sig: Inject 25 Units into the skin daily at 10 pm.    Dispense:  5 pen    Refill:  3    Follow-up: Return in about 1 month (around 07/02/2016) for Follow-up on blood pressure and diabetes.   Arnoldo Morale MD

## 2016-06-12 ENCOUNTER — Telehealth: Payer: Self-pay

## 2016-06-12 NOTE — Telephone Encounter (Signed)
This CM spoke to Clinton QuantBernetta Terry, LPN [id number] who stated that she plans to discharge the patient from their services because he will not accept any help/guidance/teaching from her.   Message routed to Dr Venetia NightAmao

## 2016-06-13 NOTE — Telephone Encounter (Signed)
Noted  

## 2016-07-28 ENCOUNTER — Ambulatory Visit: Payer: Medicaid Other | Admitting: Family Medicine

## 2016-08-28 ENCOUNTER — Telehealth: Payer: Self-pay

## 2016-08-28 NOTE — Telephone Encounter (Signed)
Called to find out if pt would like colonoscopy scheduled 

## 2016-10-12 ENCOUNTER — Ambulatory Visit: Payer: Medicaid Other | Admitting: Family Medicine

## 2017-01-17 ENCOUNTER — Ambulatory Visit: Payer: Medicaid Other | Admitting: Family Medicine

## 2017-01-26 ENCOUNTER — Ambulatory Visit: Payer: Medicaid Other | Admitting: Family Medicine

## 2017-02-12 ENCOUNTER — Emergency Department (HOSPITAL_COMMUNITY)
Admission: EM | Admit: 2017-02-12 | Discharge: 2017-02-13 | Discharge status: Against Medical Advice | Payer: Medicaid Other

## 2017-02-12 NOTE — ED Notes (Signed)
Pt called for a 4th time. Pt will be moved OTF.

## 2017-02-12 NOTE — ED Notes (Addendum)
Pt called for Triage. No answer. 

## 2017-02-12 NOTE — ED Notes (Signed)
Pt was called by 2 RNs with no response from waiting room

## 2017-02-12 NOTE — ED Triage Notes (Signed)
No answer in waiting room 

## 2017-02-15 ENCOUNTER — Emergency Department (HOSPITAL_COMMUNITY)
Admission: EM | Admit: 2017-02-15 | Discharge: 2017-02-15 | Disposition: A | Discharge status: Home / Self Care | Payer: Medicaid Other | Attending: Emergency Medicine | Admitting: Emergency Medicine

## 2017-02-15 DIAGNOSIS — M25559 Pain in unspecified hip: Secondary | ICD-10-CM | POA: Diagnosis present

## 2017-02-15 DIAGNOSIS — Z5321 Procedure and treatment not carried out due to patient leaving prior to being seen by health care provider: Secondary | ICD-10-CM | POA: Insufficient documentation

## 2017-02-15 NOTE — ED Triage Notes (Signed)
Pt brought to triage, states he needed to step out and would be right back. Pt never returned. Unable to locate in waiting room.

## 2017-02-15 NOTE — ED Notes (Signed)
Called for triage X2 with no answer 

## 2017-02-16 ENCOUNTER — Telehealth: Payer: Self-pay | Admitting: Family Medicine

## 2017-02-16 DIAGNOSIS — I10 Essential (primary) hypertension: Secondary | ICD-10-CM

## 2017-02-16 MED ORDER — LISINOPRIL 10 MG PO TABS: 10.0000 mg | ORAL_TABLET | Freq: Every day | ORAL | 0 refills | Status: DC

## 2017-02-16 NOTE — Telephone Encounter (Signed)
Patient called requesting medication refill on lisinopril (PRINIVIL,ZESTRIL) 10 MG tablet Please f/up

## 2017-02-16 NOTE — Telephone Encounter (Signed)
Refilled 2 week supply to last until appt.

## 2017-02-19 ENCOUNTER — Ambulatory Visit: Payer: Medicaid Other | Admitting: Internal Medicine

## 2017-02-27 ENCOUNTER — Ambulatory Visit: Payer: Medicaid Other | Admitting: Family Medicine

## 2017-03-20 ENCOUNTER — Emergency Department (HOSPITAL_COMMUNITY)
Admission: EM | Admit: 2017-03-20 | Discharge: 2017-03-20 | Discharge status: Against Medical Advice | Payer: Medicaid Other

## 2017-03-20 NOTE — ED Notes (Signed)
Pt sitting in waiting room

## 2017-03-20 NOTE — ED Notes (Signed)
Called for triage X 3 with no answer. 

## 2017-04-04 ENCOUNTER — Ambulatory Visit: Payer: Medicaid Other | Admitting: Family Medicine

## 2017-04-23 ENCOUNTER — Emergency Department (HOSPITAL_COMMUNITY)
Admission: EM | Admit: 2017-04-23 | Discharge: 2017-04-24 | Discharge status: Against Medical Advice | Payer: Medicaid Other

## 2017-04-23 ENCOUNTER — Other Ambulatory Visit: Payer: Self-pay

## 2018-06-22 ENCOUNTER — Emergency Department (HOSPITAL_COMMUNITY)
Admission: EM | Admit: 2018-06-22 | Discharge: 2018-06-22 | Discharge status: Absent without leave | Payer: Medicaid Other

## 2018-06-22 NOTE — ED Notes (Addendum)
Pt checked in and I told him we were taking him to a treatment room.  Pt states he needs to go make a phone call first.  Pt went to phones and then went up elevator.  Pt came back off elevator and used phone again.  Pt seen by registration going back up elevator.  NT went and looked for patient and he cannot be found at this time.

## 2018-07-08 ENCOUNTER — Ambulatory Visit: Payer: Medicaid Other | Attending: Internal Medicine | Admitting: Internal Medicine

## 2018-07-08 VITALS — BP 185/110 | HR 122 | Temp 98.8°F | Resp 16

## 2018-07-08 DIAGNOSIS — Z86711 Personal history of pulmonary embolism: Secondary | ICD-10-CM | POA: Insufficient documentation

## 2018-07-08 DIAGNOSIS — E1110 Type 2 diabetes mellitus with ketoacidosis without coma: Secondary | ICD-10-CM | POA: Insufficient documentation

## 2018-07-08 DIAGNOSIS — IMO0002 Reserved for concepts with insufficient information to code with codable children: Secondary | ICD-10-CM

## 2018-07-08 DIAGNOSIS — Z8249 Family history of ischemic heart disease and other diseases of the circulatory system: Secondary | ICD-10-CM | POA: Diagnosis not present

## 2018-07-08 DIAGNOSIS — Z79899 Other long term (current) drug therapy: Secondary | ICD-10-CM | POA: Diagnosis not present

## 2018-07-08 DIAGNOSIS — E1165 Type 2 diabetes mellitus with hyperglycemia: Secondary | ICD-10-CM

## 2018-07-08 DIAGNOSIS — D649 Anemia, unspecified: Secondary | ICD-10-CM | POA: Diagnosis not present

## 2018-07-08 DIAGNOSIS — Z96643 Presence of artificial hip joint, bilateral: Secondary | ICD-10-CM | POA: Insufficient documentation

## 2018-07-08 DIAGNOSIS — E1142 Type 2 diabetes mellitus with diabetic polyneuropathy: Secondary | ICD-10-CM | POA: Diagnosis present

## 2018-07-08 DIAGNOSIS — E7800 Pure hypercholesterolemia, unspecified: Secondary | ICD-10-CM | POA: Diagnosis not present

## 2018-07-08 DIAGNOSIS — Z833 Family history of diabetes mellitus: Secondary | ICD-10-CM | POA: Insufficient documentation

## 2018-07-08 DIAGNOSIS — Z794 Long term (current) use of insulin: Secondary | ICD-10-CM | POA: Diagnosis not present

## 2018-07-08 DIAGNOSIS — D509 Iron deficiency anemia, unspecified: Secondary | ICD-10-CM

## 2018-07-08 DIAGNOSIS — Z9114 Patient's other noncompliance with medication regimen: Secondary | ICD-10-CM | POA: Diagnosis not present

## 2018-07-08 DIAGNOSIS — I10 Essential (primary) hypertension: Secondary | ICD-10-CM | POA: Diagnosis not present

## 2018-07-08 DIAGNOSIS — R000 Tachycardia, unspecified: Secondary | ICD-10-CM | POA: Diagnosis not present

## 2018-07-08 LAB — POCT GLYCOSYLATED HEMOGLOBIN (HGB A1C): HBA1C, POC (CONTROLLED DIABETIC RANGE): 9.8 % — AB (ref 0.0–7.0)

## 2018-07-08 LAB — GLUCOSE, POCT (MANUAL RESULT ENTRY): POC Glucose: 197 mg/dl — AB (ref 70–99)

## 2018-07-08 MED ORDER — GLUCOSE BLOOD VI STRP: ORAL_STRIP | 12 refills | Status: AC

## 2018-07-08 MED ORDER — GLIPIZIDE 10 MG PO TABS: 10.0000 mg | ORAL_TABLET | Freq: Every day | ORAL | 2 refills | Status: AC

## 2018-07-08 MED ORDER — LISINOPRIL 10 MG PO TABS: 10.0000 mg | ORAL_TABLET | Freq: Every day | ORAL | 2 refills | Status: AC

## 2018-07-08 MED ORDER — INSULIN GLARGINE 100 UNIT/ML SOLOSTAR PEN: 12.0000 [IU] | PEN_INJECTOR | Freq: Every day | SUBCUTANEOUS | 2 refills | Status: AC

## 2018-07-08 MED ORDER — CLONIDINE HCL 0.1 MG PO TABS
0.1000 mg | ORAL_TABLET | Freq: Once | ORAL | Status: AC
  Administered 2018-07-08: 0.1 mg via ORAL

## 2018-07-08 MED ORDER — ATORVASTATIN CALCIUM 20 MG PO TABS: 20.0000 mg | ORAL_TABLET | Freq: Every day | ORAL | 2 refills | Status: AC

## 2018-07-08 MED ORDER — ACCU-CHEK SOFTCLIX LANCETS MISC: 11 refills | Status: AC

## 2018-07-08 MED ORDER — INSULIN PEN NEEDLE 31G X 5 MM MISC: 1.0000 | Freq: Every day | 5 refills | Status: AC

## 2018-07-08 MED ORDER — NIFEDIPINE ER 90 MG PO TB24: 90.0000 mg | ORAL_TABLET | Freq: Every day | ORAL | 2 refills | Status: AC

## 2018-07-08 NOTE — Patient Instructions (Addendum)
Follow-up next Wednesday  with clinical pharmacist for repeat blood pressure check.  Please take your medications as prescribed.  I have refilled lisinopril and nifedipine.  I have also refilled Lantus insulin and your oral diabetes pill called glipizide.  I have sent refills to the pharmacy for your diabetic testing supplies.  Td Vaccine (Tetanus and Diphtheria): What You Need to Know 1. Why get vaccinated? Tetanus  and diphtheria are very serious diseases. They are rare in the [place] today, but people who do become infected often have severe complications. Td vaccine is used to protect adolescents and adults from both of these diseases. Both tetanus and diphtheria are infections caused by bacteria. Diphtheria spreads from person to person through coughing or sneezing. Tetanus-causing bacteria enter the body through cuts, scratches, or wounds. TETANUS (Lockjaw) causes painful muscle tightening and stiffness, usually all over the body.  It can lead to tightening of muscles in the head and neck so you can't open your mouth, swallow, or sometimes even breathe. Tetanus kills about 1 out of every 10 people who are infected even after receiving the best medical care. DIPHTHERIA can cause a thick coating to form in the back of the throat.  It can lead to breathing problems, paralysis, heart failure, and death. Before vaccines, as many as 200,000 cases of diphtheria and hundreds of cases of tetanus were reported in the [place] each year. Since vaccination began, reports of cases for both diseases have dropped by about 99%. 2. Td vaccine Td vaccine can protect adolescents and adults from tetanus and diphtheria. Td is usually given as a booster dose every 10 years but it can also be given earlier after a severe and dirty wound or burn. Another vaccine, called Tdap, which protects against pertussis in addition to tetanus and diphtheria, is sometimes recommended instead of Td vaccine. Your doctor  or the person giving you the vaccine can give you more information. Td may safely be given at the same time as other vaccines. 3. Some people should not get this vaccine  A person who has ever had a life-threatening allergic reaction after a previous dose of any tetanus or diphtheria containing vaccine, OR has a severe allergy to any part of this vaccine, should not get Td vaccine. Tell the person giving the vaccine about any severe allergies.  Talk to your doctor if you: ? had severe pain or swelling after any vaccine containing diphtheria or tetanus, ? ever had a condition called Guillain Barr Syndrome (GBS), ? aren't feeling well on the day the shot is scheduled. 4. Risks of a vaccine reaction With any medicine, including vaccines, there is a chance of side effects. These are usually mild and go away on their own. Serious reactions are also possible but are rare. Most people who get Td vaccine do not have any problems with it. Mild Problems following Td vaccine: (Did not interfere with activities)  Pain where the shot was given (about 8 people in 10)  Redness or swelling where the shot was given (about 1 person in 4)  Mild fever (rare)  Headache (about 1 person in 4)  Tiredness (about 1 person in 4) Moderate Problems following Td vaccine: (Interfered with activities, but did not require medical attention)  Fever over 102F (rare) Severe Problems following Td vaccine: (Unable to perform usual activities; required medical attention)  Swelling, severe pain, bleeding and/or redness in the arm where the shot was given (rare). Problems that could happen after any vaccine:  People sometimes faint after a medical procedure, including vaccination. Sitting or lying down for about 15 minutes can help prevent fainting, and injuries caused by a fall. Tell your doctor if you feel dizzy, or have vision changes or ringing in the ears.  Some people get severe pain in the shoulder and have  difficulty moving the arm where a shot was given. This happens very rarely.  Any medication can cause a severe allergic reaction. Such reactions from a vaccine are very rare, estimated at fewer than 1 in a million doses, and would happen within a few minutes to a few hours after the vaccination. As with any medicine, there is a very remote chance of a vaccine causing a serious injury or death. The safety of vaccines is always being monitored. For more information, visit: http://floyd.org/ 5. What if there is a serious reaction? What should I look for?  Look for anything that concerns you, such as signs of a severe allergic reaction, very high fever, or unusual behavior. Signs of a severe allergic reaction can include hives, swelling of the face and throat, difficulty breathing, a fast heartbeat, dizziness, and weakness. These would usually start a few minutes to a few hours after the vaccination. What should I do?  If you think it is a severe allergic reaction or other emergency that can't wait, call 9-1-1 or get the person to the nearest hospital. Otherwise, call your doctor.  Afterward, the reaction should be reported to the Vaccine Adverse Event Reporting System (VAERS). Your doctor might file this report, or you can do it yourself through the VAERS web site at www.vaers.LAgents.no, or by calling 1-(713) 884-0784. VAERS does not give medical advice. 6. The National Vaccine Injury Compensation Program The [employer] Vaccine Injury Compensation Program (VICP) is a federal program that was created to compensate people who may have been injured by certain vaccines. Persons who believe they may have been injured by a vaccine can learn about the program and about filing a claim by calling 1-(814) 773-4794 or visiting the VICP website at SpiritualWord.at. There is a time limit to file a claim for compensation. 7. How can I learn more?  Ask your doctor. He or she can give you the  vaccine package insert or suggest other sources of information.  Call your local or state health department.  Contact the Centers for Disease Control and Prevention (CDC): ? Call (406) 873-3687 (1-800-CDC-INFO) ? Visit CDC's website at PicCapture.uy Vaccine Information Statement Td Vaccine (08/31/15) This information is not intended to replace advice given to you by your health care provider. Make sure you discuss any questions you have with your health care provider. Document Released: 03/05/2006 Document Revised: 12/24/2017 Document Reviewed: 12/24/2017 Elsevier Interactive Patient Education  2019 [employer].

## 2018-07-08 NOTE — Progress Notes (Addendum)
Patient ID: Marc [name], male    DOB: 24-Apr-1961  MRN: [id number]  CC: Hypertension and Diabetes   Subjective: Marc [name] is a 58 y.o. 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   Patient presents today requesting refills on medications.  He states that he will be having surgery on his hip soon and will also need clearance for that.  HTN: On last office visit he was on lisinopril and nifedipine.  He ran out of medicines sometime ago and was seen at [facility] last month where he got refills on both medicines but patient states that he is only taking one of them and he does not know which one.  He was out of town for the past 3 days and forgot to take his medicine with him.  He just took medicine today after he got back in town.  No device to check BP No HA/dizziness/SOB/CP/palpitations/LE edema Not using too much salt in foods  DIABETES TYPE 2 Last A1C:   Results for orders placed or performed in visit on 07/08/18  POCT glucose (manual entry)  Result Value Ref Range   POC Glucose 197 (A) 70 - 99 mg/dl  POCT glycosylated hemoglobin (Hb A1C)  Result Value Ref Range   Hemoglobin A1C     HbA1c POC (<> result, manual entry)     HbA1c, POC (prediabetic range)     HbA1c, POC (controlled diabetic range) 9.8 (A) 0.0 - 7.0 %    Med Adherence:  []  Yes    [x]  No, out of meds x 6 mths Medication side effects:  []  Yes    []  No Home Monitoring?  []   Yes    [x]  No, out of lancets Home glucose results range: Diet Adherence: [x]  Yes.  Pt feels he is doing okay.  On further questioning, he admits that he eats a lot of junk foods   []  No Exercise: [x]  Yes.  "I do a lot of walking."  Some days he walks up to 30 min   []  No Hypoglycemic episodes?: []  Yes    []  No Numbness of the feet? []  Yes    [x]  No Retinopathy hx? []  Yes    []  No Last eye exam: He does not recall Comments: no blurred vision   Patient Active Problem List   Diagnosis Date Noted  . Hyperlipidemia 12/17/2015  . Diabetic neuropathy ([place]) 12/15/2015  . 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 Acute osteomyelitis of femur ([place]) 01/19/2015  . History of arthroplasty of left hip 12/21/2014  . Hypoglycemia   . Septic arthritis ([place]) 12/12/2014  . Uncontrolled diabetes mellitus ([place]) 12/12/2014  . Anemia 12/12/2014  . Hip pain   . Endocarditis 12/08/2014  . Non-compliance with treatment 12/08/2014  . Septic embolism ([place])   . MRSA bacteremia 11/30/2014  . Diabetes mellitus type 2, uncontrolled ([place]) 11/30/2014  . Leukocytosis   . Sepsis ([place]) 11/20/2014  . Bladder outlet obstruction 11/20/2014  . Obstructive uropathy 11/20/2014  .  DKA, type 2, not at goal [facility]) 11/20/2014  . Acute renal failure ([place]) 11/17/2014  . Hyponatremia 11/17/2014  . Hypokalemia 11/17/2014  . Essential hypertension 11/17/2014  . Hyperglycemia 11/16/2014  . Hyperglycemia due to type 2 diabetes mellitus ([place]) 11/16/2014     Current Outpatient Medications on File Prior to Visit  Medication Sig Dispense Refill  . Blood Glucose Monitoring Suppl (ACCU-CHEK AVIVA PLUS) w/Device KIT USE AS DIRECTED (Patient not taking: Reported on 06/01/2016) 1 kit 0  . ferrous sulfate 325 (65 FE) MG tablet Take 1 tablet (325 mg total) by mouth 3 (three) times daily after meals. 90 tablet 3  . gabapentin (NEURONTIN) 300 MG capsule Take 1 capsule (300 mg total) by mouth 2 (two) times daily. 60  capsule 3   No current facility-administered medications on file prior to visit.     No Known Allergies  Social History   Socioeconomic History  . Marital status: Single    Spouse name: Not on file  . Number of children: Not on file  . Years of education: Not on file  . Highest education level: Not on file  Occupational History  . Not on file  Social Needs  . Financial resource strain: Not on file  . Food insecurity:    Worry: Not on file    Inability: Not on file  . Transportation needs:    Medical: Not on file    Non-medical: Not on file  Tobacco Use  . Smoking status: Never Smoker  . Smokeless tobacco: Never Used  Substance and Sexual Activity  . Alcohol use: No    Alcohol/week: 0.0 standard drinks    Comment: noen since 2016   . Drug use: No  . Sexual activity: Not on file  Lifestyle  . Physical activity:    Days per week: Not on file    Minutes per session: Not on file  . Stress: Not on file  Relationships  . Social connections:    Talks on phone: Not on file    Gets together: Not on file    Attends religious service: Not on file    Active member of club or organization: Not on file    Attends meetings of clubs or organizations: Not on file    Relationship status: Not on file  . Intimate partner violence:    Fear of current or ex partner: Not on file    Emotionally abused: Not on file    Physically abused: Not on file    Forced sexual activity: Not on file  Other Topics Concern  . Not on file  Social History Narrative  . Not on file    Family History  Problem Relation Age of Onset  . Hypertension Mother   . Diabetes Sister   . Diabetes Brother     Past Surgical History:  Procedure Laterality Date  . NO PAST SURGERIES    . TOTAL HIP REVISION Left 12/14/2014   Procedure: Left hip resection with prostalac ;  Surgeon: Paralee Cancel, MD;  Location: [facility];  Service: Orthopedics;  Laterality: Left;  Marland Kitchen VIDEO BRONCHOSCOPY Bilateral 12/03/2014    Procedure: VIDEO BRONCHOSCOPY WITHOUT FLUORO;  Surgeon: Chesley Mires, MD;  Location: [facility] ENDOSCOPY;  Service: Cardiopulmonary;  Laterality: Bilateral;    ROS: Review of Systems Negative except as stated above  PHYSICAL EXAM: BP (!) 185/110   Pulse (!) 122   Temp 98.8 F (37.1 C) (Oral)   Resp 16   SpO2 99%   Physical  Exam General appearance -middle-aged African-American male in NAD. Mental status -patient with odd affect. Patient declines having me examine him stating that he does not want any of that done he just wants his medicines Patient also declines having an EKG done CMP Latest Ref Rng & Units 03/13/2016 12/16/2015 07/09/2015  Glucose 65 - 99 mg/dL 363(H) 222(H) 301(H)  BUN 7 - 25 mg/dL 43(H) 19 30(H)  Creatinine 0.70 - 1.33 mg/dL 2.13(H) 1.91(H) 1.86(H)  Sodium 135 - 146 mmol/L 135 139 136  Potassium 3.5 - 5.3 mmol/L 4.7 5.2 4.8  Chloride 98 - 110 mmol/L 100 103 101  CO2 20 - 31 mmol/L 23 27 26   Calcium 8.6 - 10.3 mg/dL 9.8 9.9 9.2  Total Protein 6.1 - 8.1 g/dL 7.8 7.6 -  Total Bilirubin 0.2 - 1.2 mg/dL 0.4 0.3 -  Alkaline Phos 40 - 115 U/L 128(H) 134(H) -  AST 10 - 35 U/L 15 18 -  ALT 9 - 46 U/L 18 20 -   Lipid Panel     Component Value Date/Time   CHOL 229 (H) 12/16/2015 1014   TRIG 349 (H) 12/16/2015 1014   HDL 33 (L) 12/16/2015 1014   CHOLHDL 6.9 (H) 12/16/2015 1014   VLDL 70 (H) 12/16/2015 1014   LDLCALC 126 12/16/2015 1014    CBC    Component Value Date/Time   WBC 6.2 07/09/2015 1300   RBC 4.24 07/09/2015 1300   HGB 11.6 (L) 07/09/2015 1300   HCT 36.6 (L) 07/09/2015 1300   PLT 200 07/09/2015 1300   MCV 86.3 07/09/2015 1300   MCH 27.4 07/09/2015 1300   MCHC 31.7 07/09/2015 1300   RDW 13.7 07/09/2015 1300   LYMPHSABS 1.7 04/21/2015 1102   MONOABS 0.7 04/21/2015 1102   EOSABS 0.4 04/21/2015 1102   BASOSABS 0.1 04/21/2015 1102   Results for orders placed or performed in visit on 07/08/18  POCT glucose (manual entry)  Result Value Ref Range   POC  Glucose 197 (A) 70 - 99 mg/dl  POCT glycosylated hemoglobin (Hb A1C)  Result Value Ref Range   Hemoglobin A1C     HbA1c POC (<> result, manual entry)     HbA1c, POC (prediabetic range)     HbA1c, POC (controlled diabetic range) 9.8 (A) 0.0 - 7.0 %    ASSESSMENT AND PLAN:  1. Uncontrolled type 2 diabetes mellitus with peripheral neuropathy ([place]) I have restarted Lantus and glipizide Discussed and encourage healthy eating habits. Refill given on diabetic testing supplies.  Encouraged him to check his blood sugars at least once a day and bring in readings on next visit with his PCP - POCT glucose (manual entry) - POCT glycosylated hemoglobin (Hb A1C) - Microalbumin / creatinine urine ratio - ACCU-CHEK SOFTCLIX LANCETS lancets; Use as instructed 3 times daily before meals  Dispense: 100 each; Refill: 11 - glucose blood (ACCU-CHEK AVIVA PLUS) test strip; Use as instructed  Dispense: 100 each; Refill: 12 - Insulin Pen Needle 31G X 5 MM MISC; 1 each by Does not apply route at bedtime.  Dispense: 30 each; Refill: 5 - Insulin Glargine (LANTUS SOLOSTAR) 100 UNIT/ML Solostar Pen; Inject 12 Units into the skin daily at 10 pm.  Dispense: 5 pen; Refill: 2 - glipiZIDE (GLUCOTROL) 10 MG tablet; Take 1 tablet (10 mg total) by mouth daily before breakfast.  Dispense: 30 tablet; Refill: 2 - CBC - Comprehensive metabolic panel - Lipid panel  2. Essential hypertension Blood pressure very elevated today.  Patient given a dose  of clonidine with subsequent decreased blood pressure to 180/110.  I have refilled lisinopril and nifedipine.  Low-salt diet encouraged - lisinopril (PRINIVIL,ZESTRIL) 10 MG tablet; Take 1 tablet (10 mg total) by mouth daily.  Dispense: 30 tablet; Refill: 2 - NIFEdipine (ADALAT CC) 90 MG 24 hr tablet; Take 1 tablet (90 mg total) by mouth daily.  Dispense: 30 tablet; Refill: 2 - cloNIDine (CATAPRES) tablet 0.1 mg  3. Tachycardia Patient declines examination and EKG  4. Pure  hypercholesterolemia - atorvastatin (LIPITOR) 20 MG tablet; Take 1 tablet (20 mg total) by mouth daily.  Dispense: 30 tablet; Refill: 2  5. History of medication noncompliance Encourage medication compliance.  Based on his aspect and response to questioning I suspect he has underlying mental illness  Addendum: Patient with microcytic anemia.  We will add iron studies.  Patient was given the opportunity to ask questions.  Patient verbalized understanding of the plan and was able to repeat key elements of the plan.   Orders Placed This Encounter  Procedures  . Tdap vaccine greater than or equal to 7yo IM  . Microalbumin / creatinine urine ratio  . CBC  . Comprehensive metabolic panel  . Lipid panel  . POCT glucose (manual entry)  . POCT glycosylated hemoglobin (Hb A1C)     Requested Prescriptions   Signed Prescriptions Disp Refills  . ACCU-CHEK SOFTCLIX LANCETS lancets 100 each 11    Sig: Use as instructed 3 times daily before meals  . glucose blood (ACCU-CHEK AVIVA PLUS) test strip 100 each 12    Sig: Use as instructed  . Insulin Pen Needle 31G X 5 MM MISC 30 each 5    Sig: 1 each by Does not apply route at bedtime.  Marland Kitchen lisinopril (PRINIVIL,ZESTRIL) 10 MG tablet 30 tablet 2    Sig: Take 1 tablet (10 mg total) by mouth daily.  Marland Kitchen atorvastatin (LIPITOR) 20 MG tablet 30 tablet 2    Sig: Take 1 tablet (20 mg total) by mouth daily.  Marland Kitchen NIFEdipine (ADALAT CC) 90 MG 24 hr tablet 30 tablet 2    Sig: Take 1 tablet (90 mg total) by mouth daily.  . Insulin Glargine (LANTUS SOLOSTAR) 100 UNIT/ML Solostar Pen 5 pen 2    Sig: Inject 12 Units into the skin daily at 10 pm.  . glipiZIDE (GLUCOTROL) 10 MG tablet 30 tablet 2    Sig: Take 1 tablet (10 mg total) by mouth daily before breakfast.    Return in about 5 weeks (around 08/12/2018) for Dr. Margarita Rana.  Karle Plumber, MD, FACP

## 2018-07-09 LAB — CBC
HEMATOCRIT: 36.4 % — AB (ref 37.5–51.0)
Hemoglobin: 11.6 g/dL — ABNORMAL LOW (ref 13.0–17.7)
MCH: 24.4 pg — ABNORMAL LOW (ref 26.6–33.0)
MCHC: 31.9 g/dL (ref 31.5–35.7)
MCV: 77 fL — ABNORMAL LOW (ref 79–97)
Platelets: 255 10*3/uL (ref 150–450)
RBC: 4.75 x10E6/uL (ref 4.14–5.80)
RDW: 16.9 % — ABNORMAL HIGH (ref 11.6–15.4)
WBC: 7.7 10*3/uL (ref 3.4–10.8)

## 2018-07-09 LAB — COMPREHENSIVE METABOLIC PANEL
ALBUMIN: 4.4 g/dL (ref 3.8–4.9)
ALK PHOS: 173 IU/L — AB (ref 39–117)
ALT: 11 IU/L (ref 0–44)
AST: 15 IU/L (ref 0–40)
Albumin/Globulin Ratio: 1.4 (ref 1.2–2.2)
BUN/Creatinine Ratio: 17 (ref 9–20)
BUN: 25 mg/dL — ABNORMAL HIGH (ref 6–24)
Bilirubin Total: <0.2 mg/dL (ref 0.0–1.2)
CO2: 20 mmol/L (ref 20–29)
CREATININE: 1.51 mg/dL — AB (ref 0.76–1.27)
Calcium: 9.7 mg/dL (ref 8.7–10.2)
Chloride: 106 mmol/L (ref 96–106)
GFR calc Af Amer: 59 mL/min/{1.73_m2} — ABNORMAL LOW (ref >59)
GFR calc non Af Amer: 51 mL/min/{1.73_m2} — ABNORMAL LOW (ref >59)
Globulin, Total: 3.1 g/dL (ref 1.5–4.5)
Glucose: 180 mg/dL — ABNORMAL HIGH (ref 65–99)
Potassium: 5 mmol/L (ref 3.5–5.2)
Sodium: 147 mmol/L — ABNORMAL HIGH (ref 134–144)
Total Protein: 7.5 g/dL (ref 6.0–8.5)

## 2018-07-09 LAB — LIPID PANEL
Chol/HDL Ratio: 5.8 ratio — ABNORMAL HIGH (ref 0.0–5.0)
Cholesterol, Total: 181 mg/dL (ref 100–199)
HDL: 31 mg/dL — ABNORMAL LOW (ref >39)
LDL Calculated: 95 mg/dL (ref 0–99)
Triglycerides: 276 mg/dL — ABNORMAL HIGH (ref 0–149)
VLDL Cholesterol Cal: 55 mg/dL — ABNORMAL HIGH (ref 5–40)

## 2018-07-09 LAB — MICROALBUMIN / CREATININE URINE RATIO
CREATININE, UR: 151.5 mg/dL
MICROALBUM., U, RANDOM: 238.6 ug/mL
Microalb/Creat Ratio: 157 mg/g creat — ABNORMAL HIGH (ref 0–29)

## 2018-07-09 MED FILL — glipiZIDE 10 MG TABS: 10 | 30 days supply | Qty: 30 | Fill #0

## 2018-07-09 MED FILL — ACCU-CHEK SOFTCLIX LANCETS: 33 days supply | Qty: 100 | Fill #0

## 2018-07-09 MED FILL — ATORVASTATIN 20 MG TABLET: 20 | 30 days supply | Qty: 30 | Fill #0

## 2018-07-09 MED FILL — ACCU-CHEK AVIVA PLUS STRP: 30 days supply | Qty: 100 | Fill #0

## 2018-07-09 MED FILL — LANTUS SOLOSTAR 100 UNITS/M: 100 | 25 days supply | Qty: 3 | Fill #0

## 2018-07-09 MED FILL — NIFEdipine ER OSMOTIC RELEA: 90 | 30 days supply | Qty: 30 | Fill #0

## 2018-07-09 MED FILL — TRUEPLUS PEN NDL 31GX3/16": 31G X 5 MM | 30 days supply | Qty: 100 | Fill #0

## 2018-07-09 MED FILL — TRUEPLUS PEN NDL 31GX3/16: 31G X 5 MM | 30 days supply | Qty: 100 | Fill #0

## 2018-07-10 NOTE — Addendum Note (Signed)
Addended by: Jonah Blue B on: 07/10/2018 10:29 AM   Modules accepted: Orders

## 2018-07-11 ENCOUNTER — Telehealth: Payer: Self-pay

## 2018-07-11 NOTE — Telephone Encounter (Signed)
Contacted pt to go over lab results pt phone went to busy signal tried twice   Will try again another time

## 2018-07-13 LAB — IRON,TIBC AND FERRITIN PANEL
Ferritin: 253 ng/mL (ref 30–400)
IRON SATURATION: 12 % — AB (ref 15–55)
IRON: 38 ug/dL (ref 38–169)
Total Iron Binding Capacity: 321 ug/dL (ref 250–450)
UIBC: 283 ug/dL (ref 111–343)

## 2018-07-13 LAB — SPECIMEN STATUS REPORT

## 2018-07-17 ENCOUNTER — Encounter: Payer: Medicaid Other | Admitting: Pharmacist

## 2018-08-19 ENCOUNTER — Ambulatory Visit: Payer: Medicaid Other | Admitting: Family Medicine
